# Patient Record
Sex: Female | Born: 1955
Health system: Southern US, Community
[De-identification: ages and names within clinical notes are randomized; demographics above are authoritative.]

## PROBLEM LIST (undated history)

## (undated) DIAGNOSIS — F32A Depression, unspecified: Secondary | ICD-10-CM

## (undated) DIAGNOSIS — M1990 Unspecified osteoarthritis, unspecified site: Secondary | ICD-10-CM

## (undated) DIAGNOSIS — D649 Anemia, unspecified: Secondary | ICD-10-CM

## (undated) DIAGNOSIS — Q2112 Patent foramen ovale: Secondary | ICD-10-CM

## (undated) DIAGNOSIS — M797 Fibromyalgia: Secondary | ICD-10-CM

## (undated) DIAGNOSIS — E559 Vitamin D deficiency, unspecified: Secondary | ICD-10-CM

## (undated) DIAGNOSIS — Z789 Other specified health status: Secondary | ICD-10-CM

## (undated) DIAGNOSIS — T7840XA Allergy, unspecified, initial encounter: Secondary | ICD-10-CM

## (undated) DIAGNOSIS — G43909 Migraine, unspecified, not intractable, without status migrainosus: Secondary | ICD-10-CM

## (undated) DIAGNOSIS — F329 Major depressive disorder, single episode, unspecified: Secondary | ICD-10-CM

## (undated) DIAGNOSIS — K635 Polyp of colon: Secondary | ICD-10-CM

## (undated) DIAGNOSIS — Z5189 Encounter for other specified aftercare: Secondary | ICD-10-CM

## (undated) DIAGNOSIS — G709 Myoneural disorder, unspecified: Secondary | ICD-10-CM

## (undated) DIAGNOSIS — F419 Anxiety disorder, unspecified: Secondary | ICD-10-CM

## (undated) DIAGNOSIS — Q211 Atrial septal defect: Secondary | ICD-10-CM

## (undated) DIAGNOSIS — R112 Nausea with vomiting, unspecified: Secondary | ICD-10-CM

## (undated) DIAGNOSIS — Z9889 Other specified postprocedural states: Secondary | ICD-10-CM

## (undated) DIAGNOSIS — E079 Disorder of thyroid, unspecified: Secondary | ICD-10-CM

## (undated) HISTORY — DX: Anemia, unspecified: D64.9

## (undated) HISTORY — DX: Nausea with vomiting, unspecified: Z98.890

## (undated) HISTORY — DX: Polyp of colon: K63.5

## (undated) HISTORY — DX: Allergy, unspecified, initial encounter: T78.40XA

## (undated) HISTORY — DX: Nausea with vomiting, unspecified: R11.2

## (undated) HISTORY — DX: Fibromyalgia: M79.7

## (undated) HISTORY — DX: Migraine, unspecified, not intractable, without status migrainosus: G43.909

## (undated) HISTORY — PX: ABDOMINAL HYSTERECTOMY: SHX81

## (undated) HISTORY — DX: Atrial septal defect: Q21.1

## (undated) HISTORY — PX: KNEE ARTHROSCOPY: SUR90

## (undated) HISTORY — DX: Depression, unspecified: F32.A

## (undated) HISTORY — DX: Disorder of thyroid, unspecified: E07.9

## (undated) HISTORY — DX: Other specified health status: Z78.9

## (undated) HISTORY — PX: TONSILLECTOMY: SUR1361

## (undated) HISTORY — DX: Unspecified osteoarthritis, unspecified site: M19.90

## (undated) HISTORY — DX: Anxiety disorder, unspecified: F41.9

## (undated) HISTORY — DX: Myoneural disorder, unspecified: G70.9

## (undated) HISTORY — DX: Major depressive disorder, single episode, unspecified: F32.9

## (undated) HISTORY — DX: Vitamin D deficiency, unspecified: E55.9

## (undated) HISTORY — DX: Patent foramen ovale: Q21.12

## (undated) HISTORY — DX: Encounter for other specified aftercare: Z51.89

---

## 1997-07-22 ENCOUNTER — Other Ambulatory Visit: Admission: RE | Admit: 1997-07-22 | Discharge: 1997-07-22 | Payer: Self-pay | Admitting: *Deleted

## 1997-11-24 ENCOUNTER — Emergency Department (HOSPITAL_COMMUNITY): Admission: EM | Admit: 1997-11-24 | Discharge: 1997-11-24 | Payer: Self-pay

## 1998-07-12 ENCOUNTER — Emergency Department (HOSPITAL_COMMUNITY): Admission: EM | Admit: 1998-07-12 | Discharge: 1998-07-12 | Payer: Self-pay | Admitting: Emergency Medicine

## 1999-05-12 ENCOUNTER — Ambulatory Visit (HOSPITAL_COMMUNITY): Admission: RE | Admit: 1999-05-12 | Discharge: 1999-05-12 | Payer: Self-pay | Admitting: Orthopedic Surgery

## 1999-05-12 ENCOUNTER — Encounter: Payer: Self-pay | Admitting: Orthopedic Surgery

## 1999-10-16 ENCOUNTER — Other Ambulatory Visit: Admission: RE | Admit: 1999-10-16 | Discharge: 1999-10-16 | Payer: Self-pay | Admitting: *Deleted

## 2000-10-18 ENCOUNTER — Emergency Department (HOSPITAL_COMMUNITY): Admission: EM | Admit: 2000-10-18 | Discharge: 2000-10-19 | Payer: Self-pay | Admitting: Emergency Medicine

## 2000-12-17 ENCOUNTER — Other Ambulatory Visit: Admission: RE | Admit: 2000-12-17 | Discharge: 2000-12-17 | Payer: Self-pay | Admitting: *Deleted

## 2001-04-29 ENCOUNTER — Encounter: Admission: RE | Admit: 2001-04-29 | Discharge: 2001-04-29 | Payer: Self-pay | Admitting: *Deleted

## 2003-04-15 ENCOUNTER — Emergency Department (HOSPITAL_COMMUNITY): Admission: EM | Admit: 2003-04-15 | Discharge: 2003-04-15 | Payer: Self-pay | Admitting: Emergency Medicine

## 2004-08-26 ENCOUNTER — Encounter: Admission: RE | Admit: 2004-08-26 | Discharge: 2004-08-26 | Payer: Self-pay | Admitting: Neurology

## 2005-07-04 ENCOUNTER — Other Ambulatory Visit: Admission: RE | Admit: 2005-07-04 | Discharge: 2005-07-04 | Payer: Self-pay | Admitting: *Deleted

## 2006-08-13 ENCOUNTER — Other Ambulatory Visit: Admission: RE | Admit: 2006-08-13 | Discharge: 2006-08-13 | Payer: Self-pay | Admitting: *Deleted

## 2008-02-26 HISTORY — PX: ENDOVENOUS ABLATION SAPHENOUS VEIN W/ LASER: SUR449

## 2008-07-01 ENCOUNTER — Encounter: Payer: Self-pay | Admitting: Cardiology

## 2008-07-13 ENCOUNTER — Ambulatory Visit: Payer: Self-pay

## 2008-07-13 ENCOUNTER — Ambulatory Visit: Payer: Self-pay | Admitting: Cardiology

## 2008-07-13 ENCOUNTER — Encounter: Payer: Self-pay | Admitting: Internal Medicine

## 2008-07-13 DIAGNOSIS — H53129 Transient visual loss, unspecified eye: Secondary | ICD-10-CM | POA: Insufficient documentation

## 2008-11-14 ENCOUNTER — Encounter: Payer: Self-pay | Admitting: Internal Medicine

## 2009-03-13 ENCOUNTER — Encounter (INDEPENDENT_AMBULATORY_CARE_PROVIDER_SITE_OTHER): Payer: Self-pay | Admitting: *Deleted

## 2009-03-22 ENCOUNTER — Encounter (INDEPENDENT_AMBULATORY_CARE_PROVIDER_SITE_OTHER): Payer: Self-pay | Admitting: *Deleted

## 2009-03-27 ENCOUNTER — Ambulatory Visit: Payer: Self-pay | Admitting: Internal Medicine

## 2009-04-06 ENCOUNTER — Ambulatory Visit: Payer: Self-pay | Admitting: Internal Medicine

## 2009-04-06 HISTORY — PX: COLONOSCOPY: SHX174

## 2009-04-07 ENCOUNTER — Encounter: Payer: Self-pay | Admitting: Internal Medicine

## 2009-08-09 ENCOUNTER — Encounter: Admission: RE | Admit: 2009-08-09 | Discharge: 2009-08-09 | Payer: Self-pay | Admitting: Family Medicine

## 2009-12-13 ENCOUNTER — Encounter: Payer: Self-pay | Admitting: Internal Medicine

## 2009-12-22 ENCOUNTER — Ambulatory Visit: Payer: Self-pay | Admitting: Internal Medicine

## 2009-12-22 DIAGNOSIS — R0602 Shortness of breath: Secondary | ICD-10-CM | POA: Insufficient documentation

## 2010-01-08 ENCOUNTER — Telehealth (INDEPENDENT_AMBULATORY_CARE_PROVIDER_SITE_OTHER): Payer: Self-pay | Admitting: *Deleted

## 2010-01-09 ENCOUNTER — Ambulatory Visit (HOSPITAL_COMMUNITY): Admission: RE | Admit: 2010-01-09 | Discharge: 2010-01-09 | Payer: Self-pay | Admitting: Internal Medicine

## 2010-01-09 ENCOUNTER — Ambulatory Visit: Payer: Self-pay

## 2010-01-09 ENCOUNTER — Encounter: Payer: Self-pay | Admitting: Internal Medicine

## 2010-01-09 ENCOUNTER — Ambulatory Visit: Payer: Self-pay | Admitting: Cardiovascular Disease

## 2010-01-10 ENCOUNTER — Telehealth: Payer: Self-pay | Admitting: Internal Medicine

## 2010-03-27 NOTE — Procedures (Signed)
Summary: Colonoscopy  Patient: [name] Note: All result statuses are Final unless otherwise noted.  Tests: (1) Colonoscopy (COL)   COL Colonoscopy           DONE     [facility]     [address]     [place], [place]  [address]           COLONOSCOPY PROCEDURE REPORT           PATIENT:  Kimberly Kent, Kimberly Kent  MR#:  [id number]     BIRTHDATE:  1955-04-07, 53 yrs. old  GENDER:  female           ENDOSCOPIST:  Hedwig Morton. Juanda Chance, MD     Referred by:  Robert Bellow, M.D.           PROCEDURE DATE:  04/06/2009     PROCEDURE:  Colonoscopy 19147     ASA CLASS:  Class I     INDICATIONS:  Routine Risk Screening           MEDICATIONS:   Versed 10 mg, Fentanyl 100 mcg, Benadryl 12.5 mg           DESCRIPTION OF PROCEDURE:   After the risks benefits and     alternatives of the procedure were thoroughly explained, informed     consent was obtained.  Digital rectal exam was performed and     revealed no rectal masses.   The LB CF-[id number] endoscope     was introduced through the anus and advanced to the cecum, which     was identified by both the appendix and ileocecal valve, without     limitations.  The quality of the prep was good, using MiraLax.     The instrument was then slowly withdrawn as the colon was fully     examined.     <<PROCEDUREIMAGES>>           FINDINGS:  A diminutive polyp was found in the sigmoid colon. at     20 cm 2 mm polyp The polyp was removed using cold biopsy forceps     (see image4).  This was otherwise a normal examination of the     colon (see image5, image3, image2, and image1).   Retroflexed     views in the rectum revealed no abnormalities.    The scope was     then withdrawn from the patient and the procedure completed.           COMPLICATIONS:  None           ENDOSCOPIC IMPRESSION:     1) Diminutive polyp in the sigmoid colon     2) Otherwise normal examination     RECOMMENDATIONS:     1) Await pathology results     2) high fiber diet        REPEAT EXAM:  In 10 year(s) for.           ______________________________     Hedwig Morton. Juanda Chance, MD           CC:           n.     eSIGNED:   Hedwig Morton. Brodie at 04/06/2009 10:42 AM           Iona Coach, [phone]  Note: An exclamation mark (!) indicates a result that was not dispersed into the flowsheet. Document Creation Date: 04/06/2009 10:43 AM _______________________________________________________________________  (1) Order result status: Final Collection or observation date-time: 04/06/2009  10:34 Requested date-time:  Receipt date-time:  Reported date-time:  Referring Physician:   Ordering Physician: Lina Sar [phone]) Specimen Source:  Source: [name] Order Number: [id number] Lab site:   Appended Document: Colonoscopy     Procedures Next Due Date:    Colonoscopy: 03/2019

## 2010-03-27 NOTE — Progress Notes (Signed)
Summary: echo results  Phone Note Call from Patient   Caller: Patient  [phone] Reason for Call: Talk to Nurse, Lab or Test Results Summary of Call: pt calling for echo results-pls call  Initial call taken by: Glynda Jaeger,  January 10, 2010 3:52 PM  Follow-up for Phone Call        adv pt of normal results and that Dr. Tenny Craw has not reviewed results yet. Follow-up by: Claris Gladden RN,  January 10, 2010 4:20 PM

## 2010-03-27 NOTE — Letter (Signed)
Summary: [facility] Instructions  [facility]  [address] [place], [place] [address]   Phone: [phone]  Fax: [phone]       CLARIZA SICKMAN    07-24-1955    MRN: [id number]       Procedure Day /Date:  Thursday 04/06/3009     Arrival Time:   8:30 am     Procedure Time:  9:30 am     Location of Procedure:                    _x _  [facility] (4th Floor)   PREPARATION FOR COLONOSCOPY WITH MIRALAX  Starting 5 days prior to your procedure Saturday 2/5 do not eat nuts, seeds, popcorn, corn, beans, peas,  salads, or any raw vegetables.  Do not take any fiber supplements (e.g. Metamucil, Citrucel, and Benefiber). ____________________________________________________________________________________________________   THE DAY BEFORE YOUR PROCEDURE         DATE: Wednesday 2/9  1   Drink clear liquids the entire day-NO SOLID FOOD  2   Do not drink anything colored red or purple.  Avoid juices with pulp.  No orange juice.  3   Drink at least 64 oz. (8 glasses) of fluid/clear liquids during the day to prevent dehydration and help the prep work efficiently.  CLEAR LIQUIDS INCLUDE: Water Jello Ice Popsicles Tea (sugar ok, no milk/cream) Powdered fruit flavored drinks Coffee (sugar ok, no milk/cream) Gatorade Juice: apple, white grape, white cranberry  Lemonade Clear bullion, consomm, broth Carbonated beverages (any kind) Strained chicken noodle soup Hard Candy  4   Mix the entire bottle of Miralax with 64 oz. of Gatorade/Powerade in the morning and put in the refrigerator to chill.  5   At 3:00 pm take 2 Dulcolax/Bisacodyl tablets.  6   At 4:30 pm take one Reglan/Metoclopramide tablet.  7  Starting at 5:00 pm drink one 8 oz glass of the Miralax mixture every 15-20 minutes until you have finished drinking the entire 64 oz.  You should finish drinking prep around 7:30 or 8:00 pm.  8   If you are nauseated, you may take the 2nd Reglan/Metoclopramide  tablet at 6:30 pm.        9    At 8:00 pm take 2 more DULCOLAX/Bisacodyl tablets.     THE DAY OF YOUR PROCEDURE      DATE:  Thursday 2/10  You may drink clear liquids until 7:30 am  (2 HOURS BEFORE PROCEDURE).   MEDICATION INSTRUCTIONS  Unless otherwise instructed, you should take regular prescription medications with a small sip of water as early as possible the morning of your procedure.         OTHER INSTRUCTIONS  You will need a responsible adult at least 53 years of age to accompany you and drive you home.   This person must remain in the waiting room during your procedure.  Wear loose fitting clothing that is easily removed.  Leave jewelry and other valuables at home.  However, you may wish to bring a book to read or an iPod/MP3 player to listen to music as you wait for your procedure to start.  Remove all body piercing jewelry and leave at home.  Total time from sign-in until discharge is approximately 2-3 hours.  You should go home directly after your procedure and rest.  You can resume normal activities the day after your procedure.  The day of your procedure you should not:   Drive  Make legal decisions   Operate machinery   Drink alcohol   Return to work  You will receive specific instructions about eating, activities and medications before you leave.   The above instructions have been reviewed and explained to me by   Ezra Sites RN  March 27, 2009 2:48 PM     I fully understand and can verbalize these instructions _____________________________ Date _______

## 2010-03-27 NOTE — Assessment & Plan Note (Signed)
Summary: ec6/sob   History of Present Illness: Patient is a  55 year old wh was referred for evaluation of SOB. the patient has no known CAD.  She says over the last several months she has noticed increased SOB with activity, decreased exercise tolerance.  She is active as a cyclist and has not been able to bike as long without feeling winded.  She denies chest pains.  Does note about a 30 lb. increase in wt over the past 18 months. Notes occasional selling in R shoulder and R arm.  Current Medications (verified): 1)  Prozac 10 Mg Caps (Fluoxetine Hcl) .... Daily 2)  Vitamin D 1000 Unit  Tabs (Cholecalciferol) .... Daily 3)  Multivitamins   Tabs (Multiple Vitamin) .... Daily 4)  Synthroid 75 Mcg Tabs (Levothyroxine Sodium) .... Once Daily 5)  Celebrex 200 Mg Caps (Celecoxib) .... As Needed  Allergies: 1)  ! Codeine 2)  ! Wellbutrin  Past History:  Past Surgical History: Last updated: 12/21/2009 Knee surgery Partial Hysterectomy  Family History: Last updated: 07/13/2008 negative for cardiovascular disease  Social History: Last updated: 07/13/2008 She works as a Software engineer for [employer]. She is single. She is an avid biker. She does not smoke.  Past Medical History: Hypothyroid. Depression. DJD  Review of Systems       ALl systems reviewed.  Neg to the above problem excep as noted above.  Vital Signs:  Patient profile:   55 year old female Height:      66 inches Weight:      193 pounds BMI:     31.26 Pulse rate:   64 / minute BP sitting:   106 / 70  (left arm)  Vitals Entered By: Laurance Flatten CMA (December 22, 2009 2:06 PM)  Physical Exam  Additional Exam:  patient is in NAD. HEENT:  Normocephalic, atraumatic. EOMI, PERRLA.  Neck: JVP is normal. No thyromegaly. No bruits.  Lungs: clear to auscultation. No rales no wheezes.  Heart: Regular rate and rhythm. Normal S1, S2. No S3.   No significant murmurs. PMI not displaced.  Abdomen:  Supple, nontender.  Normal bowel sounds. No masses. No hepatomegaly.  Extremities:   Good distal pulses throughout. No lower extremity edema.  Musculoskeletal :moving all extremities.  Neuro:   alert and oriented x3.    EKG  Procedure date:  12/13/2009  Findings:      NSR.  64 bpm.    Impression & Recommendations:  Problem # 1:  SHORTNESS OF BREATH (ICD-786.05) Patient with no known CAD.  Is moderately active.  Now with increased dyspnea with exertion.   I would recommend an echo and a stress echo to evaluate.  Continue activities as tolerated.  Discussed wt loss.  Other Orders: Echocardiogram (Echo) Stress Echo (Stress Echo)  Patient Instructions: 1)  Your physician has requested that you have a stress echocardiogram. For further information please visit [email or web address]  Please follow instruction sheet as given. 2)  Your physician has requested that you have an echocardiogram.  Echocardiography is a painless test that uses sound waves to create images of your heart. It provides your doctor with information about the size and shape of your heart and how well your heart's chambers and valves are working.  This procedure takes approximately one hour. There are no restrictions for this procedure.  Appended Document: ec6/sob Dyslipidemia:  :D: 126, HDL 60.  Discussed diet.  Should f/u with primary MD  Allergies:  Codeine:  sick;  Wellbutrin:  hives

## 2010-03-27 NOTE — Letter (Signed)
Summary: Previsit letter  [facility]  [address] [place], [place] [address]   Phone: [phone]  Fax: [phone]       03/13/2009 MRN: [id number]  [facility] [address] [place], [place]  [address]  Dear Ms. Hain,  Welcome to the Gastroenterology Division at [facility].    You are scheduled to see a nurse for your pre-procedure visit on 03-27-09 at 2:30p.m. on the 3rd floor at [facility], [address]  We ask that you try to arrive at our office 15 minutes prior to your appointment time to allow for check-in.  Your nurse visit will consist of discussing your medical and surgical history, your immediate family medical history, and your medications.    Please bring a complete list of all your medications or, if you prefer, bring the medication bottles and we will list them.  We will need to be aware of both prescribed and over the counter drugs.  We will need to know exact dosage information as well.  If you are on blood thinners (Coumadin, Plavix, Aggrenox, Ticlid, etc.) please call our office today/prior to your appointment, as we need to consult with your physician about holding your medication.   Please be prepared to read and sign documents such as consent forms, a financial agreement, and acknowledgement forms.  If necessary, and with your consent, a friend or relative is welcome to sit-in on the nurse visit with you.  Please bring your insurance card so that we may make a copy of it.  If your insurance requires a referral to see a specialist, please bring your referral form from your primary care physician.  No co-pay is required for this nurse visit.     If you cannot keep your appointment, please call [phone] to cancel or reschedule prior to your appointment date.  This allows [place] the opportunity to schedule an appointment for another patient in need of care.    Thank you for choosing [facility] for your medical needs.  We  appreciate the opportunity to care for you.  Please visit [place] at our website  to learn more about our practice.                     Sincerely.                                                                                                                   The Gastroenterology Division

## 2010-03-27 NOTE — Miscellaneous (Signed)
Summary: [facility]  Clinical Lists Changes  Medications: Added new medication of MIRALAX   POWD (POLYETHYLENE GLYCOL 3350) As per prep  instructions. - Signed Added new medication of REGLAN 10 MG  TABS (METOCLOPRAMIDE HCL) As per prep instructions. - Signed Added new medication of DULCOLAX 5 MG  TBEC (BISACODYL) Day before procedure take 2 at 3pm and 2 at 8pm. - Signed Rx of MIRALAX   POWD (POLYETHYLENE GLYCOL 3350) As per prep  instructions.;  #255gm x 0;  Signed;  Entered by: Ezra Sites RN;  Authorized by: Hart Carwin MD;  Method used: [facility] [phone]*, [address], [place], [place], [place]  [address], Ph: [phone], Fax: [phone] Rx of REGLAN 10 MG  TABS (METOCLOPRAMIDE HCL) As per prep instructions.;  #2 x 0;  Signed;  Entered by: Ezra Sites RN;  Authorized by: Hart Carwin MD;  Method used: Electronically to [facility] [phone]*, [address], [place], [place], [place]  [address], Ph: [phone], Fax: [phone] Rx of DULCOLAX 5 MG  TBEC (BISACODYL) Day before procedure take 2 at 3pm and 2 at 8pm.;  #4 x 0;  Signed;  Entered by: Ezra Sites RN;  Authorized by: Hart Carwin MD;  Method used: Electronically to [facility] [phone]*, [address], [place], [place], [place]  [address], Ph: [phone], Fax: [phone] Allergies: Added new allergy or adverse reaction of CODEINE Added new allergy or adverse reaction of WELLBUTRIN Observations: Added new observation of NKA: F (03/27/2009 14:16)    Prescriptions: DULCOLAX 5 MG  TBEC (BISACODYL) Day before procedure take 2 at 3pm and 2 at 8pm.  #4 x 0   Entered by:   Ezra Sites RN   Authorized by:   Hart Carwin MD   Signed by:   Ezra Sites RN on 03/27/2009   Method used:   Electronically to        [facility] [phone]* (retail)       [address]       [place], [place]  [address]       Ph: [phone]       Fax: [phone]  RxID:   [id number] REGLAN 10 MG  TABS (METOCLOPRAMIDE HCL) As per prep instructions.  #2 x 0   Entered by:   Ezra Sites RN   Authorized by:   Hart Carwin MD   Signed by:   Ezra Sites RN on 03/27/2009   Method used:   Electronically to        [facility] [phone]* (retail)       [address]       [place], [place]  [address]       Ph: [phone]       Fax: [phone]   RxID:   [id number] MIRALAX   POWD (POLYETHYLENE GLYCOL 3350) As per prep  instructions.  #255gm x 0   Entered by:   Ezra Sites RN   Authorized by:   Hart Carwin MD   Signed by:   Ezra Sites RN on 03/27/2009   Method used:   Electronically to        [facility] [phone]* (retail)       [address]       [place], [place]  [address]  Ph: [phone]       Fax: [phone]   RxID:   [id number]

## 2010-03-27 NOTE — Progress Notes (Signed)
Summary: Echo pre procedure  Phone Note Outgoing Call Call back at [facility] Phone [phone]   Call placed by: Rea College, CMA,  January 08, 2010 3:12 PM Call placed to: Patient Summary of Call: Kimberly Kent left message with instructions for stress echo on Tuesday, 01/09/10.

## 2010-03-27 NOTE — Letter (Signed)
Summary: Patient Notice- Polyp Results  [facility]  [address] [place], [place] [address]   Phone: [phone]  Fax: [phone]        April 07, 2009 MRN: [id number]    [facility] [address] [place], [place]  [address]    Dear Ms. Caplin,  I am pleased to inform you that the colon polyp(s) removed during your recent colonoscopy was (were) found to be benign (no cancer detected) upon pathologic examination.The polyp was hyperplastic ( not premalignant)  I recommend you have a repeat colonoscopy examination in _10 years to look for recurrent polyps, as having colon polyps increases your risk for having recurrent polyps or even colon cancer in the future.  Should you develop new or worsening symptoms of abdominal pain, bowel habit changes or bleeding from the rectum or bowels, please schedule an evaluation with either your primary care physician or with me.  Additional information/recommendations:  _x_ No further action with gastroenterology is needed at this time. Please      follow-up with your primary care physician for your other healthcare      needs.  __ Please call [phone] to schedule a return visit to review your      situation.  __ Please keep your follow-up visit as already scheduled.  __ Continue treatment plan as outlined the day of your exam.  Please call us if you are having persistent problems or have questions about your condition that have not been fully answered at this time.  Sincerely,  Hart [place] MD  This letter has been electronically signed by your physician.  Appended Document: Patient Notice- Polyp Results Letter mailed 2.16.11

## 2010-06-08 ENCOUNTER — Other Ambulatory Visit: Payer: Self-pay | Admitting: Physical Medicine and Rehabilitation

## 2010-06-08 DIAGNOSIS — M545 Low back pain, unspecified: Secondary | ICD-10-CM

## 2010-06-09 ENCOUNTER — Ambulatory Visit
Admission: RE | Admit: 2010-06-09 | Discharge: 2010-06-09 | Disposition: A | Discharge status: Home / Self Care | Payer: BLUE CROSS/BLUE SHIELD | Source: Ambulatory Visit | Attending: Physical Medicine and Rehabilitation | Admitting: Physical Medicine and Rehabilitation

## 2010-06-09 DIAGNOSIS — M545 Low back pain, unspecified: Secondary | ICD-10-CM

## 2010-09-28 ENCOUNTER — Other Ambulatory Visit: Payer: Self-pay | Admitting: Internal Medicine

## 2010-10-04 ENCOUNTER — Ambulatory Visit
Admission: RE | Admit: 2010-10-04 | Discharge: 2010-10-04 | Disposition: A | Discharge status: Home / Self Care | Payer: BLUE CROSS/BLUE SHIELD | Source: Ambulatory Visit | Attending: Internal Medicine | Admitting: Internal Medicine

## 2010-10-04 MED ORDER — IOHEXOL 300 MG/ML  SOLN
100.0000 mL | Freq: Once | INTRAMUSCULAR | Status: AC | PRN
  Administered 2010-10-04: 100 mL via INTRAVENOUS

## 2010-10-22 ENCOUNTER — Other Ambulatory Visit: Payer: Self-pay | Admitting: Gynecology

## 2010-12-17 ENCOUNTER — Other Ambulatory Visit: Payer: Self-pay | Admitting: Dermatology

## 2011-01-02 ENCOUNTER — Other Ambulatory Visit: Payer: Self-pay | Admitting: Gynecology

## 2011-02-21 ENCOUNTER — Ambulatory Visit: Payer: BLUE CROSS/BLUE SHIELD

## 2011-04-04 ENCOUNTER — Other Ambulatory Visit: Payer: Self-pay | Admitting: Physician Assistant

## 2011-04-04 MED ORDER — FLUOXETINE HCL 10 MG PO TABS: 10.0000 mg | ORAL_TABLET | ORAL | Status: DC

## 2011-04-22 ENCOUNTER — Other Ambulatory Visit: Payer: Self-pay | Admitting: Dermatology

## 2011-05-04 ENCOUNTER — Other Ambulatory Visit: Payer: Self-pay | Admitting: Internal Medicine

## 2011-05-05 NOTE — Telephone Encounter (Signed)
Partial fill synthroid needs OV

## 2011-05-20 ENCOUNTER — Encounter: Payer: Self-pay | Admitting: Internal Medicine

## 2011-05-20 ENCOUNTER — Ambulatory Visit (INDEPENDENT_AMBULATORY_CARE_PROVIDER_SITE_OTHER): Payer: PRIVATE HEALTH INSURANCE | Admitting: Internal Medicine

## 2011-05-20 DIAGNOSIS — M79604 Pain in right leg: Secondary | ICD-10-CM

## 2011-05-20 DIAGNOSIS — R5381 Other malaise: Secondary | ICD-10-CM

## 2011-05-20 DIAGNOSIS — R5383 Other fatigue: Secondary | ICD-10-CM

## 2011-05-20 DIAGNOSIS — E039 Hypothyroidism, unspecified: Secondary | ICD-10-CM

## 2011-05-20 DIAGNOSIS — M545 Low back pain: Secondary | ICD-10-CM

## 2011-05-20 DIAGNOSIS — G43009 Migraine without aura, not intractable, without status migrainosus: Secondary | ICD-10-CM

## 2011-05-20 DIAGNOSIS — M7989 Other specified soft tissue disorders: Secondary | ICD-10-CM

## 2011-05-20 DIAGNOSIS — M25429 Effusion, unspecified elbow: Secondary | ICD-10-CM

## 2011-05-20 DIAGNOSIS — R05 Cough: Secondary | ICD-10-CM

## 2011-05-20 DIAGNOSIS — R209 Unspecified disturbances of skin sensation: Secondary | ICD-10-CM

## 2011-05-20 DIAGNOSIS — R059 Cough, unspecified: Secondary | ICD-10-CM

## 2011-05-20 DIAGNOSIS — G43109 Migraine with aura, not intractable, without status migrainosus: Secondary | ICD-10-CM | POA: Insufficient documentation

## 2011-05-20 LAB — CBC WITH DIFFERENTIAL/PLATELET
Basophils Relative: 0 % (ref 0–1)
Eosinophils Absolute: 0.1 10*3/uL (ref 0.0–0.7)
HCT: 38.4 % (ref 36.0–46.0)
Hemoglobin: 12.5 g/dL (ref 12.0–15.0)
Lymphs Abs: 1.6 10*3/uL (ref 0.7–4.0)
MCH: 30.2 pg (ref 26.0–34.0)
MCHC: 32.6 g/dL (ref 30.0–36.0)
Monocytes Absolute: 0.3 10*3/uL (ref 0.1–1.0)
Monocytes Relative: 6 % (ref 3–12)
RBC: 4.14 MIL/uL (ref 3.87–5.11)

## 2011-05-20 LAB — COMPREHENSIVE METABOLIC PANEL
ALT: 14 U/L (ref 0–35)
CO2: 30 mEq/L (ref 19–32)
Calcium: 9.7 mg/dL (ref 8.4–10.5)
Chloride: 104 mEq/L (ref 96–112)
Creat: 0.61 mg/dL (ref 0.50–1.10)
Glucose, Bld: 85 mg/dL (ref 70–99)
Sodium: 143 mEq/L (ref 135–145)
Total Bilirubin: 0.5 mg/dL (ref 0.3–1.2)
Total Protein: 6.8 g/dL (ref 6.0–8.3)

## 2011-05-20 LAB — TSH: TSH: 0.768 u[IU]/mL (ref 0.350–4.500)

## 2011-05-20 MED ORDER — METHYLPREDNISOLONE ACETATE 80 MG/ML IJ SUSP
80.0000 mg | Freq: Once | INTRAMUSCULAR | Status: AC
  Administered 2011-05-20: 80 mg via INTRAMUSCULAR

## 2011-05-20 MED ORDER — AZITHROMYCIN 250 MG PO TABS: ORAL_TABLET | ORAL | Status: AC

## 2011-05-20 MED ORDER — SYNTHROID 75 MCG PO TABS: 75.0000 ug | ORAL_TABLET | Freq: Every day | ORAL | Status: DC

## 2011-05-20 NOTE — Progress Notes (Signed)
  Subjective:    Patient ID: Kimberly Kent, female    DOB: [birth year], 56 y.o.   MRN: [id number]  HPI LBP right rad Cough with green sputum Fatigue-- needs thyroid ck Swelling and numbness arms Migrain     Review of Systems Stable otherwise    Objective:   Physical Exam        Assessment & Plan:  See Dr. Sandria Manly about numbness and migrain

## 2011-05-23 ENCOUNTER — Encounter: Payer: Self-pay | Admitting: Radiology

## 2011-09-02 ENCOUNTER — Telehealth: Payer: Self-pay

## 2011-09-02 DIAGNOSIS — G43909 Migraine, unspecified, not intractable, without status migrainosus: Secondary | ICD-10-CM

## 2011-09-02 NOTE — Telephone Encounter (Signed)
Can we do this ?

## 2011-09-02 NOTE — Telephone Encounter (Signed)
Referral made to [facility]

## 2011-09-02 NOTE — Telephone Encounter (Signed)
Pt would like referral to [facility] has seen dr guest in the past for her headaches.

## 2011-09-03 NOTE — Telephone Encounter (Signed)
Pt notified. Jenna 

## 2011-09-15 ENCOUNTER — Other Ambulatory Visit: Payer: Self-pay | Admitting: Physician Assistant

## 2011-10-23 ENCOUNTER — Other Ambulatory Visit: Payer: Self-pay | Admitting: Gynecology

## 2011-12-05 ENCOUNTER — Other Ambulatory Visit: Payer: Self-pay | Admitting: Physician Assistant

## 2011-12-12 ENCOUNTER — Other Ambulatory Visit: Payer: Self-pay | Admitting: Physician Assistant

## 2012-01-09 ENCOUNTER — Other Ambulatory Visit: Payer: Self-pay | Admitting: Physician Assistant

## 2012-01-09 ENCOUNTER — Telehealth: Payer: Self-pay

## 2012-01-09 MED ORDER — FLUOXETINE HCL 10 MG PO CAPS: 10.0000 mg | ORAL_CAPSULE | Freq: Every day | ORAL | Status: DC

## 2012-01-09 NOTE — Telephone Encounter (Signed)
Patient advised.

## 2012-01-09 NOTE — Telephone Encounter (Signed)
Rx sent to pharmacy. Must keep appointment with Dr. Perrin Maltese.

## 2012-01-09 NOTE — Telephone Encounter (Signed)
PATIENT STATES SHE NEEDS TO GET A FEW PILLS CALLED INTO HER PHARMACY. SHE NEEDS TO HAVE HER FLUOXETINE HCL 10MG . WE COULD  NOT GET HER IN FOR HER APPOINTMENT TO SEE DR. Perrin Maltese UNTIL THE END OF THIS MONTH, BUT SHE ONLY HAS 3 PILLS LEFT. SHE JUST NEEDS ENOUGH TO MAKE IT UNTIL THEN. BEST PHONE [phone]   [facility])   MBC

## 2012-01-20 ENCOUNTER — Encounter: Payer: Self-pay | Admitting: Internal Medicine

## 2012-01-20 ENCOUNTER — Ambulatory Visit: Payer: PRIVATE HEALTH INSURANCE | Admitting: Internal Medicine

## 2012-01-20 MED ORDER — SYNTHROID 75 MCG PO TABS: 75.0000 ug | ORAL_TABLET | Freq: Every day | ORAL | Status: DC

## 2012-01-20 MED ORDER — FLUOXETINE HCL 10 MG PO CAPS: 10.0000 mg | ORAL_CAPSULE | Freq: Every day | ORAL | Status: DC

## 2012-01-20 NOTE — Progress Notes (Signed)
  Subjective:    Patient ID: Kimberly Kent, female    DOB: October 21, 1955, 56 y.o.   MRN: [id number]  HPI    Review of Systems     Objective:   Physical Exam        Assessment & Plan:  RF meds only, had to leave

## 2012-02-03 ENCOUNTER — Other Ambulatory Visit: Payer: Self-pay | Admitting: Physician Assistant

## 2012-03-05 ENCOUNTER — Ambulatory Visit (INDEPENDENT_AMBULATORY_CARE_PROVIDER_SITE_OTHER): Payer: PRIVATE HEALTH INSURANCE | Admitting: Internal Medicine

## 2012-03-05 VITALS — BP 106/71 | HR 74 | Temp 98.0°F | Resp 16 | Ht 66.0 in | Wt 197.6 lb

## 2012-03-05 DIAGNOSIS — F419 Anxiety disorder, unspecified: Secondary | ICD-10-CM

## 2012-03-05 DIAGNOSIS — F39 Unspecified mood [affective] disorder: Secondary | ICD-10-CM

## 2012-03-05 DIAGNOSIS — E039 Hypothyroidism, unspecified: Secondary | ICD-10-CM

## 2012-03-05 DIAGNOSIS — F411 Generalized anxiety disorder: Secondary | ICD-10-CM

## 2012-03-05 DIAGNOSIS — Z0000 Encounter for general adult medical examination without abnormal findings: Secondary | ICD-10-CM

## 2012-03-05 DIAGNOSIS — M2550 Pain in unspecified joint: Secondary | ICD-10-CM

## 2012-03-05 LAB — POCT UA - MICROSCOPIC ONLY
Bacteria, U Microscopic: NEGATIVE
Casts, Ur, LPF, POC: NEGATIVE
Crystals, Ur, HPF, POC: NEGATIVE
Mucus, UA: NEGATIVE
Yeast, UA: NEGATIVE

## 2012-03-05 LAB — POCT CBC
Lymph, poc: 1.7 (ref 0.6–3.4)
MCH, POC: 30.2 pg (ref 27–31.2)
MCHC: 32 g/dL (ref 31.8–35.4)
MCV: 94.5 fL (ref 80–97)
MID (cbc): 0.5 (ref 0–0.9)
MPV: 8.2 fL (ref 0–99.8)
POC LYMPH PERCENT: 32.9 %L (ref 10–50)
POC MID %: 8.5 %M (ref 0–12)
Platelet Count, POC: 314 10*3/uL (ref 142–424)
WBC: 5.3 10*3/uL (ref 4.6–10.2)

## 2012-03-05 LAB — POCT URINALYSIS DIPSTICK
Bilirubin, UA: NEGATIVE
Ketones, UA: NEGATIVE
Leukocytes, UA: NEGATIVE
Nitrite, UA: NEGATIVE
Protein, UA: NEGATIVE
pH, UA: 8

## 2012-03-05 LAB — COMPREHENSIVE METABOLIC PANEL
ALT: 15 U/L (ref 0–35)
AST: 19 U/L (ref 0–37)
Alkaline Phosphatase: 69 U/L (ref 39–117)
Creat: 0.68 mg/dL (ref 0.50–1.10)
Sodium: 138 mEq/L (ref 135–145)
Total Bilirubin: 0.7 mg/dL (ref 0.3–1.2)
Total Protein: 6.9 g/dL (ref 6.0–8.3)

## 2012-03-05 LAB — LIPID PANEL
Cholesterol: 239 mg/dL — ABNORMAL HIGH (ref 0–200)
LDL Cholesterol: 164 mg/dL — ABNORMAL HIGH (ref 0–99)
Total CHOL/HDL Ratio: 3.9 Ratio
VLDL: 14 mg/dL (ref 0–40)

## 2012-03-05 LAB — VITAMIN D 25 HYDROXY (VIT D DEFICIENCY, FRACTURES): Vit D, 25-Hydroxy: 24 ng/mL — ABNORMAL LOW (ref 30–89)

## 2012-03-05 MED ORDER — SYNTHROID 75 MCG PO TABS: 75.0000 ug | ORAL_TABLET | Freq: Every day | ORAL | Status: DC

## 2012-03-05 MED ORDER — FLUOXETINE HCL 10 MG PO CAPS: 10.0000 mg | ORAL_CAPSULE | Freq: Every day | ORAL | Status: DC

## 2012-03-05 NOTE — Progress Notes (Signed)
Subjective:    Patient ID: Kimberly Kent, female    DOB: Jul 05, 1955, 57 y.o.   MRN: [id number]  HPI CPE today Feels good , no changes  Review of Systems  Constitutional: Negative.   HENT: Negative.   Eyes: Negative.   Respiratory: Negative.   Cardiovascular: Negative.   Gastrointestinal: Negative.   Genitourinary: Negative.   Musculoskeletal: Positive for back pain and arthralgias.  Neurological: Positive for dizziness.  Hematological: Negative.   Psychiatric/Behavioral: Negative.        Objective:   Physical Exam  Vitals reviewed. Constitutional: She is oriented to person, place, and time. She appears well-developed and well-nourished.  HENT:  Head: Normocephalic.  Right Ear: External ear normal.  Left Ear: External ear normal.  Nose: Nose normal.  Mouth/Throat: Oropharynx is clear and moist.  Eyes: EOM are normal. Pupils are equal, round, and reactive to light. No scleral icterus.  Neck: Normal range of motion. Neck supple. No thyromegaly present.  Cardiovascular: Normal rate, normal heart sounds and intact distal pulses.   Pulmonary/Chest: Effort normal.  Abdominal: Soft. She exhibits no mass. There is no tenderness.  Musculoskeletal: Normal range of motion. She exhibits tenderness.  Neurological: She is alert and oriented to person, place, and time. She has normal reflexes. No cranial nerve deficit. She exhibits normal muscle tone. Coordination normal.  Skin: No rash noted.  Psychiatric: She has a normal mood and affect. Her behavior is normal. Judgment and thought content normal.     Results for orders placed in visit on 03/05/12  POCT CBC      Component Value Range   WBC 5.3  4.6 - 10.2 K/uL   Lymph, poc 1.7  0.6 - 3.4   POC LYMPH PERCENT 32.9  10 - 50 %L   MID (cbc) 0.5  0 - 0.9   POC MID % 8.5  0 - 12 %M   POC Granulocyte 3.1  2 - 6.9   Granulocyte percent 58.6  37 - 80 %G   RBC 4.37  4.04 - 5.48 M/uL   Hemoglobin 13.2  12.2 - 16.2 g/dL   HCT, POC 11.9   14.7 - 47.9 %   MCV 94.5  80 - 97 fL   MCH, POC 30.2  27 - 31.2 pg   MCHC 32.0  31.8 - 35.4 g/dL   RDW, POC 82.9     Platelet Count, POC 314  142 - 424 K/uL   MPV 8.2  0 - 99.8 fL  GLUCOSE, POCT (MANUAL RESULT ENTRY)      Component Value Range   POC Glucose 80  70 - 99 mg/dl  POCT GLYCOSYLATED HEMOGLOBIN (HGB A1C)      Component Value Range   Hemoglobin A1C 5.4    POCT URINALYSIS DIPSTICK      Component Value Range   Color, UA yellow     Clarity, UA clear     Glucose, UA neg     Bilirubin, UA neg     Ketones, UA neg     Spec Grav, UA 1.020     Blood, UA trace     pH, UA 8.0     Protein, UA neg     Urobilinogen, UA 1.0     Nitrite, UA neg     Leukocytes, UA Negative    POCT UA - MICROSCOPIC ONLY      Component Value Range   WBC, Ur, HPF, POC neg     RBC, urine, microscopic 0-2  Bacteria, U Microscopic neg     Mucus, UA neg     Epithelial cells, urine per micros 0-1     Crystals, Ur, HPF, POC neg     Casts, Ur, LPF, POC neg     Yeast, UA neg     ekg ok     Assessment & Plan:  Healthy

## 2012-03-05 NOTE — Patient Instructions (Signed)

## 2012-03-11 ENCOUNTER — Telehealth: Payer: Self-pay

## 2012-03-11 NOTE — Telephone Encounter (Signed)
Pt called wanting to know lab results from last Thursday. Pt stated this is 3rd time she has called. Best # for pt [phone]

## 2012-03-12 NOTE — Telephone Encounter (Signed)
Spoke with patient and notified her of her labs. In the result note Dr. Perrin Maltese wrote labs abnormal cholesterol too high and vit D too low. She knows what to do. When I spoke with patient she said he didn't discuss what she would need to do. Does he want her to take OTC Vit D or should it be RX? As far as cholesterol she said she knew as far as exercise and diet cut out fried fatty foods but should she be doing anything else or take anything for this. She stated it has never been high or this high before. Please advise?

## 2012-03-13 NOTE — Telephone Encounter (Signed)
Pt  # disconnected. Mailed letter.

## 2012-03-13 NOTE — Telephone Encounter (Signed)
Vit D 2000units per day Cholesterol we need to consider taking medication iff weight loss does not work

## 2012-04-13 ENCOUNTER — Encounter: Payer: PRIVATE HEALTH INSURANCE | Admitting: Internal Medicine

## 2012-06-01 ENCOUNTER — Other Ambulatory Visit: Payer: Self-pay | Admitting: Internal Medicine

## 2012-06-28 ENCOUNTER — Ambulatory Visit (INDEPENDENT_AMBULATORY_CARE_PROVIDER_SITE_OTHER): Payer: PRIVATE HEALTH INSURANCE | Admitting: Internal Medicine

## 2012-06-28 ENCOUNTER — Ambulatory Visit: Payer: PRIVATE HEALTH INSURANCE

## 2012-06-28 VITALS — BP 126/85 | HR 65 | Temp 98.0°F | Resp 16 | Ht 66.0 in | Wt 198.0 lb

## 2012-06-28 DIAGNOSIS — E785 Hyperlipidemia, unspecified: Secondary | ICD-10-CM

## 2012-06-28 DIAGNOSIS — E559 Vitamin D deficiency, unspecified: Secondary | ICD-10-CM

## 2012-06-28 DIAGNOSIS — M545 Low back pain, unspecified: Secondary | ICD-10-CM

## 2012-06-28 DIAGNOSIS — R5383 Other fatigue: Secondary | ICD-10-CM

## 2012-06-28 DIAGNOSIS — F419 Anxiety disorder, unspecified: Secondary | ICD-10-CM

## 2012-06-28 DIAGNOSIS — G4700 Insomnia, unspecified: Secondary | ICD-10-CM

## 2012-06-28 DIAGNOSIS — R300 Dysuria: Secondary | ICD-10-CM

## 2012-06-28 DIAGNOSIS — E0590 Thyrotoxicosis, unspecified without thyrotoxic crisis or storm: Secondary | ICD-10-CM

## 2012-06-28 DIAGNOSIS — R209 Unspecified disturbances of skin sensation: Secondary | ICD-10-CM

## 2012-06-28 DIAGNOSIS — R202 Paresthesia of skin: Secondary | ICD-10-CM

## 2012-06-28 LAB — POCT CBC
Granulocyte percent: 58.6 % (ref 37–80)
HCT, POC: 38.9 % (ref 37.7–47.9)
Hemoglobin: 12.2 g/dL (ref 12.2–16.2)
Lymph, poc: 1.9 (ref 0.6–3.4)
MCH, POC: 29.4 pg (ref 27–31.2)
MCHC: 31.4 g/dL — AB (ref 31.8–35.4)
MCV: 93.7 fL (ref 80–97)
MID (cbc): 0.4 (ref 0–0.9)
MPV: 7.9 fL (ref 0–99.8)
POC Granulocyte: 3.3 (ref 2–6.9)
POC LYMPH PERCENT: 33.5 % (ref 10–50)
POC MID %: 7.9 % (ref 0–12)
Platelet Count, POC: 275 K/uL (ref 142–424)
RBC: 4.15 M/uL (ref 4.04–5.48)
RDW, POC: 12.2 %
WBC: 5.6 K/uL (ref 4.6–10.2)

## 2012-06-28 LAB — POCT URINALYSIS DIPSTICK
Bilirubin, UA: NEGATIVE
Glucose, UA: NEGATIVE
Ketones, UA: NEGATIVE
Leukocytes, UA: NEGATIVE
Spec Grav, UA: <=1.005

## 2012-06-28 LAB — POCT SEDIMENTATION RATE: POCT SED RATE: 45 mm/hr — AB (ref 0–22)

## 2012-06-28 LAB — POCT UA - MICROSCOPIC ONLY
Bacteria, U Microscopic: NEGATIVE
Casts, Ur, LPF, POC: NEGATIVE
Crystals, Ur, HPF, POC: NEGATIVE
Mucus, UA: NEGATIVE
WBC, Ur, HPF, POC: NEGATIVE
Yeast, UA: NEGATIVE

## 2012-06-28 LAB — VITAMIN B12: Vitamin B-12: 448 pg/mL (ref 211–911)

## 2012-06-28 LAB — TSH: TSH: 1.033 u[IU]/mL (ref 0.350–4.500)

## 2012-06-28 LAB — GLUCOSE, POCT (MANUAL RESULT ENTRY): POC Glucose: 71 mg/dl (ref 70–99)

## 2012-06-28 MED ORDER — ALPRAZOLAM 0.25 MG PO TABS: 0.2500 mg | ORAL_TABLET | Freq: Two times a day (BID) | ORAL | Status: DC | PRN

## 2012-06-28 MED ORDER — FLUOXETINE HCL 20 MG PO TABS: 20.0000 mg | ORAL_TABLET | Freq: Every day | ORAL | Status: DC

## 2012-06-28 NOTE — Patient Instructions (Signed)
Insomnia Insomnia is frequent trouble falling and/or staying asleep. Insomnia can be a long term problem or a short term problem. Both are common. Insomnia can be a short term problem when the wakefulness is related to a certain stress or worry. Long term insomnia is often related to ongoing stress during waking hours and/or poor sleeping habits. Overtime, sleep deprivation itself can make the problem worse. Every little thing feels more severe because you are overtired and your ability to cope is decreased. CAUSES   Stress, anxiety, and depression.  Poor sleeping habits.  Distractions such as TV in the bedroom.  Naps close to bedtime.  Engaging in emotionally charged conversations before bed.  Technical reading before sleep.  Alcohol and other sedatives. They may make the problem worse. They can hurt normal sleep patterns and normal dream activity.  Stimulants such as caffeine for several hours prior to bedtime.  Pain syndromes and shortness of breath can cause insomnia.  Exercise late at night.  Changing time zones may cause sleeping problems (jet lag). It is sometimes helpful to have someone observe your sleeping patterns. They should look for periods of not breathing during the night (sleep apnea). They should also look to see how long those periods last. If you live alone or observers are uncertain, you can also be observed at a sleep clinic where your sleep patterns will be professionally monitored. Sleep apnea requires a checkup and treatment. Give your caregivers your medical history. Give your caregivers observations your family has made about your sleep.  SYMPTOMS   Not feeling rested in the morning.  Anxiety and restlessness at bedtime.  Difficulty falling and staying asleep. TREATMENT   Your caregiver may prescribe treatment for an underlying medical disorders. Your caregiver can give advice or help if you are using alcohol or other drugs for self-medication. Treatment  of underlying problems will usually eliminate insomnia problems.  Medications can be prescribed for short time use. They are generally not recommended for lengthy use.  Over-the-counter sleep medicines are not recommended for lengthy use. They can be habit forming.  You can promote easier sleeping by making lifestyle changes such as:  Using relaxation techniques that help with breathing and reduce muscle tension.  Exercising earlier in the day.  Changing your diet and the time of your last meal. No night time snacks.  Establish a regular time to go to bed.  Counseling can help with stressful problems and worry.  Soothing music and white noise may be helpful if there are background noises you cannot remove.  Stop tedious detailed work at least one hour before bedtime. HOME CARE INSTRUCTIONS   Keep a diary. Inform your caregiver about your progress. This includes any medication side effects. See your caregiver regularly. Take note of:  Times when you are asleep.  Times when you are awake during the night.  The quality of your sleep.  How you feel the next day. This information will help your caregiver care for you.  Get out of bed if you are still awake after 15 minutes. Read or do some quiet activity. Keep the lights down. Wait until you feel sleepy and go back to bed.  Keep regular sleeping and waking hours. Avoid naps.  Exercise regularly.  Avoid distractions at bedtime. Distractions include watching television or engaging in any intense or detailed activity like attempting to balance the household checkbook.  Develop a bedtime ritual. Keep a familiar routine of bathing, brushing your teeth, climbing into bed at the same   time each night, listening to soothing music. Routines increase the success of falling to sleep faster.  Use relaxation techniques. This can be using breathing and muscle tension release routines. It can also include visualizing peaceful scenes. You can  also help control troubling or intruding thoughts by keeping your mind occupied with boring or repetitive thoughts like the old concept of counting sheep. You can make it more creative like imagining planting one beautiful flower after another in your backyard garden.  During your day, work to eliminate stress. When this is not possible use some of the previous suggestions to help reduce the anxiety that accompanies stressful situations. MAKE SURE YOU:   Understand these instructions.  Will watch your condition.  Will get help right away if you are not doing well or get worse. Document Released: 02/09/2000 Document Revised: 05/06/2011 Document Reviewed: 03/11/2007 [facility] Patient Information 2013 [place], [place]. Vitamin B12 Deficiency Not having enough vitamin B12 is called a deficiency. Vitamin B12 is an important vitamin. Your body needs vitamin B12 to:   Make red blood cells.  Make DNA. This is the genetic material inside all of your cells.  Help your nerves work properly so they can carry messages from your brain to your body. CAUSES  Not eating enough foods that contain vitamin B12.  Not having enough stomach acid and digestive juices. The body needs these to absorb vitamin B12 from the food you eat.  Having certain digestive system diseases that make it hard to absorb vitamin B12. These diseases include Crohn's disease, chronic pancreatitis, and cystic fibrosis.  Having pernicious anemia, which is a condition where the body has too few red blood cells. People with this condition do not make enough of a protein called "intrinsic factor," which is needed to absorb vitamin B12.  Having a surgery in which part of the stomach or small intestine is removed.  Taking certain medicines that make it hard for the body to absorb vitamin B12. These medicines include:  Heartburn medicine (antacids and proton pump inhibitors).  A certain antibiotic medicine called neomycin, which fights  infection.  Some medicines used to treat diabetes, tuberculosis, gout, and high cholesterol. RISK FACTORS Risk factors are things that make you more likely to develop a vitamin B12 deficiency. They include:  Being older than 50.  Being a vegetarian.  Being pregnant and a vegetarian or having a poor diet.  Taking certain drugs.  Being an alcoholic. SYMPTOMS You may have a vitamin B12 deficiency with no symptoms. However, a vitamin B12 deficiency can cause health problems like anemia and nerve damage. These health problems can lead to many possible symptoms, including:  Weakness.  Fatigue.  Loss of appetite.  Weight loss.  Numbness or tingling in your hands and feet.  Redness and burning of the tongue.  Confusion or memory problems.  Depression.  Dizziness.  Sensory problems, such as loss of taste, color blindness, and ringing in the ears.  Diarrhea or constipation.  Trouble walking. DIAGNOSIS Various types of tests can be given to help find the cause of your vitamin B12 deficiency. These tests include:  A complete blood count (CBC). This test gives your caregiver an overall picture of what makes up your blood.  A blood test to measure your B12 level.  A blood test to measure intrinsic factor.  An endoscopy. This procedure uses a thin tube with a camera on the end to look into your stomach or intestines. TREATMENT Treatment for vitamin B12 deficiency depends on what  is causing it. Common options include:  Changing your eating and drinking habits, such as:  Eating more foods that contain vitamin B12.  Not drinking as much alcohol or any alcohol.  Taking vitamin B12 supplements. Your caregiver will tell you what dose is best for you.  Getting vitamin B12 injections. Some people get these a few times a week. Others get them once a month. HOME CARE INSTRUCTIONS  Take all supplements as directed by your caregiver. Follow the directions carefully.  Get any  injections your caregiver prescribes. Do not miss your appointments.  Eat lots of healthy foods that contain vitamin B12. Ask your caregiver if you should work with a nutritionist. Good things to include in your diet are:  Meat.  Poultry.  Fish.  Eggs.  Fortified cereal and dairy products. This means vitamin B12 has been added to the food. Check the label on the package to be sure.  Do not abuse alcohol.  Keep all follow-up appointments. Your caregiver will need to perform blood tests to make sure your vitamin B12 deficiency is going away. SEEK MEDICAL CARE IF:  You have any questions about your treatment.  Your symptoms come back. MAKE SURE YOU:  Understand these instructions.  Will watch your condition.  Will get help right away if you are not doing well or get worse. Document Released: 05/06/2011 Document Reviewed: 05/06/2011 [facility] Patient Information 2013 [place], [place].

## 2012-06-28 NOTE — Progress Notes (Signed)
Subjective:    Patient ID: Kimberly Kent, female    DOB: 02-20-1956, 57 y.o.   MRN: [id number]  HPI Progressive burning pain into both feet and legs espec after on her feet a lot.No weaknes but diffuse fatigue. Has stopped exercising Has LBP more right than left.No anorexia or weight loss. Does have lots of anxiety. Wishes to try cymbalta again. Insomnia and fatigue   Review of Systems D deficiency and thyroid def.    Objective:   Physical Exam  Vitals reviewed. Constitutional: She is oriented to person, place, and time. She appears well-developed and well-nourished. No distress.  HENT:  Mouth/Throat: Oropharynx is clear and moist.  Eyes: EOM are normal. No scleral icterus.  Neck: Normal range of motion. Neck supple.  Cardiovascular: Normal rate, regular rhythm and normal heart sounds.   Pulmonary/Chest: Effort normal and breath sounds normal.  Abdominal: Soft. She exhibits no mass. There is no tenderness.  Musculoskeletal: Normal range of motion. She exhibits tenderness.  Lymphadenopathy:    She has no cervical adenopathy.  Neurological: She is alert and oriented to person, place, and time. She has normal strength. A sensory deficit is present. No cranial nerve deficit. She displays a negative Romberg sign. She displays no Babinski's sign on the right side. She displays no Babinski's sign on the left side.  Reflex Scores:      Patellar reflexes are 2+ on the right side and 2+ on the left side.      Achilles reflexes are 1+ on the right side and 1+ on the left side. Skin: Skin is dry and intact.  Loss of vibratory sesation feet   Results for orders placed in visit on 06/28/12  POCT URINALYSIS DIPSTICK      Result Value Range   Color, UA yellow     Clarity, UA clear     Glucose, UA neg     Bilirubin, UA neg     Ketones, UA neg     Spec Grav, UA <=1.005     Blood, UA moderate     pH, UA 6.0     Protein, UA neg     Urobilinogen, UA 0.2     Nitrite, UA neg     Leukocytes,  UA Negative    POCT UA - MICROSCOPIC ONLY      Result Value Range   WBC, Ur, HPF, POC neg     RBC, urine, microscopic 0-2     Bacteria, U Microscopic neg     Mucus, UA neg     Epithelial cells, urine per micros 0-1     Crystals, Ur, HPF, POC neg     Casts, Ur, LPF, POC neg     Yeast, UA neg    POCT CBC      Result Value Range   WBC 5.6  4.6 - 10.2 K/uL   Lymph, poc 1.9  0.6 - 3.4   POC LYMPH PERCENT 33.5  10 - 50 %L   MID (cbc) 0.4  0 - 0.9   POC MID % 7.9  0 - 12 %M   POC Granulocyte 3.3  2 - 6.9   Granulocyte percent 58.6  37 - 80 %G   RBC 4.15  4.04 - 5.48 M/uL   Hemoglobin 12.2  12.2 - 16.2 g/dL   HCT, POC 78.2  95.6 - 47.9 %   MCV 93.7  80 - 97 fL   MCH, POC 29.4  27 - 31.2 pg   MCHC 31.4 (*)  31.8 - 35.4 g/dL   RDW, POC 16.1     Platelet Count, POC 275  142 - 424 K/uL   MPV 7.9  0 - 99.8 fL  GLUCOSE, POCT (MANUAL RESULT ENTRY)      Result Value Range   POC Glucose 71  70 - 99 mg/dl   [facility] reading (PRIMARY) by  Dr Perrin Maltese bilateral burning paresthesias both legs and feet.       Assessment & Plan:  Schedule MRI of ls spine Start B12 supplement Increase prozac 20mg  qd/Add Alprazolam .25 hs

## 2012-07-01 ENCOUNTER — Encounter: Payer: Self-pay | Admitting: *Deleted

## 2012-07-02 ENCOUNTER — Encounter: Payer: Self-pay | Admitting: Internal Medicine

## 2012-07-02 NOTE — Telephone Encounter (Signed)
Dr. Perrin Maltese, spoke with pt and she is concerned about her elevated ESR. Please advise. Thanks

## 2012-07-02 NOTE — Telephone Encounter (Signed)
Pts labs were normal.

## 2012-07-13 ENCOUNTER — Ambulatory Visit
Admission: RE | Admit: 2012-07-13 | Discharge: 2012-07-13 | Disposition: A | Discharge status: Home / Self Care | Payer: No Typology Code available for payment source | Source: Ambulatory Visit | Attending: Internal Medicine | Admitting: Internal Medicine

## 2012-07-13 DIAGNOSIS — R202 Paresthesia of skin: Secondary | ICD-10-CM

## 2012-12-06 ENCOUNTER — Other Ambulatory Visit: Payer: Self-pay | Admitting: Internal Medicine

## 2012-12-31 ENCOUNTER — Other Ambulatory Visit: Payer: Self-pay

## 2013-01-05 ENCOUNTER — Ambulatory Visit: Payer: PRIVATE HEALTH INSURANCE

## 2013-01-05 ENCOUNTER — Telehealth: Payer: Self-pay

## 2013-01-05 ENCOUNTER — Ambulatory Visit (INDEPENDENT_AMBULATORY_CARE_PROVIDER_SITE_OTHER): Payer: PRIVATE HEALTH INSURANCE | Admitting: Internal Medicine

## 2013-01-05 VITALS — BP 124/68 | HR 73 | Temp 98.3°F | Resp 16 | Ht 66.0 in | Wt 198.6 lb

## 2013-01-05 DIAGNOSIS — M1710 Unilateral primary osteoarthritis, unspecified knee: Secondary | ICD-10-CM

## 2013-01-05 DIAGNOSIS — M25569 Pain in unspecified knee: Secondary | ICD-10-CM

## 2013-01-05 DIAGNOSIS — IMO0001 Reserved for inherently not codable concepts without codable children: Secondary | ICD-10-CM

## 2013-01-05 DIAGNOSIS — E039 Hypothyroidism, unspecified: Secondary | ICD-10-CM

## 2013-01-05 DIAGNOSIS — R269 Unspecified abnormalities of gait and mobility: Secondary | ICD-10-CM

## 2013-01-05 DIAGNOSIS — G8929 Other chronic pain: Secondary | ICD-10-CM

## 2013-01-05 DIAGNOSIS — M79604 Pain in right leg: Secondary | ICD-10-CM

## 2013-01-05 DIAGNOSIS — M79609 Pain in unspecified limb: Secondary | ICD-10-CM

## 2013-01-05 DIAGNOSIS — M791 Myalgia, unspecified site: Secondary | ICD-10-CM

## 2013-01-05 DIAGNOSIS — M4802 Spinal stenosis, cervical region: Secondary | ICD-10-CM

## 2013-01-05 DIAGNOSIS — F39 Unspecified mood [affective] disorder: Secondary | ICD-10-CM

## 2013-01-05 DIAGNOSIS — M1711 Unilateral primary osteoarthritis, right knee: Secondary | ICD-10-CM

## 2013-01-05 LAB — POCT CBC
Granulocyte percent: 61.3 %G (ref 37–80)
Hemoglobin: 12.7 g/dL (ref 12.2–16.2)
MCHC: 31.6 g/dL — AB (ref 31.8–35.4)
MID (cbc): 0.3 (ref 0–0.9)
POC MID %: 6.3 %M (ref 0–12)
RBC: 4.14 M/uL (ref 4.04–5.48)
RDW, POC: 12.3 %
WBC: 4.5 10*3/uL — AB (ref 4.6–10.2)

## 2013-01-05 LAB — COMPREHENSIVE METABOLIC PANEL
BUN: 12 mg/dL (ref 6–23)
CO2: 26 mEq/L (ref 19–32)
Calcium: 9.2 mg/dL (ref 8.4–10.5)
Chloride: 105 mEq/L (ref 96–112)
Creat: 0.67 mg/dL (ref 0.50–1.10)

## 2013-01-05 LAB — CK: Total CK: 65 U/L (ref 7–177)

## 2013-01-05 MED ORDER — SYNTHROID 75 MCG PO TABS: 75.0000 ug | ORAL_TABLET | Freq: Every day | ORAL | Status: DC

## 2013-01-05 MED ORDER — FLUOXETINE HCL 10 MG PO CAPS: 10.0000 mg | ORAL_CAPSULE | Freq: Every day | ORAL | Status: DC

## 2013-01-05 MED ORDER — IBUPROFEN 600 MG PO TABS: 600.0000 mg | ORAL_TABLET | Freq: Three times a day (TID) | ORAL | Status: DC | PRN

## 2013-01-05 MED ORDER — METHYLPREDNISOLONE ACETATE 80 MG/ML IJ SUSP
120.0000 mg | Freq: Once | INTRAMUSCULAR | Status: AC
  Administered 2013-01-05: 120 mg via INTRAMUSCULAR

## 2013-01-05 NOTE — Progress Notes (Signed)
  Subjective:    Patient ID: Kimberly Kent, female    DOB: 1956/01/26, 57 y.o.   MRN: [id number]  HPI Pt complains of right leg pain from hip down to knee. Has been going on for "a while", but it has gotten worse as of late. 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 It has woken her up in the middle of the night. She also notes a cough x 2 years. Never been a smoker. Cough is usually productive. She also needs her meds refilled--Synthroid and Prozac 10.  Her sed rate was 45 last visit, rheumatology w/ups have been negative in the past. Wants to see new rheumatology again   Review of Systems thyroid    Objective:   Physical Exam  Constitutional: She is oriented to person, place, and time. She appears well-developed and well-nourished. No distress.  Eyes: EOM are normal.  Neck: Normal range of motion. Neck supple.  Pulmonary/Chest: Effort normal and breath sounds normal.  Musculoskeletal:       Right hip: She exhibits tenderness. She exhibits normal range of motion, normal strength, no swelling, no crepitus, no deformity and no laceration.       Right knee: She exhibits decreased range of motion, swelling, bony tenderness and abnormal meniscus. She exhibits no effusion, no ecchymosis, no laceration, no erythema, normal alignment and no LCL laxity. Tenderness found.  Neurological: She is alert and oriented to person, place, and time. No cranial nerve deficit. She exhibits normal muscle tone. Coordination abnormal.  Psychiatric: She has a normal mood and affect. Her behavior is normal.    [facility] reading (PRIMARY) by  Dr Perrin Maltese hip normal, knee marked DJD. Results for orders placed in visit on 01/05/13  POCT CBC      Result Value Range   WBC 4.5 (*) 4.6 - 10.2 K/uL   Lymph, poc 1.5  0.6 - 3.4   POC LYMPH PERCENT 32.4  10 - 50 %L   MID (cbc) 0.3  0 - 0.9   POC MID % 6.3  0 - 12 %M   POC Granulocyte 2.8  2 - 6.9   Granulocyte percent 61.3  37 - 80 %G   RBC 4.14  4.04 - 5.48 M/uL   Hemoglobin 12.7  12.2 -  16.2 g/dL   HCT, POC 40.9  81.1 - 47.9 %   MCV 97.0  80 - 97 fL   MCH, POC 30.7  27 - 31.2 pg   MCHC 31.6 (*) 31.8 - 35.4 g/dL   RDW, POC 91.4     Platelet Count, POC 239  142 - 424 K/uL   MPV 7.7  0 - 99.8 fL          Assessment & Plan:  Right back/hip/knee pain Myalgias waist down/possible spinal stenosis Refer to Rheumatology/Later spine doc RF meds

## 2013-01-05 NOTE — Telephone Encounter (Signed)
Patient went to [facility] - they only had the script for FLUoxetine (PROZAC) 10 MG capsule   She and Dr discussed motrin 600 mg  And the SYNTHROID 75 MCG tablet   [phone]

## 2013-01-05 NOTE — Telephone Encounter (Signed)
Called pharm who stated they did get the Rx for Synthroid but was just too soon to fill. They will call pt and let her know this. I do not see any Rx or notes concerning the Motrin 600 mg. Can we Rx this for pt? Notified pt of status of both Rxs.

## 2013-01-05 NOTE — Patient Instructions (Signed)
Spinal Stenosis  Spinal stenosis is an abnormal narrowing of the canals of your spine (vertebrae).  CAUSES   Spinal stenosis is caused by areas of bone pushing into the central canals of your vertebrae. This condition can be present at birth (congenital). It also may be caused by arthritic deterioration of your vertebrae (spinal degeneration).   SYMPTOMS   · Pain that is generally worse with activities, particularly standing and walking.  · Numbness, tingling, hot or cold sensations, weakness, or weariness in your legs.  · Frequent episodes of falling.  · A foot-slapping gait that leads to muscle weakness.  DIAGNOSIS   Spinal stenosis is diagnosed with the use of magnetic resonance imaging (MRI) or computed tomography (CT).  TREATMENT   Initial therapy for spinal stenosis focuses on the management of the pain and other symptoms associated with the condition. These therapies include:  · Practicing postural changes to lessen pressure on your nerves.  · Exercises to strengthen the core of your body.  · Loss of excess body weight.  · The use of nonsteroidal anti-inflammatory medications to reduce swelling and inflammation in your nerves.  When therapies to manage pain are not successful, surgery to treat spinal stenosis may be recommended. This surgery involves removing excess bone, which puts pressure on your nerve roots. During this surgery (laminectomy), the posterior boney arch (lamina) and excess bone around the facet joints are removed.  Document Released: 05/04/2003 Document Revised: 06/08/2012 Document Reviewed: 05/22/2012  [employer]® Patient Information ©2014 [employer].

## 2013-01-05 NOTE — Telephone Encounter (Signed)
Motrin sent

## 2013-01-11 ENCOUNTER — Telehealth: Payer: Self-pay

## 2013-01-11 NOTE — Telephone Encounter (Signed)
PT HAD LAB WORK DONE OVER A WEEK AGO AND STILL HASN'T HEARD FROM ANYONE. PLEASE CALL [phone]

## 2013-01-11 NOTE — Telephone Encounter (Signed)
Letter mailed, her sed rate abnormal. Others normal, she should let rheumatology know about the elevated sed rate. Dr Corliss Skains , she has appt but not until January she will try the motrin and wants to know what else you recommend until then, her low back is painful. Right leg/ foot numb, she is asking what you recommend in regards to this.

## 2013-02-25 HISTORY — PX: UPPER GASTROINTESTINAL ENDOSCOPY: SHX188

## 2013-03-25 ENCOUNTER — Ambulatory Visit (HOSPITAL_COMMUNITY)
Admission: RE | Admit: 2013-03-25 | Discharge: 2013-03-25 | Disposition: A | Discharge status: Home / Self Care | Payer: BC Managed Care – PPO | Source: Ambulatory Visit | Attending: Rheumatology | Admitting: Rheumatology

## 2013-03-25 ENCOUNTER — Other Ambulatory Visit (HOSPITAL_COMMUNITY): Payer: Self-pay | Admitting: Rheumatology

## 2013-03-25 DIAGNOSIS — M898X9 Other specified disorders of bone, unspecified site: Secondary | ICD-10-CM | POA: Insufficient documentation

## 2013-03-25 DIAGNOSIS — M1710 Unilateral primary osteoarthritis, unspecified knee: Secondary | ICD-10-CM | POA: Insufficient documentation

## 2013-03-25 DIAGNOSIS — M719 Bursopathy, unspecified: Secondary | ICD-10-CM | POA: Insufficient documentation

## 2013-03-25 DIAGNOSIS — M25469 Effusion, unspecified knee: Secondary | ICD-10-CM | POA: Insufficient documentation

## 2013-03-25 DIAGNOSIS — M6790 Unspecified disorder of synovium and tendon, unspecified site: Secondary | ICD-10-CM | POA: Insufficient documentation

## 2013-03-25 DIAGNOSIS — M2390 Unspecified internal derangement of unspecified knee: Secondary | ICD-10-CM

## 2013-03-25 DIAGNOSIS — M23302 Other meniscus derangements, unspecified lateral meniscus, unspecified knee: Secondary | ICD-10-CM | POA: Insufficient documentation

## 2013-06-18 ENCOUNTER — Ambulatory Visit (INDEPENDENT_AMBULATORY_CARE_PROVIDER_SITE_OTHER): Payer: BC Managed Care – PPO | Admitting: Internal Medicine

## 2013-06-18 ENCOUNTER — Ambulatory Visit: Payer: BC Managed Care – PPO

## 2013-06-18 VITALS — BP 110/70 | HR 60 | Temp 98.0°F | Resp 16 | Ht 65.5 in | Wt 198.0 lb

## 2013-06-18 DIAGNOSIS — R1013 Epigastric pain: Secondary | ICD-10-CM

## 2013-06-18 DIAGNOSIS — M2550 Pain in unspecified joint: Secondary | ICD-10-CM

## 2013-06-18 DIAGNOSIS — R05 Cough: Secondary | ICD-10-CM

## 2013-06-18 DIAGNOSIS — J9801 Acute bronchospasm: Secondary | ICD-10-CM

## 2013-06-18 DIAGNOSIS — R059 Cough, unspecified: Secondary | ICD-10-CM

## 2013-06-18 DIAGNOSIS — Z0000 Encounter for general adult medical examination without abnormal findings: Secondary | ICD-10-CM

## 2013-06-18 LAB — POCT UA - MICROSCOPIC ONLY
Bacteria, U Microscopic: NEGATIVE
CASTS, UR, LPF, POC: NEGATIVE
Crystals, Ur, HPF, POC: NEGATIVE
EPITHELIAL CELLS, URINE PER MICROSCOPY: NEGATIVE
Mucus, UA: NEGATIVE
WBC, Ur, HPF, POC: NEGATIVE
YEAST UA: NEGATIVE

## 2013-06-18 LAB — BASIC METABOLIC PANEL
BUN: 11 mg/dL (ref 6–23)
CALCIUM: 9.3 mg/dL (ref 8.4–10.5)
CHLORIDE: 104 meq/L (ref 96–112)
CO2: 25 mEq/L (ref 19–32)
CREATININE: 0.66 mg/dL (ref 0.50–1.10)
Glucose, Bld: 80 mg/dL (ref 70–99)
Potassium: 4 mEq/L (ref 3.5–5.3)
Sodium: 138 mEq/L (ref 135–145)

## 2013-06-18 LAB — LIPID PANEL
CHOLESTEROL: 216 mg/dL — AB (ref 0–200)
HDL: 59 mg/dL (ref >39)
LDL Cholesterol: 147 mg/dL — ABNORMAL HIGH (ref 0–99)
Total CHOL/HDL Ratio: 3.7 Ratio
Triglycerides: 50 mg/dL (ref <150)
VLDL: 10 mg/dL (ref 0–40)

## 2013-06-18 LAB — POCT URINALYSIS DIPSTICK
BILIRUBIN UA: NEGATIVE
Glucose, UA: NEGATIVE
Ketones, UA: NEGATIVE
Leukocytes, UA: NEGATIVE
NITRITE UA: NEGATIVE
PH UA: 7.5
PROTEIN UA: NEGATIVE
Spec Grav, UA: 1.01
UROBILINOGEN UA: 0.2

## 2013-06-18 LAB — POCT GLYCOSYLATED HEMOGLOBIN (HGB A1C): Hemoglobin A1C: 5

## 2013-06-18 LAB — TSH: TSH: 1.117 u[IU]/mL (ref 0.350–4.500)

## 2013-06-18 LAB — IRON AND TIBC
%SAT: 21 % (ref 20–55)
Iron: 76 ug/dL (ref 42–145)
TIBC: 368 ug/dL (ref 250–470)
UIBC: 292 ug/dL (ref 125–400)

## 2013-06-18 LAB — GLUCOSE, POCT (MANUAL RESULT ENTRY): POC Glucose: 78 mg/dl (ref 70–99)

## 2013-06-18 MED ORDER — FLUTICASONE PROPIONATE 50 MCG/ACT NA SUSP: 2.0000 | Freq: Every day | NASAL | Status: DC

## 2013-06-18 MED ORDER — FLUTICASONE-SALMETEROL 250-50 MCG/DOSE IN AEPB: 1.0000 | INHALATION_SPRAY | Freq: Two times a day (BID) | RESPIRATORY_TRACT | Status: DC

## 2013-06-18 NOTE — Progress Notes (Signed)
   Subjective:    Patient ID: Kimberly Kent, female    DOB: 19-May-1955, 58 y.o.   MRN: [id number]  HPI    Review of Systems     Objective:   Physical Exam        Assessment & Plan:

## 2013-06-18 NOTE — Progress Notes (Signed)
Subjective:    Patient ID: Kimberly Kent, female    DOB: 04-Feb-1956, 58 y.o.   MRN: [id number]  HPI Kimberly Kent present to the clinic today for her physical exam. She was last here for her exam January 2014.  She c/o abdominal pain and cough. Dr. Marjory Lies suggested she may have fibromyalgia. Today she is coughing, has been coughing for 3 years, getting worse now. Had elevated lipids 18 months ago and needs repeat. Had 2 steroid injections and has been exercising more.  Review of Systems  Constitutional: Positive for fatigue.  Eyes: Negative.   Respiratory: Positive for cough and shortness of breath.   Cardiovascular: Negative.   Gastrointestinal: Positive for abdominal pain.  Endocrine: Negative.   Genitourinary: Negative.   Musculoskeletal: Positive for arthralgias, back pain, joint swelling, myalgias, neck pain and neck stiffness.  Allergic/Immunologic: Negative.   Neurological: Negative.   Hematological: Negative.   Psychiatric/Behavioral: Negative.        Objective:   Physical Exam  Vitals reviewed. Constitutional: She is oriented to person, place, and time. She appears well-developed and well-nourished. No distress.  HENT:  Head: Normocephalic.  Right Ear: External ear normal.  Left Ear: External ear normal.  Mouth/Throat: Oropharynx is clear and moist.  Eyes: Conjunctivae and EOM are normal. Pupils are equal, round, and reactive to light. Right eye exhibits no discharge. No scleral icterus.  Neck: Normal range of motion. Neck supple. No thyromegaly present.  Cardiovascular: Normal rate, regular rhythm, normal heart sounds and intact distal pulses.   Pulmonary/Chest: Effort normal and breath sounds normal. No respiratory distress. She has no wheezes. She has no rales. She exhibits no tenderness.  Abdominal: Soft. Bowel sounds are normal. She exhibits no mass. There is tenderness.  Musculoskeletal: She exhibits tenderness.  Lymphadenopathy:    She has no cervical  adenopathy.  Neurological: She is alert and oriented to person, place, and time. She has normal reflexes. No cranial nerve deficit. She exhibits normal muscle tone. Coordination normal.  Skin: No rash noted.  Psychiatric: She has a normal mood and affect. Her behavior is normal. Judgment and thought content normal.   [facility] reading (PRIMARY) by  Dr.Guest normal cxr Results for orders placed in visit on 06/18/13  GLUCOSE, POCT (MANUAL RESULT ENTRY)      Result Value Ref Range   POC Glucose 78  70 - 99 mg/dl  POCT GLYCOSYLATED HEMOGLOBIN (HGB A1C)      Result Value Ref Range   Hemoglobin A1C 5.0    POCT UA - MICROSCOPIC ONLY      Result Value Ref Range   WBC, Ur, HPF, POC neg     RBC, urine, microscopic 0-1     Bacteria, U Microscopic neg     Mucus, UA neg     Epithelial cells, urine per micros neg     Crystals, Ur, HPF, POC neg     Casts, Ur, LPF, POC neg     Yeast, UA neg    POCT URINALYSIS DIPSTICK      Result Value Ref Range   Color, UA yellow     Clarity, UA clear     Glucose, UA neg     Bilirubin, UA neg     Ketones, UA neg     Spec Grav, UA 1.010     Blood, UA trace-intact     pH, UA 7.5     Protein, UA neg     Urobilinogen, UA 0.2     Nitrite, UA  neg     Leukocytes, UA Negative      PFR 450 l/min above normal with wheezes heard .         Assessment & Plan:  Bronchospastic/allergic cough Start Fluticasone nasal/Advair 250/50 disc

## 2013-06-18 NOTE — Patient Instructions (Addendum)
Cough, Adult  A cough is a reflex that helps clear your throat and airways. It can help heal the body or may be a reaction to an irritated airway. A cough may only last 2 or 3 weeks (acute) or may last more than 8 weeks (chronic).  CAUSES Acute cough:  Viral or bacterial infections. Chronic cough:  Infections.  Allergies.  Asthma.  Post-nasal drip.  Smoking.  Heartburn or acid reflux.  Some medicines.  Chronic lung problems (COPD).  Cancer. SYMPTOMS   Cough.  Fever.  Chest pain.  Increased breathing rate.  High-pitched whistling sound when breathing (wheezing).  Colored mucus that you cough up (sputum). TREATMENT   A bacterial cough may be treated with antibiotic medicine.  A viral cough must run its course and will not respond to antibiotics.  Your caregiver may recommend other treatments if you have a chronic cough. HOME CARE INSTRUCTIONS   Only take over-the-counter or prescription medicines for pain, discomfort, or fever as directed by your caregiver. Use cough suppressants only as directed by your caregiver.  Use a cold steam vaporizer or humidifier in your bedroom or home to help loosen secretions.  Sleep in a semi-upright position if your cough is worse at night.  Rest as needed.  Stop smoking if you smoke. SEEK IMMEDIATE MEDICAL CARE IF:   You have pus in your sputum.  Your cough starts to worsen.  You cannot control your cough with suppressants and are losing sleep.  You begin coughing up blood.  You have difficulty breathing.  You develop pain which is getting worse or is uncontrolled with medicine.  You have a fever. MAKE SURE YOU:   Understand these instructions.  Will watch your condition.  Will get help right away if you are not doing well or get worse. Document Released: 08/10/2010 Document Revised: 05/06/2011 Document Reviewed: 08/10/2010 [facility] Patient Information 2014 [place]. Abdominal Pain, Adult Many  things can cause abdominal pain. Usually, abdominal pain is not caused by a disease and will improve without treatment. It can often be observed and treated at home. Your health care provider will do a physical exam and possibly order blood tests and X-rays to help determine the seriousness of your pain. However, in many cases, more time must pass before a clear cause of the pain can be found. Before that point, your health care provider may not know if you need more testing or further treatment. HOME CARE INSTRUCTIONS  Monitor your abdominal pain for any changes. The following actions may help to alleviate any discomfort you are experiencing:  Only take over-the-counter or prescription medicines as directed by your health care provider.  Do not take laxatives unless directed to do so by your health care provider.  Try a clear liquid diet (broth, tea, or water) as directed by your health care provider. Slowly move to a bland diet as tolerated. SEEK MEDICAL CARE IF:  You have unexplained abdominal pain.  You have abdominal pain associated with nausea or diarrhea.  You have pain when you urinate or have a bowel movement.  You experience abdominal pain that wakes you in the night.  You have abdominal pain that is worsened or improved by eating food.  You have abdominal pain that is worsened with eating fatty foods. SEEK IMMEDIATE MEDICAL CARE IF:   Your pain does not go away within 2 hours.  You have a fever.  You keep throwing up (vomiting).  Your pain is felt only in portions of the  abdomen, such as the right side or the left lower portion of the abdomen.  You pass bloody or black tarry stools. MAKE SURE YOU:  Understand these instructions.   Will watch your condition.   Will get help right away if you are not doing well or get worse.  Document Released: 11/21/2004 Document Revised: 12/02/2012 Document Reviewed: 10/21/2012 [facility] Patient Information 2014 [place]. Bronchospasm, Adult A bronchospasm is a spasm or tightening of the airways going into the lungs. During a bronchospasm breathing becomes more difficult because the airways get smaller. When this happens there can be coughing, a whistling sound when breathing (wheezing), and difficulty breathing. Bronchospasm is often associated with asthma, but not all patients who experience a bronchospasm have asthma. CAUSES  A bronchospasm is caused by inflammation or irritation of the airways. The inflammation or irritation may be triggered by:   Allergies (such as to animals, pollen, food, or mold). Allergens that cause bronchospasm may cause wheezing immediately after exposure or many hours later.   Infection. Viral infections are believed to be the most common cause of bronchospasm.   Exercise.   Irritants (such as pollution, cigarette smoke, strong odors, aerosol sprays, and paint fumes).   Weather changes. Winds increase molds and pollens in the air. Rain refreshes the air by washing irritants out. Cold air may cause inflammation.   Stress and emotional upset.  SIGNS AND SYMPTOMS   Wheezing.   Excessive nighttime coughing.   Frequent or severe coughing with a simple cold.   Chest tightness.   Shortness of breath.  DIAGNOSIS  Bronchospasm is usually diagnosed through a history and physical exam. Tests, such as chest X-rays, are sometimes done to look for other conditions. TREATMENT   Inhaled medicines can be given to open up your airways and help you breathe. The medicines can be given using either an inhaler or a nebulizer machine.  Corticosteroid medicines may be given for severe bronchospasm, usually when it is associated with asthma. HOME CARE INSTRUCTIONS   Always have a plan prepared for seeking medical care. Know when to call your health care provider and local emergency services (911 in the [place] Know where you can access local emergency care.  Only take  medicines as directed by your health care provider.  If you were prescribed an inhaler or nebulizer machine, ask your health care provider to explain how to use it correctly. Always use a spacer with your inhaler if you were given one.  It is necessary to remain calm during an attack. Try to relax and breathe more slowly.  Control your home environment in the following ways:   Change your heating and air conditioning filter at least once a month.   Limit your use of fireplaces and wood stoves.  Do not smoke and do not allow smoking in your home.   Avoid exposure to perfumes and fragrances.   Get rid of pests (such as roaches and mice) and their droppings.   Throw away plants if you see mold on them.   Keep your house clean and dust free.   Replace carpet with wood, tile, or vinyl flooring. Carpet can trap dander and dust.   Use allergy-proof pillows, mattress covers, and box spring covers.   Wash bed sheets and blankets every week in hot water and dry them in a dryer.   Use blankets that are made of polyester or cotton.   Wash hands frequently. SEEK MEDICAL CARE IF:   You have muscle aches.  You have chest pain.   The sputum changes from clear or white to yellow, green, gray, or bloody.   The sputum you cough up gets thicker.   There are problems that may be related to the medicine you are given, such as a rash, itching, swelling, or trouble breathing.  SEEK IMMEDIATE MEDICAL CARE IF:   You have worsening wheezing and coughing even after taking your prescribed medicines.   You have increased difficulty breathing.   You develop severe chest pain. MAKE SURE YOU:   Understand these instructions.  Will watch your condition.  Will get help right away if you are not doing well or get worse. Document Released: 02/14/2003 Document Revised: 10/14/2012 Document Reviewed: 08/03/2012 [facility] Patient Information 2014 [place].

## 2013-06-24 ENCOUNTER — Encounter: Payer: Self-pay | Admitting: *Deleted

## 2013-07-22 ENCOUNTER — Telehealth: Payer: Self-pay | Admitting: Internal Medicine

## 2013-07-22 NOTE — Telephone Encounter (Signed)
Patient states she has had abdominal bloating and pain off and on for 2-3 years. Seems to be getting worse. Her PCP told her to schedule with Dr. Olevia Perches. Scheduled patient on 08/03/13 at 2:30 PM.

## 2013-07-22 NOTE — Telephone Encounter (Signed)
Left a message for patient to call back. 

## 2013-07-30 ENCOUNTER — Encounter: Payer: Self-pay | Admitting: *Deleted

## 2013-08-03 ENCOUNTER — Encounter: Payer: Self-pay | Admitting: Internal Medicine

## 2013-08-03 ENCOUNTER — Ambulatory Visit (INDEPENDENT_AMBULATORY_CARE_PROVIDER_SITE_OTHER): Payer: BC Managed Care – PPO | Admitting: Internal Medicine

## 2013-08-03 VITALS — BP 120/78 | HR 78 | Ht 65.0 in | Wt 197.0 lb

## 2013-08-03 DIAGNOSIS — R1013 Epigastric pain: Secondary | ICD-10-CM

## 2013-08-03 MED ORDER — OMEPRAZOLE 20 MG PO CPDR: 20.0000 mg | DELAYED_RELEASE_CAPSULE | Freq: Every day | ORAL | Status: DC

## 2013-08-03 NOTE — Patient Instructions (Addendum)
You have been scheduled for an abdominal ultrasound at [facility] (1st floor of hospital) on 08/09/2013  at  8:30am. Please arrive 15 minutes prior to your appointment for registration. Make certain not to have anything to eat or drink 6 hours prior to your appointment. Should you need to reschedule your appointment, please contact radiology at [phone]. This test typically takes about 30 minutes to perform.  You have been scheduled for an endoscopy with propofol. Please follow written instructions given to you at your visit today. If you use inhalers (even only as needed), please bring them with you on the day of your procedure. Your physician has requested that you go to www.startemmi.com and enter the access code given to you at your visit today. This web site gives a general overview about your procedure. However, you should still follow specific instructions given to you by our office regarding your preparation for the procedure.  You will need to discontinue Ibuprofen   Start Prilosec 20mg  once daily   CC:Dr Lou Miner

## 2013-08-03 NOTE — Progress Notes (Signed)
Kimberly Kent 02-Jun-1955 [id number]  Note: This dictation was prepared with Dragon digital system. Any transcriptional errors that result from this procedure are unintentional.   History of Present Illness:  This is a 58 year old white female who has had mid abdominal and epigastric discomfort which she describes as bloating and swelling occurring postprandially as well as waking her at night. It sometimes radiates to her back. She denies vomiting but has occasional nausea. There is no family history of gallbladder disease. She takes ibuprofen 600 mg several times a day. Her weight has increased by 20 pounds in the last 2 years. We saw her in February 2011 for a screening colonoscopy which showed a hyperplastic polyp. A CT scan of the abdomen in August 2012 was normal.    Past Medical History  Diagnosis Date  . Thyroid disease   . Arthritis   . Neuromuscular disorder   . Anemia   . Anxiety   . Blood transfusion without reported diagnosis   . Hyperplastic colon polyp   . Migraines   . Depression   . Vitamin D deficiency   . Patent foramen ovale     Past Surgical History  Procedure Laterality Date  . Abdominal hysterectomy    . Knee arthroscopy      Allergies  Allergen Reactions  . Bupropion Hcl     REACTION: hives  . Codeine     REACTION: nausea and vomiting    Family history and social history have been reviewed.  Review of Systems: Negative for dysphagia. Negative for heartburn  The remainder of the 10 point ROS is negative except as outlined in the H&P  Physical Exam: General Appearance Well developed, in no distress Eyes  Non icteric  HEENT  Non traumatic, normocephalic  Mouth No lesion, tongue papillated, no cheilosis Neck Supple without adenopathy, thyroid not enlarged, no carotid bruits, no JVD Lungs Clear to auscultation bilaterally COR Normal S1, normal S2, regular rhythm, no murmur, quiet precordium Abdomen very tender at about the umbilical is in  midline. No ventral hernia. Normoactive bowel sounds. Liver edge at costal margin. No pain in right upper quadrant area; lower abdomen unremarkable Rectal not done Extremities  No pedal edema Skin No lesions Neurological Alert and oriented x 3 Psychological Normal mood and affect  Assessment and Plan:   Problem #1 Epigastric and supraumbilical pain and discomfort as well as bloating and dyspepsia in a 58 year old white female who takes excess ibuprofen. Gastritis or an ulcer is the most likely scenario. Biliary dysfunction or functional dyspepsia would be another possibility. I have asked her to stop taking ibuprofen immediately and have prescribed her Prilosec 20 mg daily. We will schedule her for an upper endoscopy. We will also proceed with an upper abdominal ultrasound to rule out cholelithiasis.    Lafayette Dragon 08/03/2013

## 2013-08-05 ENCOUNTER — Encounter: Payer: Self-pay | Admitting: Internal Medicine

## 2013-08-06 ENCOUNTER — Ambulatory Visit (HOSPITAL_COMMUNITY): Payer: BC Managed Care – PPO

## 2013-08-09 ENCOUNTER — Ambulatory Visit (HOSPITAL_COMMUNITY)
Admission: RE | Admit: 2013-08-09 | Discharge: 2013-08-09 | Disposition: A | Discharge status: Home / Self Care | Payer: BC Managed Care – PPO | Source: Ambulatory Visit | Attending: Internal Medicine | Admitting: Internal Medicine

## 2013-08-09 DIAGNOSIS — R109 Unspecified abdominal pain: Secondary | ICD-10-CM | POA: Insufficient documentation

## 2013-08-09 DIAGNOSIS — R1013 Epigastric pain: Secondary | ICD-10-CM

## 2013-09-01 ENCOUNTER — Ambulatory Visit (AMBULATORY_SURGERY_CENTER): Payer: BC Managed Care – PPO | Admitting: Internal Medicine

## 2013-09-01 ENCOUNTER — Encounter: Payer: Self-pay | Admitting: Internal Medicine

## 2013-09-01 VITALS — BP 140/86 | HR 61 | Temp 97.0°F | Resp 15 | Ht 65.0 in | Wt 197.0 lb

## 2013-09-01 DIAGNOSIS — R1013 Epigastric pain: Secondary | ICD-10-CM

## 2013-09-01 DIAGNOSIS — K2100 Gastro-esophageal reflux disease with esophagitis, without bleeding: Secondary | ICD-10-CM

## 2013-09-01 DIAGNOSIS — K219 Gastro-esophageal reflux disease without esophagitis: Secondary | ICD-10-CM

## 2013-09-01 MED ORDER — SODIUM CHLORIDE 0.9 % IV SOLN: 500.0000 mL | INTRAVENOUS | Status: DC

## 2013-09-01 NOTE — Progress Notes (Signed)
Called to room to assist during endoscopic procedure.  Patient ID and intended procedure confirmed with present staff. Received instructions for my participation in the procedure from the performing physician.  

## 2013-09-01 NOTE — Patient Instructions (Signed)
Discharge instructions given with verbal understanding. Biopsies taken. Resume previous medications. YOU HAD AN ENDOSCOPIC PROCEDURE TODAY AT THE  ENDOSCOPY CENTER: Refer to the procedure report that was given to you for any specific questions about what was found during the examination.  If the procedure report does not answer your questions, please call your gastroenterologist to clarify.  If you requested that your care partner not be given the details of your procedure findings, then the procedure report has been included in a sealed envelope for you to review at your convenience later.  YOU SHOULD EXPECT: Some feelings of bloating in the abdomen. Passage of more gas than usual.  Walking can help get rid of the air that was put into your GI tract during the procedure and reduce the bloating. If you had a lower endoscopy (such as a colonoscopy or flexible sigmoidoscopy) you may notice spotting of blood in your stool or on the toilet paper. If you underwent a bowel prep for your procedure, then you may not have a normal bowel movement for a few days.  DIET: Your first meal following the procedure should be a light meal and then it is ok to progress to your normal diet.  A half-sandwich or bowl of soup is an example of a good first meal.  Heavy or fried foods are harder to digest and may make you feel nauseous or bloated.  Likewise meals heavy in dairy and vegetables can cause extra gas to form and this can also increase the bloating.  Drink plenty of fluids but you should avoid alcoholic beverages for 24 hours.  ACTIVITY: Your care partner should take you home directly after the procedure.  You should plan to take it easy, moving slowly for the rest of the day.  You can resume normal activity the day after the procedure however you should NOT DRIVE or use heavy machinery for 24 hours (because of the sedation medicines used during the test).    SYMPTOMS TO REPORT IMMEDIATELY: A gastroenterologist  can be reached at any hour.  During normal business hours, 8:30 AM to 5:00 PM Monday through Friday, call [phone].  After hours and on weekends, please call the GI answering service at [phone] who will take a message and have the physician on call contact you.   Following upper endoscopy (EGD)  Vomiting of blood or coffee ground material  New chest pain or pain under the shoulder blades  Painful or persistently difficult swallowing  New shortness of breath  Fever of 100F or higher  Black, tarry-looking stools  FOLLOW UP: If any biopsies were taken you will be contacted by phone or by letter within the next 1-3 weeks.  Call your gastroenterologist if you have not heard about the biopsies in 3 weeks.  Our staff will call the home number listed on your records the next business day following your procedure to check on you and address any questions or concerns that you may have at that time regarding the information given to you following your procedure. This is a courtesy call and so if there is no answer at the home number and we have not heard from you through the emergency physician on call, we will assume that you have returned to your regular daily activities without incident.  SIGNATURES/CONFIDENTIALITY: You and/or your care partner have signed paperwork which will be entered into your electronic medical record.  These signatures attest to the fact that that the information above on your After   Visit Summary has been reviewed and is understood.  Full responsibility of the confidentiality of this discharge information lies with you and/or your care-partner. 

## 2013-09-01 NOTE — Op Note (Signed)
[place]  [address] [place], [address]   ENDOSCOPY PROCEDURE REPORT  PATIENT: Kimberly Kent, Kimberly Kent.  MR#: [id number] BIRTHDATE: [birth year] , 57  yrs. old GENDER: Female ENDOSCOPIST: Lafayette Dragon, MD REFERRED BY:  Lou Miner, M.D. PROCEDURE DATE:  09/01/2013 PROCEDURE:  EGD w/ biopsy ASA CLASS:     Class I INDICATIONS:  Epigastric pain.   ppatient has been taking excess Motrin. MEDICATIONS: MAC sedation, administered by CRNA and Propofol (Diprivan) 110 mg IV TOPICAL ANESTHETIC: none  DESCRIPTION OF PROCEDURE: After the risks benefits and alternatives of the procedure were thoroughly explained, informed consent was obtained.  The LB [id number] endoscope was introduced through the mouth and advanced to the second portion of the duodenum. Without limitations.  The instrument was slowly withdrawn as the mucosa was fully examined.      Esophagus: proximal and mid-esophageal mucosa was normal in the distal esophagus there was 2 cm long linear erosion from the squamocolumnar junction proximally. Z line was otherwise normal multiple biopsies were obtained from the erosions to rule out Barrett's esophagus Stomach: the gastric folds were normal gastric antrum and pyloric outlet was normal retroflexion of the endoscope revealed normal fundus and cardia Duodenum: duodenal bulb and descending duodenum was normal[ The scope was then withdrawn from the patient and the procedure completed.  COMPLICATIONS: There were no complications. ENDOSCOPIC IMPRESSION:  Grade 2 esophagitis , likely secondary to NSAIDs or reflux, rule out Barrett's esophagus. Status post biopsies  RECOMMENDATIONS: 1.  Await biopsy results 2.  avoid Motrin for now Continue PPI  REPEAT EXAM: for EGD pending biopsy results.  eSigned:  Lafayette Dragon, MD 09/01/2013 4:51 PM   CC:  PATIENT NAME:  Kimberly Kent, Kimberly Kent. MR#: [id number]

## 2013-09-01 NOTE — Progress Notes (Signed)
Report to PACU, RN, vss, BBS= Clear.  

## 2013-09-02 ENCOUNTER — Telehealth: Payer: Self-pay | Admitting: *Deleted

## 2013-09-02 NOTE — Telephone Encounter (Signed)
  Follow up Call-  Call back number 09/01/2013  Post procedure Call Back phone  # [phone]  Permission to leave phone message Yes     Patient questions:  Do you have a fever, pain , or abdominal swelling? No. Pain Score  0 *  Have you tolerated food without any problems? Yes.    Have you been able to return to your normal activities? Yes.    Do you have any questions about your discharge instructions: Diet   No. Medications  No. Follow up visit  No.  Do you have questions or concerns about your Care? No.  Actions: * If pain score is 4 or above: No action needed, pain <4.

## 2013-09-07 ENCOUNTER — Encounter: Payer: Self-pay | Admitting: Internal Medicine

## 2013-09-09 ENCOUNTER — Encounter: Payer: Self-pay | Admitting: *Deleted

## 2013-11-30 ENCOUNTER — Encounter: Payer: Self-pay | Admitting: Internal Medicine

## 2013-12-10 ENCOUNTER — Other Ambulatory Visit: Payer: Self-pay

## 2013-12-28 ENCOUNTER — Institutional Professional Consult (permissible substitution): Payer: BC Managed Care – PPO | Admitting: Neurology

## 2013-12-29 ENCOUNTER — Ambulatory Visit (INDEPENDENT_AMBULATORY_CARE_PROVIDER_SITE_OTHER): Payer: BC Managed Care – PPO | Admitting: Neurology

## 2013-12-29 ENCOUNTER — Encounter: Payer: Self-pay | Admitting: Neurology

## 2013-12-29 VITALS — BP 132/76 | HR 64 | Ht 66.0 in | Wt 197.4 lb

## 2013-12-29 DIAGNOSIS — R413 Other amnesia: Secondary | ICD-10-CM

## 2013-12-29 DIAGNOSIS — M797 Fibromyalgia: Secondary | ICD-10-CM

## 2013-12-29 DIAGNOSIS — G43109 Migraine with aura, not intractable, without status migrainosus: Secondary | ICD-10-CM

## 2013-12-29 DIAGNOSIS — M5431 Sciatica, right side: Secondary | ICD-10-CM

## 2013-12-29 HISTORY — DX: Fibromyalgia: M79.7

## 2013-12-29 NOTE — Progress Notes (Signed)
Reason for visit: muscle cramps  Kimberly Kent is a 58 y.o. female  History of present illness:  Kimberly Kent is a 58 year old right-handed white female who was seen previously to this office in 2013 with migraine headaches. The patient indicates that her headaches are relatively infrequent, occurring once or twice a year. The patient however, has had a 2 or 3 year history of neuromuscular discomfort. The patient has chronic right upper cervical discomfort, and she has low back pain and sciatica type discomfort down the right leg on occasion. The patient may have tingling sensations down the right leg. The patient has muscle cramps and achiness of the muscles that occurs below the knees bilaterally, including the feet. The patient may have 3 or 4 days a week where she has significant cramping of the legs. The patient has a sensation of weakness in the legs, and she does have some minimal balance issues during her migraines, otherwise she does not have any significant balance problems. She denies any difficulty controlling the bowels or the bladder. She believes that there has been a change in memory over the last 3 or 4 months, and she is having increasing problems with concentration. The patient has been seen by Dr. Estanislado Pandy, and she has had extensive blood work testing. The sedimentation rates in the past have commonly been elevated. The last sedimentation rate in March 2015 was normal. The patient is sent to this office for further evaluation of the neuromuscular complaints and the memory problems.  Past Medical History  Diagnosis Date  . Thyroid disease   . Arthritis   . Neuromuscular disorder   . Anemia   . Anxiety   . Blood transfusion without reported diagnosis   . Hyperplastic colon polyp   . Migraines   . Depression   . Vitamin D deficiency   . Patent foramen ovale   . Fibromyalgia 12/29/2013    Past Surgical History  Procedure Laterality Date  . Abdominal hysterectomy    . Knee  arthroscopy      right knee/arthroscopic  . Tonsillectomy      Family History  Problem Relation Age of Onset  . Cancer Mother   . Cancer Father   . Diabetes Father   . Parkinson's disease Maternal Grandmother   . Cancer Maternal Grandfather     Social history:  reports that she has never smoked. She has never used smokeless tobacco. She reports that she does not drink alcohol or use illicit drugs.  Medications:  Current Outpatient Prescriptions on File Prior to Visit  Medication Sig Dispense Refill  . cholecalciferol (VITAMIN D) 1000 UNITS tablet Take 1,000 Units by mouth daily.    Marland Kitchen FLUoxetine (PROZAC) 10 MG capsule Take 1 capsule (10 mg total) by mouth daily. Needs office visit (final notice) 90 capsule 3  . SYNTHROID 75 MCG tablet Take 1 tablet (75 mcg total) by mouth daily. 90 tablet 3  . fluticasone (FLONASE) 50 MCG/ACT nasal spray Place 2 sprays into both nostrils daily. 16 g 6   No current facility-administered medications on file prior to visit.      Allergies  Allergen Reactions  . Bupropion Hcl     REACTION: hives  . Codeine     REACTION: nausea and vomiting    ROS:  Out of a complete 14 system review of symptoms, the patient complains only of the following symptoms, and all other reviewed systems are negative.  Fatigue Easy bruising Joint pain, joint swelling,  achy muscles Memory loss, weakness Decreased energy Sleepiness  Blood pressure 132/76, pulse 64, height 5\' 6"  (1.676 m), weight 197 lb 6.4 oz (89.54 kg).  Physical Exam  General: The patient is alert and cooperative at the time of the examination.  Eyes: Pupils are equal, round, and reactive to light. Discs are flat bilaterally.  Neck: The neck is supple, no carotid bruits are noted.  Respiratory: The respiratory examination is clear.  Cardiovascular: The cardiovascular examination reveals a regular rate and rhythm, no obvious murmurs or rubs are noted.  Neuromuscular: The patient has  excellent range movement the low back and cervical spine.  Skin: Extremities are without significant edema.  Neurologic Exam  Mental status: The patient is alert and oriented x 3 at the time of the examination. The patient has apparent normal recent and remote memory, with an apparently normal attention span and concentration ability.  Cranial nerves: Facial symmetry is present. There is good sensation of the face to pinprick and soft touch bilaterally. The strength of the facial muscles and the muscles to head turning and shoulder shrug are normal bilaterally. Speech is well enunciated, no aphasia or dysarthria is noted. Extraocular movements are full. Visual fields are full. The tongue is midline, and the patient has symmetric elevation of the soft palate. No obvious hearing deficits are noted. The patient has a mild side-to-side head tremor.  Motor: The motor testing reveals 5 over 5 strength of all 4 extremities. Good symmetric motor tone is noted throughout.  Sensory: Sensory testing is intact to pinprick, soft touch, vibration sensation, and position sense on all 4 extremities. No evidence of extinction is noted.  Coordination: Cerebellar testing reveals good finger-nose-finger and heel-to-shin bilaterally.  Gait and station: Gait is normal. Tandem gait is normal. Romberg is negative. No drift is seen.  Reflexes: Deep tendon reflexes are symmetric and normal bilaterally. Toes are downgoing bilaterally.   MRI brain 08/09/09:  IMPRESSION: 1. No evidence of acute ischemia. 2. Brain signal within normal limits. 3. Mild inflammatory mucosal thickening of the ethmoids and the maxillary sinuses.   MRI lumbar 07/13/12:  IMPRESSION: Mild lumbar degenerative changes, most prominent in the facet joints at L4-5. This causes mild spinal stenosis. No disc protrusion or neural impingement.     Assessment/Plan:  1. Reported memory disturbance  2. Possible fibromyalgia  3. Leg  cramps  4. Right sided sciatica  The patient has undergone MRI of the lumbosacral spine in the recent past, not showing any source for her sciatica discomfort. The patient may have fibromyalgia, and this may be associated with cognitive dysfunction. The patient will undergo MRI evaluation of the brain. A previous study done in 2011 for right-sided paresthesias was unremarkable. The patient will return for nerve conduction studies of both legs and one arm, EMG of the right leg. The patient may try magnesium supplementation for the leg cramps.  Jill Alexanders MD 12/29/2013 7:27 PM  [facility] [address] [place], [place] [address]  Phone [phone] Fax [phone]

## 2013-12-29 NOTE — Patient Instructions (Signed)
Fibromyalgia Fibromyalgia is a disorder that is often misunderstood. It is associated with muscular pains and tenderness that comes and goes. It is often associated with fatigue and sleep disturbances. Though it tends to be long-lasting, fibromyalgia is not life-threatening. CAUSES  The exact cause of fibromyalgia is unknown. People with certain gene types are predisposed to developing fibromyalgia and other conditions. Certain factors can play a role as triggers, such as:  Spine disorders.  Arthritis.  Severe injury (trauma) and other physical stressors.  Emotional stressors. SYMPTOMS   The main symptom is pain and stiffness in the muscles and joints, which can vary over time.  Sleep and fatigue problems. Other related symptoms may include:  Bowel and bladder problems.  Headaches.  Visual problems.  Problems with odors and noises.  Depression or mood changes.  Painful periods (dysmenorrhea).  Dryness of the skin or eyes. DIAGNOSIS  There are no specific tests for diagnosing fibromyalgia. Patients can be diagnosed accurately from the specific symptoms they have. The diagnosis is made by determining that nothing else is causing the problems. TREATMENT  There is no cure. Management includes medicines and an active, healthy lifestyle. The goal is to enhance physical fitness, decrease pain, and improve sleep. HOME CARE INSTRUCTIONS   Only take over-the-counter or prescription medicines as directed by your caregiver. Sleeping pills, tranquilizers, and pain medicines may make your problems worse.  Low-impact aerobic exercise is very important and advised for treatment. At first, it may seem to make pain worse. Gradually increasing your tolerance will overcome this feeling.  Learning relaxation techniques and how to control stress will help you. Biofeedback, visual imagery, hypnosis, muscle relaxation, yoga, and meditation are all options.  Anti-inflammatory medicines and  physical therapy may provide short-term help.  Acupuncture or massage treatments may help.  Take muscle relaxant medicines as suggested by your caregiver.  Avoid stressful situations.  Plan a healthy lifestyle. This includes your diet, sleep, rest, exercise, and friends.  Find and practice a hobby you enjoy.  Join a fibromyalgia support group for interaction, ideas, and sharing advice. This may be helpful. SEEK MEDICAL CARE IF:  You are not having good results or improvement from your treatment. FOR MORE INFORMATION  National Fibromyalgia Association: www.fmaware.org Arthritis Foundation: www.arthritis.org Document Released: 02/11/2005 Document Revised: 05/06/2011 Document Reviewed: 05/24/2009 Exit[employer] Patient Information 2015 [employer]. This information is not intended to replace advice given to you by your health care provider. Make sure you discuss any questions you have with your health care provider.  

## 2014-01-06 ENCOUNTER — Ambulatory Visit (INDEPENDENT_AMBULATORY_CARE_PROVIDER_SITE_OTHER): Payer: BC Managed Care – PPO

## 2014-01-06 ENCOUNTER — Encounter (INDEPENDENT_AMBULATORY_CARE_PROVIDER_SITE_OTHER): Payer: Self-pay

## 2014-01-06 ENCOUNTER — Ambulatory Visit (INDEPENDENT_AMBULATORY_CARE_PROVIDER_SITE_OTHER): Payer: BC Managed Care – PPO | Admitting: Neurology

## 2014-01-06 DIAGNOSIS — G43109 Migraine with aura, not intractable, without status migrainosus: Secondary | ICD-10-CM

## 2014-01-06 DIAGNOSIS — M5431 Sciatica, right side: Secondary | ICD-10-CM

## 2014-01-06 DIAGNOSIS — Z0289 Encounter for other administrative examinations: Secondary | ICD-10-CM

## 2014-01-06 DIAGNOSIS — R413 Other amnesia: Secondary | ICD-10-CM

## 2014-01-06 DIAGNOSIS — M797 Fibromyalgia: Secondary | ICD-10-CM

## 2014-01-06 NOTE — Procedures (Signed)
     HISTORY:  Kimberly Kent is a 58 year old patient with a history of back pain, and sciatica type pain down the right leg with some tingling sensations as well down the right leg. The patient has had MRI evaluation of the lumbosacral spine that does not show nerve root impingement. The patient being evaluated for this discomfort.  NERVE CONDUCTION STUDIES:  Nerve conduction studies were performed on the right upper extremity. The distal motor latencies and motor amplitudes for the median and ulnar nerves were within normal limits. The F wave latencies and nerve conduction velocities for these nerves were also normal. The sensory latencies for the median and ulnar nerves were normal.  Nerve conduction studies were performed on both lower extremities. The distal motor latencies and motor amplitudes for the peroneal and posterior tibial nerves were within normal limits. The nerve conduction velocities for these nerves were also normal. The H reflex latencies were normal. The sensory latencies for the peroneal nerves were within normal limits.   EMG STUDIES:   EMG study was performed on the right lower extremity:  The tibialis anterior muscle reveals 2 to 4K motor units with full recruitment. No fibrillations or positive waves were seen. The peroneus tertius muscle reveals 2 to 5K motor units with slightly decreased recruitment. No fibrillations or positive waves were seen. The medial gastrocnemius muscle reveals 1 to 3K motor units with full recruitment. No fibrillations or positive waves were seen. The vastus lateralis muscle reveals 2 to 5K motor units with slightly decreased recruitment. No fibrillations or positive waves were seen. The iliopsoas muscle reveals 2 to 4K motor units with full recruitment. No fibrillations or positive waves were seen. The biceps femoris muscle (long head) reveals 2 to 4K motor units with full recruitment. No fibrillations or positive waves were seen. The  lumbosacral paraspinal muscles were tested at 3 levels, and revealed no abnormalities of insertional activity at all 3 levels tested. There was good relaxation.   IMPRESSION:  Nerve conduction studies done on the right upper extremity and both lower extremities were unremarkable, without evidence of an overlying peripheral neuropathy. EMG evaluation of the right lower extremity was relatively unremarkable, without evidence of an overlying lumbosacral radiculopathy.  Jill Alexanders MD 01/06/2014 10:59 AM  [facility] [address] [place], [place] [address]  Phone [phone] Fax [phone]

## 2014-01-06 NOTE — Progress Notes (Signed)
Kimberly Kent is a 58 year old patient with a history of fibromyalgia who reports some back pain and some discomfort going down the right leg associated with a sciatica type syndrome. The patient has had a relatively unremarkable MRI the cervical spine, and she comes in for EMG and nerve conduction study evaluation.  Nerve conductions of the right arm and both legs were relatively unremarkable. EMG of the right leg was unremarkable.  Source of the pain involving the back and right leg is not clear, could be related to a S/I joint dysfunction. We may consider injection of this joint to see if the pain improves.  I have called patient with results of the test, if she is amenable to having an SI joint injection, she will call our office.

## 2014-01-17 ENCOUNTER — Telehealth: Payer: Self-pay | Admitting: Neurology

## 2014-01-17 NOTE — Telephone Encounter (Signed)
Patient calling to request MRI results, please call and advise.

## 2014-01-18 NOTE — Telephone Encounter (Signed)
Dr. Jannifer Franklin  It looks like a normal result.

## 2014-01-18 NOTE — Telephone Encounter (Signed)
I called the patient. The MRI results of the brain were released on my chart. The MRI was normal. The patient likely has a fibromyalgia syndrome.

## 2014-01-30 ENCOUNTER — Other Ambulatory Visit: Payer: Self-pay | Admitting: Internal Medicine

## 2014-02-02 ENCOUNTER — Other Ambulatory Visit: Payer: Self-pay | Admitting: Internal Medicine

## 2014-02-15 LAB — HM PAP SMEAR: HPV, high-risk: NEGATIVE

## 2014-02-21 ENCOUNTER — Other Ambulatory Visit: Payer: Self-pay | Admitting: Internal Medicine

## 2014-03-06 ENCOUNTER — Ambulatory Visit (INDEPENDENT_AMBULATORY_CARE_PROVIDER_SITE_OTHER): Payer: BLUE CROSS/BLUE SHIELD | Admitting: Internal Medicine

## 2014-03-06 VITALS — BP 108/70 | HR 75 | Temp 98.0°F | Resp 16 | Ht 66.0 in | Wt 197.2 lb

## 2014-03-06 DIAGNOSIS — R634 Abnormal weight loss: Secondary | ICD-10-CM

## 2014-03-06 DIAGNOSIS — E559 Vitamin D deficiency, unspecified: Secondary | ICD-10-CM

## 2014-03-06 DIAGNOSIS — M545 Low back pain, unspecified: Secondary | ICD-10-CM

## 2014-03-06 DIAGNOSIS — M797 Fibromyalgia: Secondary | ICD-10-CM

## 2014-03-06 LAB — COMPREHENSIVE METABOLIC PANEL
ALBUMIN: 4.2 g/dL (ref 3.5–5.2)
ALT: 14 U/L (ref 0–35)
AST: 17 U/L (ref 0–37)
Alkaline Phosphatase: 59 U/L (ref 39–117)
BUN: 15 mg/dL (ref 6–23)
CALCIUM: 9.8 mg/dL (ref 8.4–10.5)
CO2: 27 meq/L (ref 19–32)
CREATININE: 0.66 mg/dL (ref 0.50–1.10)
Chloride: 102 mEq/L (ref 96–112)
GLUCOSE: 79 mg/dL (ref 70–99)
POTASSIUM: 4.3 meq/L (ref 3.5–5.3)
Sodium: 138 mEq/L (ref 135–145)
Total Bilirubin: 0.8 mg/dL (ref 0.2–1.2)
Total Protein: 6.7 g/dL (ref 6.0–8.3)

## 2014-03-06 LAB — POCT CBC
GRANULOCYTE PERCENT: 69.1 % (ref 37–80)
HEMATOCRIT: 40.3 % (ref 37.7–47.9)
HEMOGLOBIN: 13.2 g/dL (ref 12.2–16.2)
Lymph, poc: 1.5 (ref 0.6–3.4)
MCH, POC: 30.6 pg (ref 27–31.2)
MCHC: 32.9 g/dL (ref 31.8–35.4)
MCV: 93 fL (ref 80–97)
MID (CBC): 0.2 (ref 0–0.9)
MPV: 7.1 fL (ref 0–99.8)
POC GRANULOCYTE: 3.9 (ref 2–6.9)
POC LYMPH %: 26.8 % (ref 10–50)
POC MID %: 4.1 %M (ref 0–12)
Platelet Count, POC: 304 10*3/uL (ref 142–424)
RBC: 4.33 M/uL (ref 4.04–5.48)
RDW, POC: 12.4 %
WBC: 5.7 10*3/uL (ref 4.6–10.2)

## 2014-03-06 LAB — LIPID PANEL
CHOL/HDL RATIO: 3.3 ratio
Cholesterol: 235 mg/dL — ABNORMAL HIGH (ref 0–200)
HDL: 72 mg/dL (ref >39)
LDL Cholesterol: 154 mg/dL — ABNORMAL HIGH (ref 0–99)
Triglycerides: 45 mg/dL (ref <150)
VLDL: 9 mg/dL (ref 0–40)

## 2014-03-06 LAB — TSH: TSH: 1.189 u[IU]/mL (ref 0.350–4.500)

## 2014-03-06 LAB — POCT GLYCOSYLATED HEMOGLOBIN (HGB A1C): HEMOGLOBIN A1C: 5.1

## 2014-03-06 LAB — GLUCOSE, POCT (MANUAL RESULT ENTRY): POC GLUCOSE: 86 mg/dL (ref 70–99)

## 2014-03-06 MED ORDER — CYCLOBENZAPRINE HCL 10 MG PO TABS: 10.0000 mg | ORAL_TABLET | Freq: Three times a day (TID) | ORAL | Status: DC | PRN

## 2014-03-06 NOTE — Progress Notes (Signed)
   Subjective:    Patient ID: Kimberly Kent, female    DOB: 02-Jul-1955, 59 y.o.   MRN: [id number]  HPI Followed by rheumatology for fibromyalgia, no inflammatory diseases, neuro did complete w/up and found no muscle or nerve diseases. Has lost weight , increased exercise and wants lab work done, she is vit D deficient and shiuld be better. She looks great and moves easily.  CO pain from LS area in right buttock ! Year ago sed rate 52 high  Review of Systems     Objective:   Physical Exam  Constitutional: She is oriented to person, place, and time. She appears well-developed and well-nourished. No distress.  HENT:  Head: Normocephalic.  Eyes: Conjunctivae and EOM are normal. Pupils are equal, round, and reactive to light.  Neck: Normal range of motion. Thyromegaly present.  Cardiovascular: Normal rate.   Pulmonary/Chest: Effort normal.  Musculoskeletal: Normal range of motion. She exhibits tenderness.       Lumbar back: She exhibits tenderness and spasm. She exhibits normal range of motion, no bony tenderness, no swelling, no edema, no deformity, no laceration, no pain and normal pulse.       Arms: Neurological: She is alert and oriented to person, place, and time. She has normal reflexes. No cranial nerve deficit. She exhibits normal muscle tone. Coordination normal.  Psychiatric: She has a normal mood and affect. Her behavior is normal.   Results for orders placed or performed in visit on 03/06/14  POCT CBC  Result Value Ref Range   WBC 5.7 4.6 - 10.2 K/uL   Lymph, poc 1.5 0.6 - 3.4   POC LYMPH PERCENT 26.8 10 - 50 %L   MID (cbc) 0.2 0 - 0.9   POC MID % 4.1 0 - 12 %M   POC Granulocyte 3.9 2 - 6.9   Granulocyte percent 69.1 37 - 80 %G   RBC 4.33 4.04 - 5.48 M/uL   Hemoglobin 13.2 12.2 - 16.2 g/dL   HCT, POC 40.3 37.7 - 47.9 %   MCV 93.0 80 - 97 fL   MCH, POC 30.6 27 - 31.2 pg   MCHC 32.9 31.8 - 35.4 g/dL   RDW, POC 12.4 %   Platelet Count, POC 304 142 - 424 K/uL   MPV 7.1  0 - 99.8 fL  POCT glucose (manual entry)  Result Value Ref Range   POC Glucose 86 70 - 99 mg/dl  POCT glycosylated hemoglobin (Hb A1C)  Result Value Ref Range   Hemoglobin A1C 5.1           Assessment & Plan:  Vitamin D deficiency/High sed rate Right LS pain.Flexeril 10mg  prn

## 2014-03-06 NOTE — Patient Instructions (Addendum)
Vitamin D Deficiency Vitamin D is an important vitamin that your body needs. Having too little of it in your body is called a deficiency. A very bad deficiency can make your bones soft and can cause a condition called rickets.  Vitamin D is important to your body for different reasons, such as:   It helps your body absorb 2 minerals called calcium and phosphorus.  It helps make your bones healthy.  It may prevent some diseases, such as diabetes and multiple sclerosis.  It helps your muscles and heart. You can get vitamin D in several ways. It is a natural part of some foods. The vitamin is also added to some dairy products and cereals. Some people take vitamin D supplements. Also, your body makes vitamin D when you are in the sun. It changes the sun's rays into a form of the vitamin that your body can use. CAUSES   Not eating enough foods that contain vitamin D.  Not getting enough sunlight.  Having certain digestive system diseases that make it hard to absorb vitamin D. These diseases include Crohn's disease, chronic pancreatitis, and cystic fibrosis.  Having a surgery in which part of the stomach or small intestine is removed.  Being obese. Fat cells pull vitamin D out of your blood. That means that obese people may not have enough vitamin D left in their blood and in other body tissues.  Having chronic kidney or liver disease. RISK FACTORS Risk factors are things that make you more likely to develop a vitamin D deficiency. They include:  Being older.  Not being able to get outside very much.  Living in a nursing home.  Having had broken bones.  Having weak or thin bones (osteoporosis).  Having a disease or condition that changes how your body absorbs vitamin D.  Having dark skin.  Some medicines such as seizure medicines or steroids.  Being overweight or obese. SYMPTOMS Mild cases of vitamin D deficiency may not have any symptoms. If you have a very bad case, symptoms  may include:  Bone pain.  Muscle pain.  Falling often.  Broken bones caused by a minor injury, due to osteoporosis. DIAGNOSIS A blood test is the best way to tell if you have a vitamin D deficiency. TREATMENT Vitamin D deficiency can be treated in different ways. Treatment for vitamin D deficiency depends on what is causing it. Options include:  Taking vitamin D supplements.  Taking a calcium supplement. Your caregiver will suggest what dose is best for you. HOME CARE INSTRUCTIONS  Take any supplements that your caregiver prescribes. Follow the directions carefully. Take only the suggested amount.  Have your blood tested 2 months after you start taking supplements.  Eat foods that contain vitamin D. Healthy choices include:  Fortified dairy products, cereals, or juices. Fortified means vitamin D has been added to the food. Check the label on the package to be sure.  Fatty fish like salmon or trout.  Eggs.  Oysters.  Do not use a tanning bed.  Keep your weight at a healthy level. Lose weight if you need to.  Keep all follow-up appointments. Your caregiver will need to perform blood tests to make sure your vitamin D deficiency is going away. SEEK MEDICAL CARE IF:  You have any questions about your treatment.  You continue to have symptoms of vitamin D deficiency.  You have nausea or vomiting.  You are constipated.  You feel confused.  You have severe abdominal or back pain. MAKE   SURE YOU:  Understand these instructions.  Will watch your condition.  Will get help right away if you are not doing well or get worse. Document Released: 05/06/2011 Document Revised: 06/08/2012 Document Reviewed: 05/06/2011 [facility] Patient Information 2015 [place], [place]. This information is not intended to replace advice given to you by your health care provider. Make sure you discuss any questions you have with your health care provider. Influenza Influenza ("the flu") is a  viral infection of the respiratory tract. It occurs more often in winter months because people spend more time in close contact with one another. Influenza can make you feel very sick. Influenza easily spreads from person to person (contagious). CAUSES  Influenza is caused by a virus that infects the respiratory tract. You can catch the virus by breathing in droplets from an infected person's cough or sneeze. You can also catch the virus by touching something that was recently contaminated with the virus and then touching your mouth, nose, or eyes. RISKS AND COMPLICATIONS You may be at risk for a more severe case of influenza if you smoke cigarettes, have diabetes, have chronic heart disease (such as heart failure) or lung disease (such as asthma), or if you have a weakened immune system. Elderly people and pregnant women are also at risk for more serious infections. The most common problem of influenza is a lung infection (pneumonia). Sometimes, this problem can require emergency medical care and may be life threatening. SIGNS AND SYMPTOMS  Symptoms typically last 4 to 10 days and may include:  Fever.  Chills.  Headache, body aches, and muscle aches.  Sore throat.  Chest discomfort and cough.  Poor appetite.  Weakness or feeling tired.  Dizziness.  Nausea or vomiting. DIAGNOSIS  Diagnosis of influenza is often made based on your history and a physical exam. A nose or throat swab test can be done to confirm the diagnosis. TREATMENT  In mild cases, influenza goes away on its own. Treatment is directed at relieving symptoms. For more severe cases, your health care provider may prescribe antiviral medicines to shorten the sickness. Antibiotic medicines are not effective because the infection is caused by a virus, not by bacteria. HOME CARE INSTRUCTIONS  Take medicines only as directed by your health care provider.  Use a cool mist humidifier to make breathing easier.  Get plenty of  rest until your temperature returns to normal. This usually takes 3 to 4 days.  Drink enough fluid to keep your urine clear or pale yellow.  Cover yourmouth and nosewhen coughing or sneezing,and wash your handswellto prevent thevirusfrom spreading.  Stay homefromwork orschool untilthe fever is gonefor at least 62full day. PREVENTION  An annual influenza vaccination (flu shot) is the best way to avoid getting influenza. An annual flu shot is now routinely recommended for all adults in the [place] IF:  You experiencechest pain, yourcough worsens,or you producemore mucus.  Youhave nausea,vomiting, ordiarrhea.  Your fever returns or gets worse. SEEK IMMEDIATE MEDICAL CARE IF:  You havetrouble breathing, you become short of breath,or your skin ornails becomebluish.  You have severe painor stiffnessin the neck.  You develop a sudden headache, or pain in the face or ear.  You have nausea or vomiting that you cannot control. MAKE SURE YOU:   Understand these instructions.  Will watch your condition.  Will get help right away if you are not doing well or get worse. Document Released: 02/09/2000 Document Revised: 06/28/2013 Document Reviewed: 05/13/2011 [employer] Patient Information 2015 [place]. This information is not intended to replace advice given to you by your health care provider. Make sure you discuss any questions you have with your health care provider.  

## 2014-03-07 LAB — VITAMIN D 25 HYDROXY (VIT D DEFICIENCY, FRACTURES): VIT D 25 HYDROXY: 22 ng/mL — AB (ref 30–100)

## 2014-03-08 ENCOUNTER — Encounter: Payer: Self-pay | Admitting: Family Medicine

## 2014-03-21 ENCOUNTER — Other Ambulatory Visit: Payer: Self-pay | Admitting: Internal Medicine

## 2014-03-21 ENCOUNTER — Other Ambulatory Visit: Payer: Self-pay | Admitting: Physician Assistant

## 2014-03-23 ENCOUNTER — Telehealth: Payer: Self-pay

## 2014-03-23 MED ORDER — FLUOXETINE HCL 10 MG PO CAPS: 10.0000 mg | ORAL_CAPSULE | Freq: Every day | ORAL | Status: DC

## 2014-03-23 NOTE — Telephone Encounter (Signed)
Pt called to ask why prozac was not RFd but other med was. She was just seen by Dr Elder Cyphers specifically bc pharmacy told her she needed OV for RFs of both medications. Pt reported that she did discuss this med w/Dr Elder Cyphers along w/her other med conditions and that she has been stable on it for 9 yrs. In reviewing OV notes, I did see that pt listed prozac as one of the meds she was seeing Dr Elder Cyphers to RF. I am sending in a 90 day RF of prozac for her. Pt reported that she is making an appt to est care w/another provider w/in that time for CPE and will get more RFs then.

## 2014-04-12 ENCOUNTER — Other Ambulatory Visit: Payer: Self-pay | Admitting: Dermatology

## 2014-04-13 ENCOUNTER — Encounter: Payer: Self-pay | Admitting: Internal Medicine

## 2014-04-13 ENCOUNTER — Ambulatory Visit (INDEPENDENT_AMBULATORY_CARE_PROVIDER_SITE_OTHER): Payer: BLUE CROSS/BLUE SHIELD | Admitting: Internal Medicine

## 2014-04-13 VITALS — BP 121/76 | HR 73 | Temp 97.8°F | Resp 16 | Ht 66.0 in | Wt 185.0 lb

## 2014-04-13 DIAGNOSIS — R110 Nausea: Secondary | ICD-10-CM

## 2014-04-13 DIAGNOSIS — R1031 Right lower quadrant pain: Secondary | ICD-10-CM

## 2014-04-13 DIAGNOSIS — R1011 Right upper quadrant pain: Secondary | ICD-10-CM

## 2014-04-13 LAB — COMPREHENSIVE METABOLIC PANEL
ALBUMIN: 4.4 g/dL (ref 3.5–5.2)
ALT: 14 U/L (ref 0–35)
AST: 17 U/L (ref 0–37)
Alkaline Phosphatase: 59 U/L (ref 39–117)
BUN: 12 mg/dL (ref 6–23)
CALCIUM: 9.9 mg/dL (ref 8.4–10.5)
CHLORIDE: 104 meq/L (ref 96–112)
CO2: 26 mEq/L (ref 19–32)
CREATININE: 0.61 mg/dL (ref 0.50–1.10)
Glucose, Bld: 120 mg/dL — ABNORMAL HIGH (ref 70–99)
POTASSIUM: 3.9 meq/L (ref 3.5–5.3)
Sodium: 139 mEq/L (ref 135–145)
Total Bilirubin: 0.6 mg/dL (ref 0.2–1.2)
Total Protein: 6.8 g/dL (ref 6.0–8.3)

## 2014-04-13 LAB — POCT UA - MICROSCOPIC ONLY
BACTERIA, U MICROSCOPIC: NEGATIVE
CASTS, UR, LPF, POC: NEGATIVE
Crystals, Ur, HPF, POC: NEGATIVE
Epithelial cells, urine per micros: NEGATIVE
Mucus, UA: NEGATIVE
WBC, Ur, HPF, POC: NEGATIVE
Yeast, UA: NEGATIVE

## 2014-04-13 LAB — CBC WITH DIFFERENTIAL/PLATELET
BASOS PCT: 1 % (ref 0–1)
Basophils Absolute: 0.1 10*3/uL (ref 0.0–0.1)
EOS ABS: 0.2 10*3/uL (ref 0.0–0.7)
EOS PCT: 3 % (ref 0–5)
HCT: 37.6 % (ref 36.0–46.0)
Hemoglobin: 12.6 g/dL (ref 12.0–15.0)
Lymphocytes Relative: 31 % (ref 12–46)
Lymphs Abs: 1.6 10*3/uL (ref 0.7–4.0)
MCH: 30.6 pg (ref 26.0–34.0)
MCHC: 33.5 g/dL (ref 30.0–36.0)
MCV: 91.3 fL (ref 78.0–100.0)
MPV: 9.8 fL (ref 8.6–12.4)
Monocytes Absolute: 0.4 10*3/uL (ref 0.1–1.0)
Monocytes Relative: 7 % (ref 3–12)
Neutro Abs: 2.9 10*3/uL (ref 1.7–7.7)
Neutrophils Relative %: 58 % (ref 43–77)
Platelets: 288 10*3/uL (ref 150–400)
RBC: 4.12 MIL/uL (ref 3.87–5.11)
RDW: 13 % (ref 11.5–15.5)
WBC: 5 10*3/uL (ref 4.0–10.5)

## 2014-04-13 LAB — POCT URINALYSIS DIPSTICK
BILIRUBIN UA: NEGATIVE
Glucose, UA: NEGATIVE
KETONES UA: NEGATIVE
LEUKOCYTES UA: NEGATIVE
Nitrite, UA: NEGATIVE
PH UA: 6.5
Protein, UA: NEGATIVE
Spec Grav, UA: <=1.005
Urobilinogen, UA: 0.2

## 2014-04-13 MED ORDER — ONDANSETRON HCL 4 MG PO TABS: 4.0000 mg | ORAL_TABLET | Freq: Three times a day (TID) | ORAL | Status: DC | PRN

## 2014-04-13 MED ORDER — TRAMADOL HCL 50 MG PO TABS: 50.0000 mg | ORAL_TABLET | Freq: Four times a day (QID) | ORAL | Status: DC | PRN

## 2014-04-13 NOTE — Progress Notes (Signed)
Subjective:    Patient ID: Kimberly Kent, female    DOB: 1956/02/08, 59 y.o.   MRN: [id number]  HPI complaining of the onset one and half weeks ago with right flank pain that was dull and aching and occasionally sharp. This has progressed to include more severe pain particularly increasing after eating and associated with nausea but no vomiting. No dysuria or frequency but has the feeling of incomplete void and a strange pressure at times. No past history of kidney stones. The nausea and pain after eating seems to be associated with butter or fried foods. No past history of liver or gallbladder problems.  She has seen Dr. Olevia Perches recently for upper endoscopy to rule out hiatal hernia and everything was normal Lab work 03/06/2014 per Dr. Eliseo Squires a normal cmet and mild hyperlipidemia Thyroid with stable on medication  Patient Active Problem List   Diagnosis Date Noted  . Vitamin D deficiency 03/06/2014  . Fibromyalgia 12/29/2013  . Migraine with aura 05/20/2011  . Hypothyroid 05/20/2011  . SHORTNESS OF BREATH 12/22/2009  . TRANSIENT VISUAL LOSS 07/13/2008   Prior to Admission medications   Medication Sig Start Date End Date Taking? Authorizing Provider  cholecalciferol (VITAMIN D) 1000 UNITS tablet Take 1,000 Units by mouth daily.   Yes Historical Provider, MD  FLUoxetine (PROZAC) 10 MG capsule Take 1 capsule (10 mg total) by mouth daily. 03/23/14  Yes Orma Flaming, MD  SYNTHROID 75 MCG tablet TAKE 1 TABLET (75 MCG TOTAL) BY MOUTH DAILY. 03/22/14  Yes Mancel Bale, PA-C  VOLTAREN 1 % GEL  12/16/13  Yes Historical Provider, MD    Review of Systems No fever chills or night sweats No recent weight loss No chest pain or palpitations    Objective:   Physical Exam BP 121/76 mmHg  Pulse 73  Temp(Src) 97.8 F (36.6 C)  Resp 16  Ht $R'5\' 6"'Xb$  (1.676 m)  Wt 185 lb (83.915 kg)  BMI 29.87 kg/m2  SpO2 100% HEENT clear--both TMs normal. Heart regular Positive percussion tenderness right  flank Tender deep in the right upper quadrant to palpation without organomegaly or masses Sensitive in the right lower quadrant as well without rebound or guarding Extremities without edema  Results for orders placed or performed in visit on 04/13/14  POCT urinalysis dipstick  Result Value Ref Range   Color, UA yellow    Clarity, UA clear    Glucose, UA neg    Bilirubin, UA neg    Ketones, UA neg    Spec Grav, UA <=1.005    Blood, UA trace    pH, UA 6.5    Protein, UA neg    Urobilinogen, UA 0.2    Nitrite, UA neg    Leukocytes, UA Negative   POCT UA - Microscopic Only  Result Value Ref Range   WBC, Ur, HPF, POC neg    RBC, urine, microscopic 0-2    Bacteria, U Microscopic neg    Mucus, UA neg    Epithelial cells, urine per micros neg    Crystals, Ur, HPF, POC neg    Casts, Ur, LPF, POC neg    Yeast, UA neg         Assessment & Plan:  Problem #1 recurrent problems with abdominal pain Problem #2 recent right flank right upper quadrant and right lower quadrant abdominal pain exacerbated by fatty food intake  Urinalysis is dilute but does not suggest hematuria or pyuria at this point so no explanation for her  post void pressure symptoms Metabolic profile is pending. CBC is normal. Given her normal ultrasound/endoscopy in 2015 the next step would be gallbladder function study-HIDA  Addend=labs Results for orders placed or performed in visit on 04/13/14  CBC with Differential/Platelet  Result Value Ref Range   WBC 5.0 4.0 - 10.5 K/uL   RBC 4.12 3.87 - 5.11 MIL/uL   Hemoglobin 12.6 12.0 - 15.0 g/dL   HCT 37.6 36.0 - 46.0 %   MCV 91.3 78.0 - 100.0 fL   MCH 30.6 26.0 - 34.0 pg   MCHC 33.5 30.0 - 36.0 g/dL   RDW 13.0 11.5 - 15.5 %   Platelets 288 150 - 400 K/uL   MPV 9.8 8.6 - 12.4 fL   Neutrophils Relative % 58 43 - 77 %   Neutro Abs 2.9 1.7 - 7.7 K/uL   Lymphocytes Relative 31 12 - 46 %   Lymphs Abs 1.6 0.7 - 4.0 K/uL   Monocytes Relative 7 3 - 12 %   Monocytes  Absolute 0.4 0.1 - 1.0 K/uL   Eosinophils Relative 3 0 - 5 %   Eosinophils Absolute 0.2 0.0 - 0.7 K/uL   Basophils Relative 1 0 - 1 %   Basophils Absolute 0.1 0.0 - 0.1 K/uL   Smear Review Criteria for review not met   Comprehensive metabolic panel  Result Value Ref Range   Sodium 139 135 - 145 mEq/L   Potassium 3.9 3.5 - 5.3 mEq/L   Chloride 104 96 - 112 mEq/L   CO2 26 19 - 32 mEq/L   Glucose, Bld 120 (H) 70 - 99 mg/dL   BUN 12 6 - 23 mg/dL   Creat 0.61 0.50 - 1.10 mg/dL   Total Bilirubin 0.6 0.2 - 1.2 mg/dL   Alkaline Phosphatase 59 39 - 117 U/L   AST 17 0 - 37 U/L   ALT 14 0 - 35 U/L   Total Protein 6.8 6.0 - 8.3 g/dL   Albumin 4.4 3.5 - 5.2 g/dL   Calcium 9.9 8.4 - 10.5 mg/dL

## 2014-04-14 ENCOUNTER — Telehealth: Payer: Self-pay

## 2014-04-14 ENCOUNTER — Encounter: Payer: Self-pay | Admitting: Internal Medicine

## 2014-04-14 NOTE — Telephone Encounter (Signed)
Pt calling about labs. Looks like Dr. Laney Pastor, sent pt a MyChart message but pt has not been able to check it. Relayed message to pt.

## 2014-04-18 NOTE — Addendum Note (Signed)
Addended by: Jannette Spanner on: 04/18/2014 09:39 AM   Modules accepted: Orders

## 2014-04-28 ENCOUNTER — Ambulatory Visit (HOSPITAL_COMMUNITY)
Admission: RE | Admit: 2014-04-28 | Discharge: 2014-04-28 | Disposition: A | Discharge status: Home / Self Care | Payer: BLUE CROSS/BLUE SHIELD | Source: Ambulatory Visit | Attending: Internal Medicine | Admitting: Internal Medicine

## 2014-04-28 DIAGNOSIS — R110 Nausea: Secondary | ICD-10-CM | POA: Diagnosis not present

## 2014-04-28 DIAGNOSIS — R1011 Right upper quadrant pain: Secondary | ICD-10-CM | POA: Insufficient documentation

## 2014-04-28 DIAGNOSIS — R1031 Right lower quadrant pain: Secondary | ICD-10-CM

## 2014-04-28 MED ORDER — SINCALIDE 5 MCG IJ SOLR
INTRAMUSCULAR | Status: AC
  Administered 2014-04-28: 4.14 ug via INTRAVENOUS
  Filled 2014-04-28: qty 5

## 2014-04-28 MED ORDER — TECHNETIUM TC 99M MEBROFENIN IV KIT
5.0000 | PACK | Freq: Once | INTRAVENOUS | Status: AC | PRN
  Administered 2014-04-28: 5 via INTRAVENOUS

## 2014-04-28 MED ORDER — SINCALIDE 5 MCG IJ SOLR
0.0200 ug/kg | Freq: Once | INTRAMUSCULAR | Status: AC
  Administered 2014-04-28: 4.14 ug via INTRAVENOUS

## 2014-04-30 ENCOUNTER — Encounter: Payer: Self-pay | Admitting: Internal Medicine

## 2014-06-19 ENCOUNTER — Other Ambulatory Visit: Payer: Self-pay | Admitting: Internal Medicine

## 2014-06-20 NOTE — Telephone Encounter (Signed)
Dr Elder Cyphers, you saw pt for check up in Jan, but don't see this discussed. OK to RF?

## 2014-08-04 ENCOUNTER — Encounter: Payer: Self-pay | Admitting: *Deleted

## 2014-08-12 ENCOUNTER — Other Ambulatory Visit: Payer: Self-pay | Admitting: Physician Assistant

## 2014-09-21 ENCOUNTER — Ambulatory Visit (INDEPENDENT_AMBULATORY_CARE_PROVIDER_SITE_OTHER): Payer: BLUE CROSS/BLUE SHIELD | Admitting: Family Medicine

## 2014-09-21 ENCOUNTER — Ambulatory Visit (INDEPENDENT_AMBULATORY_CARE_PROVIDER_SITE_OTHER): Payer: BLUE CROSS/BLUE SHIELD

## 2014-09-21 VITALS — BP 124/80 | HR 76 | Temp 98.4°F | Resp 16 | Ht 67.0 in | Wt 168.2 lb

## 2014-09-21 DIAGNOSIS — Z131 Encounter for screening for diabetes mellitus: Secondary | ICD-10-CM

## 2014-09-21 DIAGNOSIS — R053 Chronic cough: Secondary | ICD-10-CM

## 2014-09-21 DIAGNOSIS — Z8639 Personal history of other endocrine, nutritional and metabolic disease: Secondary | ICD-10-CM

## 2014-09-21 DIAGNOSIS — R05 Cough: Secondary | ICD-10-CM

## 2014-09-21 DIAGNOSIS — M5441 Lumbago with sciatica, right side: Secondary | ICD-10-CM | POA: Diagnosis not present

## 2014-09-21 DIAGNOSIS — F428 Other obsessive-compulsive disorder: Secondary | ICD-10-CM

## 2014-09-21 DIAGNOSIS — Z0000 Encounter for general adult medical examination without abnormal findings: Secondary | ICD-10-CM | POA: Diagnosis not present

## 2014-09-21 DIAGNOSIS — F42 Obsessive-compulsive disorder: Secondary | ICD-10-CM | POA: Diagnosis not present

## 2014-09-21 DIAGNOSIS — Z809 Family history of malignant neoplasm, unspecified: Secondary | ICD-10-CM | POA: Diagnosis not present

## 2014-09-21 DIAGNOSIS — Z130 Encounter for screening for diseases of the blood and blood-forming organs and certain disorders involving the immune mechanism: Secondary | ICD-10-CM | POA: Diagnosis not present

## 2014-09-21 DIAGNOSIS — Z13220 Encounter for screening for lipoid disorders: Secondary | ICD-10-CM

## 2014-09-21 DIAGNOSIS — E039 Hypothyroidism, unspecified: Secondary | ICD-10-CM | POA: Diagnosis not present

## 2014-09-21 LAB — CBC
HCT: 36.9 % (ref 36.0–46.0)
Hemoglobin: 12.5 g/dL (ref 12.0–15.0)
MCH: 30.6 pg (ref 26.0–34.0)
MCHC: 33.9 g/dL (ref 30.0–36.0)
MCV: 90.4 fL (ref 78.0–100.0)
MPV: 9.7 fL (ref 8.6–12.4)
PLATELETS: 250 10*3/uL (ref 150–400)
RBC: 4.08 MIL/uL (ref 3.87–5.11)
RDW: 12.6 % (ref 11.5–15.5)
WBC: 6.3 10*3/uL (ref 4.0–10.5)

## 2014-09-21 LAB — COMPREHENSIVE METABOLIC PANEL
ALT: 17 U/L (ref 6–29)
AST: 21 U/L (ref 10–35)
Albumin: 4.3 g/dL (ref 3.6–5.1)
Alkaline Phosphatase: 73 U/L (ref 33–130)
BUN: 10 mg/dL (ref 7–25)
CO2: 31 mEq/L (ref 20–31)
Calcium: 9.4 mg/dL (ref 8.6–10.4)
Chloride: 102 mEq/L (ref 98–110)
Creat: 0.64 mg/dL (ref 0.50–1.05)
GLUCOSE: 83 mg/dL (ref 65–99)
Potassium: 4.1 mEq/L (ref 3.5–5.3)
SODIUM: 141 meq/L (ref 135–146)
Total Bilirubin: 0.6 mg/dL (ref 0.2–1.2)
Total Protein: 6.6 g/dL (ref 6.1–8.1)

## 2014-09-21 LAB — LIPID PANEL
Cholesterol: 196 mg/dL (ref 125–200)
HDL: 69 mg/dL (ref >=46)
LDL CALC: 109 mg/dL (ref <130)
Total CHOL/HDL Ratio: 2.8 Ratio (ref <=5.0)
Triglycerides: 89 mg/dL (ref <150)
VLDL: 18 mg/dL (ref <30)

## 2014-09-21 LAB — TSH: TSH: 1.107 u[IU]/mL (ref 0.350–4.500)

## 2014-09-21 LAB — VITAMIN D 25 HYDROXY (VIT D DEFICIENCY, FRACTURES): Vit D, 25-Hydroxy: 32 ng/mL (ref 30–100)

## 2014-09-21 MED ORDER — BENZONATATE 100 MG PO CAPS: 100.0000 mg | ORAL_CAPSULE | Freq: Two times a day (BID) | ORAL | Status: DC | PRN

## 2014-09-21 MED ORDER — SYNTHROID 75 MCG PO TABS: 75.0000 ug | ORAL_TABLET | Freq: Every day | ORAL | Status: DC

## 2014-09-21 MED ORDER — FLUOXETINE HCL 10 MG PO CAPS: 10.0000 mg | ORAL_CAPSULE | Freq: Every day | ORAL | Status: DC

## 2014-09-21 NOTE — Progress Notes (Signed)
[facility] [address] [place] [address] [phone]  Date:  09/21/2014   Name:  Kimberly Kent   DOB:  27-Jul-1955   MRN:  [id number]  PCP:  Kennon Portela, MD    Chief Complaint: Annual Exam   History of Present Illness:  Kimberly Kent is a 59 y.o. very pleasant female patient who presents with the following:  She is here for her physical that she has to do for work.  She had been a pt of Dr. Elder Cyphers but is transfering to me as he is retiring soon. She is fasting today for labs Wt Readings from Last 3 Encounters:  09/21/14 168 lb 3.2 oz (76.295 kg)  04/13/14 185 lb (83.915 kg)  03/06/14 197 lb 3.2 oz (89.449 kg)   She has lost about 35 lbs through diet and exercise.  Her max weight was 201 by her scale. She is doing a program with a group which has really helped.  Her FBM symptoms are gone!  Dr. Estanislado Pandy has told her that she no longer carries this dx.   She has noted some URI sx for the last 3-4 days with ST, cough.  The cough can be productive at times.  No fever She also notes that she tends to have a somewhat chronic cough- she is not sure if something else could be going on here.  She is a non- smoker  She is on fluoxetine for OCD sx, vit D OTC, and synthroid Her mother died of melanoma at age of 41.  Kimberly Kent is careful to see her derm regulalry.   She has had some steroid injections in her right lower back- these have been through her rheumatologist Dr. Estanislado Pandy.  She is worried because her back continues to hurt.  Last MRI about 2 years ago  She sees OBGYN for her well woman care  She does not have migraine HA anymore either since she has changed her diet  Patient Active Problem List   Diagnosis Date Noted  . Vitamin D deficiency 03/06/2014  . Fibromyalgia 12/29/2013  . Migraine with aura 05/20/2011  . Hypothyroid 05/20/2011  . SHORTNESS OF BREATH 12/22/2009  . TRANSIENT VISUAL LOSS 07/13/2008    Past Medical History  Diagnosis Date   . Thyroid disease   . Arthritis   . Neuromuscular disorder   . Anemia   . Anxiety   . Blood transfusion without reported diagnosis   . Hyperplastic colon polyp   . Migraines   . Depression   . Vitamin D deficiency   . Patent foramen ovale   . Fibromyalgia 12/29/2013    Past Surgical History  Procedure Laterality Date  . Abdominal hysterectomy    . Knee arthroscopy      right knee/arthroscopic  . Tonsillectomy      History  Substance Use Topics  . Smoking status: Never Smoker   . Smokeless tobacco: Never Used  . Alcohol Use: No    Family History  Problem Relation Age of Onset  . Cancer Mother   . Cancer Father   . Diabetes Father   . Parkinson's disease Maternal Grandmother   . Cancer Maternal Grandfather     Allergies  Allergen Reactions  . Bupropion Hcl     REACTION: hives  . Codeine     REACTION: nausea and vomiting    Medication list has been reviewed and updated.  Current Outpatient Prescriptions on File Prior to Visit  Medication Sig  Dispense Refill  . cholecalciferol (VITAMIN D) 1000 UNITS tablet Take 1,000 Units by mouth daily.    Marland Kitchen FLUoxetine (PROZAC) 10 MG capsule TAKE ONE CAPSULE BY MOUTH EVERY DAY 90 capsule 0  . SYNTHROID 75 MCG tablet TAKE 1 TABLET BY MOUTH EVERY DAY 90 tablet 0  . ondansetron (ZOFRAN) 4 MG tablet Take 1 tablet (4 mg total) by mouth every 8 (eight) hours as needed for nausea or vomiting. (Patient not taking: Reported on 09/21/2014) 20 tablet 0  . traMADol (ULTRAM) 50 MG tablet Take 1-2 tablets (50-100 mg total) by mouth every 6 (six) hours as needed. (Patient not taking: Reported on 09/21/2014) 30 tablet 1  . VOLTAREN 1 % GEL   2   No current facility-administered medications on file prior to visit.    Review of Systems:  As per HPI- otherwise negative.   Physical Examination: Filed Vitals:   09/21/14 0822  BP: 124/80  Pulse: 76  Temp: 98.4 F (36.9 C)  Resp: 16   Filed Vitals:   09/21/14 0822  Height: 5\' 7"   (1.702 m)  Weight: 168 lb 3.2 oz (76.295 kg)   Body mass index is 26.34 kg/(m^2). Ideal Body Weight: Weight in (lb) to have BMI = 25: 159.3  GEN: WDWN, NAD, Non-toxic, A & O x 3, looks well HEENT: Atraumatic, Normocephalic. Neck supple. No masses, No LAD.  Bilateral TM wnl, oropharynx normal.  PEERL,EOMI.   Ears and Nose: No external deformity. CV: RRR, No M/G/R. No JVD. No thrill. No extra heart sounds. PULM: CTA B, no wheezes, crackles, rhonchi. No retractions. No resp. distress. No accessory muscle use. ABD: S, NT, ND, +BS. No rebound. No HSM. EXTR: No c/c/e NEURO Normal gait.  PSYCH: Normally interactive. Conversant. Not depressed or anxious appearing.  Calm demeanor.  She has mild tenderness in her right lower back and a "dimple" from her recent steroid injections  [facility] reading (PRIMARY) by  Dr. Lorelei Pont. CXR:  Negative  CHEST 2 VIEW  COMPARISON: Chest x-ray of 06/18/2013  FINDINGS: The lungs are clear and slightly hyperaerated. Mediastinal and hilar contours are unremarkable. The heart is within normal limits in size. No acute bony abnormality is seen.  IMPRESSION: No active cardiopulmonary disease.  Assessment and Plan: Hypothyroidism, unspecified hypothyroidism type - Plan: TSH, SYNTHROID 75 MCG tablet  Screening for deficiency anemia - Plan: CBC  Screening for diabetes mellitus - Plan: Comprehensive metabolic panel  Screening for hyperlipidemia - Plan: Lipid panel  History of vitamin D deficiency - Plan: Vitamin D, 25-hydroxy  Family history of cancer  Persistent cough - Plan: DG Chest 2 View, benzonatate (TESSALON) 100 MG capsule  Right-sided low back pain with right-sided sciatica - Plan: Ambulatory referral to Neurosurgery  Obsessive thinking - Plan: FLUoxetine (PROZAC) 10 MG capsule  Referral to NSG for an opinion about her back Await labs She will try tessalon for her cough, and will let me know if her sx persist   Signed Lamar Blinks,  MD

## 2014-09-21 NOTE — Patient Instructions (Addendum)
I will be in touch with your labs asap Great job with your diet and weight loss changes!  Keep up the good work.   You are due for a "tdap" tetanus shot.   We can do this for you when you are well, or this can be done at many drug stores as well I will refer you to neurosurgery about your back.  I am hopeful that they will suggest managing the pain without surgery but we will see if they have suggestions about management   Your chest x-ray looks fine. Use the tessalon as needed for cough and let me know if your symptoms do not improve soon You might experiment with taking a daily claritin or zyrtec for your more chronic cough symptoms.

## 2014-10-10 ENCOUNTER — Telehealth: Payer: Self-pay

## 2014-10-10 DIAGNOSIS — J209 Acute bronchitis, unspecified: Secondary | ICD-10-CM

## 2014-10-10 MED ORDER — AZITHROMYCIN 250 MG PO TABS: ORAL_TABLET | ORAL | Status: DC

## 2014-10-10 NOTE — Telephone Encounter (Signed)
Dr. Copland please advise 

## 2014-10-10 NOTE — Telephone Encounter (Signed)
Pt states her ear is stopped up and she still have a cough, would like to have something called in since she will be flying in 2 weeks and doesn't want to come back in Please call [phone]     [facility] [address]

## 2014-10-10 NOTE — Telephone Encounter (Signed)
Called her back- will treat with azithromycin.  She will let me know if she does not feel better

## 2014-11-10 ENCOUNTER — Other Ambulatory Visit: Payer: Self-pay | Admitting: Physician Assistant

## 2014-11-21 ENCOUNTER — Other Ambulatory Visit: Payer: Self-pay | Admitting: Physician Assistant

## 2015-08-03 ENCOUNTER — Ambulatory Visit (INDEPENDENT_AMBULATORY_CARE_PROVIDER_SITE_OTHER): Payer: BLUE CROSS/BLUE SHIELD | Admitting: Family Medicine

## 2015-08-03 ENCOUNTER — Encounter: Payer: Self-pay | Admitting: Family Medicine

## 2015-08-03 VITALS — BP 104/61 | HR 58 | Temp 97.8°F | Ht 66.5 in | Wt 179.6 lb

## 2015-08-03 DIAGNOSIS — E038 Other specified hypothyroidism: Secondary | ICD-10-CM | POA: Diagnosis not present

## 2015-08-03 DIAGNOSIS — R1011 Right upper quadrant pain: Secondary | ICD-10-CM

## 2015-08-03 DIAGNOSIS — H539 Unspecified visual disturbance: Secondary | ICD-10-CM

## 2015-08-03 DIAGNOSIS — Z131 Encounter for screening for diabetes mellitus: Secondary | ICD-10-CM | POA: Diagnosis not present

## 2015-08-03 DIAGNOSIS — Z13220 Encounter for screening for lipoid disorders: Secondary | ICD-10-CM | POA: Diagnosis not present

## 2015-08-03 LAB — COMPREHENSIVE METABOLIC PANEL
ALBUMIN: 4.2 g/dL (ref 3.5–5.2)
ALT: 18 U/L (ref 0–35)
AST: 22 U/L (ref 0–37)
Alkaline Phosphatase: 60 U/L (ref 39–117)
BUN: 19 mg/dL (ref 6–23)
CHLORIDE: 103 meq/L (ref 96–112)
CO2: 30 meq/L (ref 19–32)
CREATININE: 0.64 mg/dL (ref 0.40–1.20)
Calcium: 9.5 mg/dL (ref 8.4–10.5)
GFR: 101.48 mL/min (ref >60.00)
Glucose, Bld: 66 mg/dL — ABNORMAL LOW (ref 70–99)
Potassium: 4 mEq/L (ref 3.5–5.1)
SODIUM: 140 meq/L (ref 135–145)
Total Bilirubin: 0.6 mg/dL (ref 0.2–1.2)
Total Protein: 6.8 g/dL (ref 6.0–8.3)

## 2015-08-03 LAB — CBC WITH DIFFERENTIAL/PLATELET
BASOS PCT: 0.7 % (ref 0.0–3.0)
Basophils Absolute: 0 10*3/uL (ref 0.0–0.1)
EOS ABS: 0.1 10*3/uL (ref 0.0–0.7)
Eosinophils Relative: 2.7 % (ref 0.0–5.0)
HCT: 37.2 % (ref 36.0–46.0)
HEMOGLOBIN: 12.4 g/dL (ref 12.0–15.0)
Lymphocytes Relative: 25.9 % (ref 12.0–46.0)
Lymphs Abs: 1.3 10*3/uL (ref 0.7–4.0)
MCHC: 33.3 g/dL (ref 30.0–36.0)
MCV: 93 fl (ref 78.0–100.0)
MONO ABS: 0.4 10*3/uL (ref 0.1–1.0)
Monocytes Relative: 8.4 % (ref 3.0–12.0)
Neutro Abs: 3.1 10*3/uL (ref 1.4–7.7)
Neutrophils Relative %: 62.3 % (ref 43.0–77.0)
Platelets: 248 10*3/uL (ref 150.0–400.0)
RBC: 4 Mil/uL (ref 3.87–5.11)
RDW: 12.8 % (ref 11.5–15.5)
WBC: 5 10*3/uL (ref 4.0–10.5)

## 2015-08-03 LAB — LIPASE: Lipase: 25 U/L (ref 11.0–59.0)

## 2015-08-03 LAB — LIPID PANEL
CHOL/HDL RATIO: 3
CHOLESTEROL: 202 mg/dL — AB (ref 0–200)
HDL: 69 mg/dL (ref >39.00)
LDL CALC: 124 mg/dL — AB (ref 0–99)
NonHDL: 133.35
Triglycerides: 47 mg/dL (ref 0.0–149.0)
VLDL: 9.4 mg/dL (ref 0.0–40.0)

## 2015-08-03 LAB — HEMOGLOBIN A1C: HEMOGLOBIN A1C: 5.2 % (ref 4.6–6.5)

## 2015-08-03 LAB — TSH: TSH: 1.16 u[IU]/mL (ref 0.35–4.50)

## 2015-08-03 NOTE — Progress Notes (Signed)
[place] [facility] [address], [place], [place] [address] [phone]  Date:  08/03/2015   Name:  Kimberly Kent   DOB:  1955-09-28   MRN:  [id number]  PCP:  Lamar Blinks, MD    Chief Complaint: Abdominal Pain   History of Present Illness:  Kimberly Kent is a 60 y.o. very pleasant female patient who presents with the following:  History of hypothyroidism.   Reports that seveal years ago she had ?an echo that showed "a little small hole in my heart, no one was worried about it." I cannot see these records on Epic.   Last seen by myself about a year ago.  Over the last 3-4 weeks she had a couple of episodes where she seemed to have a visual aura - like "tunnel vision"- that she assoicates with migraines.  The aura might last 20 minutes or so She would not have any slurred speech, numbness or difficutly using her body.  Nausea but no vomiting. She did have a subsequent HA one time when she had this aura. She has had these auras with HA in the past but has not had one in several years.   She has also noted some right upper quadrant pains and tenderness for about 2 weeks,incrased  burping and gas.  The pain is generally worse after eating, not worse with any particular foods.  However if she has just a protein shake she will not have any pain.  She had a similar issue a couple of years ago- at that time her [place] was negative for gallstones    Normal BM routine No vomiting Appetite is ok, but she does not feel as hungry as usual. Admits she may avoid eating due to fears of pain. Over the last 2 days she has changed her diet which has helped her avoid pains.    She also had an MRI of her back last week and is expecting these results tomorrow per ortho  She is not aware of any family history of gallstones.   Her momther died at age 51 with melaoma, her father died of cirrhosis A close friend recently died of ovarian cancer  Lab Results   Component Value Date   TSH 1.107 09/21/2014   Pulse Readings from Last 3 Encounters:  08/03/15 58  09/21/14 76  04/13/14 73   BP Readings from Last 3 Encounters:  08/03/15 104/61  09/21/14 124/80  04/13/14 121/76     Patient Active Problem List   Diagnosis Date Noted  . Vitamin D deficiency 03/06/2014  . Migraine with aura 05/20/2011  . Hypothyroid 05/20/2011  . SHORTNESS OF BREATH 12/22/2009  . TRANSIENT VISUAL LOSS 07/13/2008    Past Medical History  Diagnosis Date  . Thyroid disease   . Arthritis   . Neuromuscular disorder   . Anemia   . Anxiety   . Blood transfusion without reported diagnosis   . Hyperplastic colon polyp   . Migraines   . Depression   . Vitamin D deficiency   . Patent foramen ovale   . Fibromyalgia 12/29/2013    Past Surgical History  Procedure Laterality Date  . Abdominal hysterectomy    . Knee arthroscopy      right knee/arthroscopic  . Tonsillectomy      Social History  Substance Use Topics  . Smoking status: Never Smoker   . Smokeless tobacco: Never Used  . Alcohol Use: No  Family History  Problem Relation Age of Onset  . Cancer Mother   . Cancer Father   . Diabetes Father   . Parkinson's disease Maternal Grandmother   . Cancer Maternal Grandfather     Allergies  Allergen Reactions  . Bupropion Hcl     REACTION: hives  . Codeine     REACTION: nausea and vomiting    Medication list has been reviewed and updated.  Current Outpatient Prescriptions on File Prior to Visit  Medication Sig Dispense Refill  . azithromycin (ZITHROMAX) 250 MG tablet Take as a zpack 6 tablet 0  . benzonatate (TESSALON) 100 MG capsule Take 1 capsule (100 mg total) by mouth 2 (two) times daily as needed for cough. 20 capsule 0  . cholecalciferol (VITAMIN D) 1000 UNITS tablet Take 1,000 Units by mouth daily.    Marland Kitchen FLUoxetine (PROZAC) 10 MG capsule Take 1 capsule (10 mg total) by mouth daily. 90 capsule 3  . SYNTHROID 75 MCG tablet Take 1  tablet (75 mcg total) by mouth daily. 90 tablet 3  . VOLTAREN 1 % GEL   2   No current facility-administered medications on file prior to visit.    Review of Systems:  As per HPI- otherwise negative.   Physical Examination: Filed Vitals:   08/03/15 0906  BP: 104/61  Pulse: 58  Temp: 97.8 F (36.6 C)    Filed Vitals:   08/03/15 0906  Height: 5' 6.5" (1.689 m)  Weight: 179 lb 9.6 oz (81.466 kg)   Ideal Body Weight:    GEN: WDWN, NAD, Non-toxic, A & O x 3, looks well HEENT: Atraumatic, Normocephalic. Neck supple. No masses, No LAD.  Bilateral TM wnl, oropharynx normal.  PEERL,EOMI.   Ears and Nose: No external deformity. CV: RRR, No M/G/R. No JVD. No thrill. No extra heart sounds. PULM: CTA B, no wheezes, crackles, rhonchi. No retractions. No resp. distress. No accessory muscle use. ABD: S, ND, +BS. No rebound. No HSM.  She notes mild epigastric and RUQ tenderness.  She also does seem to have some muscular tenderness in her right side and right lower back  EXTR: No c/c/e NEURO Normal gait.  PSYCH: Normally interactive. Conversant. Not depressed or anxious appearing.  Calm demeanor.  Complete neuro exam including strength, DTR and sensation all limbs, strengtht and sensation of face, normal romberg testing. No meningismus  Assessment and Plan: Abdominal pain, right upper quadrant - Plan: CBC with Differential/Platelet, Comprehensive metabolic panel, CA 0000000, Lipase, US Abdomen Complete  Screening for hyperlipidemia - Plan: Lipid panel  Screening for diabetes mellitus - Plan: Comprehensive metabolic panel, Hemoglobin A1c  Other specified hypothyroidism - Plan: TSH  Visual aura  Here today with a couple of concerns. She has had migraine with aura in the past and had 2 episodes of aura over the last month.  For now we will observe as this does seem like a migraine, but she will let me know if any change or worsening in her sx Screening labs as above Will eval for possible  gallstones or other cause of belly pain with [place] and labs as above.  Cautioned her to seek care if she has any severe or persistent pain that could indicate acute cholecystitis   Signed Lamar Blinks, MD

## 2015-08-03 NOTE — Patient Instructions (Signed)
It was very nice to see you today We will get your labs today and check an ultrasound for you next week to look at your gallbladder and other abd organs I think that the visual symptoms you describe do indicate an aura, but let me know if this patten changes Until we get your ultrasound eat a light, low fat diet to take it easy on your gallbladder- if you have any severe or persistent pain go to the ER

## 2015-08-03 NOTE — Progress Notes (Signed)
Pre visit review using our clinic review tool, if applicable. No additional management support is needed unless otherwise documented below in the visit note. 

## 2015-08-04 ENCOUNTER — Encounter: Payer: Self-pay | Admitting: Family Medicine

## 2015-08-04 LAB — CA 125: CA 125: 9 U/mL (ref <35)

## 2015-08-07 ENCOUNTER — Ambulatory Visit (HOSPITAL_BASED_OUTPATIENT_CLINIC_OR_DEPARTMENT_OTHER)
Admission: RE | Admit: 2015-08-07 | Discharge: 2015-08-07 | Disposition: A | Discharge status: Home / Self Care | Payer: BLUE CROSS/BLUE SHIELD | Source: Ambulatory Visit | Attending: Family Medicine | Admitting: Family Medicine

## 2015-08-07 ENCOUNTER — Encounter: Payer: Self-pay | Admitting: Family Medicine

## 2015-08-07 DIAGNOSIS — R1011 Right upper quadrant pain: Secondary | ICD-10-CM | POA: Diagnosis not present

## 2015-09-12 ENCOUNTER — Encounter: Payer: Self-pay | Admitting: Family Medicine

## 2015-09-12 DIAGNOSIS — F428 Other obsessive-compulsive disorder: Secondary | ICD-10-CM

## 2015-09-12 MED ORDER — FLUOXETINE HCL 10 MG PO CAPS: 10.0000 mg | ORAL_CAPSULE | Freq: Every day | ORAL | Status: DC

## 2015-10-13 ENCOUNTER — Telehealth: Payer: Self-pay | Admitting: *Deleted

## 2015-10-13 DIAGNOSIS — F428 Other obsessive-compulsive disorder: Secondary | ICD-10-CM

## 2015-10-13 NOTE — Telephone Encounter (Signed)
Received fax from [facility] requesting rx for fluoxetine 10mg . Last pt email 09/11/15 [facility]. Sent message to pt to verify request.

## 2015-10-17 ENCOUNTER — Other Ambulatory Visit: Payer: Self-pay | Admitting: Emergency Medicine

## 2015-10-17 NOTE — Telephone Encounter (Signed)
Tried to contact pt to see if she picked up [facility] or if she still needs the rx sent to [facility]. No answer, left message for pt to return call.

## 2015-10-17 NOTE — Telephone Encounter (Signed)
Pt called back and states that she was out of town and has not [facility] yet to [facility] yet but plans to go later today. [facility] if her insurance covers it, if not pt will contact us and we will send the refill request to [facility]. Any refills going forward will need to be sent to [facility] in order to be covered by pt's insurance.

## 2015-10-18 MED ORDER — FLUOXETINE HCL 10 MG PO CAPS: 10.0000 mg | ORAL_CAPSULE | Freq: Every day | ORAL | 1 refills | Status: DC

## 2015-10-18 NOTE — Addendum Note (Signed)
Addended by: Kelle Darting A on: 10/18/2015 08:21 AM   Modules accepted: Orders

## 2015-10-31 ENCOUNTER — Other Ambulatory Visit: Payer: Self-pay | Admitting: Family Medicine

## 2015-10-31 DIAGNOSIS — E039 Hypothyroidism, unspecified: Secondary | ICD-10-CM

## 2015-10-31 MED ORDER — SYNTHROID 75 MCG PO TABS: 75.0000 ug | ORAL_TABLET | Freq: Every day | ORAL | 1 refills | Status: DC

## 2015-10-31 NOTE — Telephone Encounter (Signed)
[facility] Notified pt and she requests that rx also be sent to [facility]. Refill sent.

## 2015-10-31 NOTE — Telephone Encounter (Signed)
Caller name: Relationship to patient: Self  Can be reached: [phone]  [id number] [place], [place] [phone] (Fax)     Reason for call: Refill on SYNTHROID 75 MCG tablet [id number] Patient is currently out and has not taken it in the past 3 days

## 2016-03-15 ENCOUNTER — Other Ambulatory Visit: Payer: Self-pay | Admitting: Family

## 2016-03-15 DIAGNOSIS — F428 Other obsessive-compulsive disorder: Secondary | ICD-10-CM

## 2016-03-15 NOTE — Telephone Encounter (Signed)
Fluoxetine sent to [facility], #90 x no refills. Pt last seen 08/02/15. Please advise when pt should follow up with you in the office?

## 2016-03-20 NOTE — Telephone Encounter (Signed)
Tiffany-- please call pt to schedule appt with Dr Lorelei Pont in the next couple of months. Thanks!   Darreld Mclean, MD  You; Emi Holes, CMA 4 days ago    Please ask her to schedule with me in the next couple of months. Thank you!

## 2016-03-20 NOTE — Telephone Encounter (Signed)
Patient scheduled follow up for 05/13/16 at 1pm

## 2016-03-28 ENCOUNTER — Other Ambulatory Visit: Payer: Self-pay | Admitting: Family Medicine

## 2016-03-28 DIAGNOSIS — E039 Hypothyroidism, unspecified: Secondary | ICD-10-CM

## 2016-05-10 ENCOUNTER — Encounter: Payer: Self-pay | Admitting: Family Medicine

## 2016-05-13 ENCOUNTER — Encounter: Payer: Self-pay | Admitting: Family Medicine

## 2016-05-13 ENCOUNTER — Ambulatory Visit (INDEPENDENT_AMBULATORY_CARE_PROVIDER_SITE_OTHER): Payer: BLUE CROSS/BLUE SHIELD | Admitting: Family Medicine

## 2016-05-13 VITALS — BP 122/80 | HR 68 | Temp 97.5°F | Ht 66.5 in | Wt 192.8 lb

## 2016-05-13 DIAGNOSIS — E038 Other specified hypothyroidism: Secondary | ICD-10-CM | POA: Diagnosis not present

## 2016-05-13 DIAGNOSIS — Z5181 Encounter for therapeutic drug level monitoring: Secondary | ICD-10-CM

## 2016-05-13 DIAGNOSIS — G8929 Other chronic pain: Secondary | ICD-10-CM

## 2016-05-13 DIAGNOSIS — Z23 Encounter for immunization: Secondary | ICD-10-CM | POA: Diagnosis not present

## 2016-05-13 DIAGNOSIS — R5382 Chronic fatigue, unspecified: Secondary | ICD-10-CM

## 2016-05-13 DIAGNOSIS — Z1159 Encounter for screening for other viral diseases: Secondary | ICD-10-CM

## 2016-05-13 DIAGNOSIS — M5441 Lumbago with sciatica, right side: Secondary | ICD-10-CM | POA: Diagnosis not present

## 2016-05-13 DIAGNOSIS — F428 Other obsessive-compulsive disorder: Secondary | ICD-10-CM

## 2016-05-13 DIAGNOSIS — E559 Vitamin D deficiency, unspecified: Secondary | ICD-10-CM

## 2016-05-13 DIAGNOSIS — Z0000 Encounter for general adult medical examination without abnormal findings: Secondary | ICD-10-CM

## 2016-05-13 LAB — COMPREHENSIVE METABOLIC PANEL
ALT: 20 U/L (ref 0–35)
AST: 23 U/L (ref 0–37)
Albumin: 4.4 g/dL (ref 3.5–5.2)
Alkaline Phosphatase: 66 U/L (ref 39–117)
BUN: 16 mg/dL (ref 6–23)
CO2: 28 mEq/L (ref 19–32)
Calcium: 10 mg/dL (ref 8.4–10.5)
Chloride: 103 mEq/L (ref 96–112)
Creatinine, Ser: 0.61 mg/dL (ref 0.40–1.20)
GFR: 106.97 mL/min (ref >60.00)
GLUCOSE: 87 mg/dL (ref 70–99)
POTASSIUM: 4.1 meq/L (ref 3.5–5.1)
Sodium: 140 mEq/L (ref 135–145)
Total Bilirubin: 0.5 mg/dL (ref 0.2–1.2)
Total Protein: 7 g/dL (ref 6.0–8.3)

## 2016-05-13 LAB — CBC
HCT: 37.8 % (ref 36.0–46.0)
Hemoglobin: 12.7 g/dL (ref 12.0–15.0)
MCHC: 33.6 g/dL (ref 30.0–36.0)
MCV: 92.2 fl (ref 78.0–100.0)
Platelets: 261 10*3/uL (ref 150.0–400.0)
RBC: 4.1 Mil/uL (ref 3.87–5.11)
RDW: 12.7 % (ref 11.5–15.5)
WBC: 5.7 10*3/uL (ref 4.0–10.5)

## 2016-05-13 LAB — HEPATITIS C ANTIBODY: HCV AB: NEGATIVE

## 2016-05-13 LAB — SEDIMENTATION RATE: Sed Rate: 9 mm/hr (ref 0–30)

## 2016-05-13 LAB — VITAMIN D 25 HYDROXY (VIT D DEFICIENCY, FRACTURES): VITD: 26.42 ng/mL — AB (ref 30.00–100.00)

## 2016-05-13 LAB — TSH: TSH: 1.24 u[IU]/mL (ref 0.35–4.50)

## 2016-05-13 MED ORDER — FLUOXETINE HCL 10 MG PO CAPS: 10.0000 mg | ORAL_CAPSULE | Freq: Every day | ORAL | 3 refills | Status: DC

## 2016-05-13 NOTE — Patient Instructions (Signed)
It was a pleasure to see you today as always.  I will be in touch with your labs asap You got your tetanus shot today- this is good for 10 years.   We will also do your one-time hepatitis C screening today

## 2016-05-13 NOTE — Progress Notes (Signed)
[facility] [address], [place], [place] [address] [phone]  Date:  05/13/2016   Name:  Kimberly Kent   DOB:  03-07-1955   MRN:  [id number]  PCP:  Lamar Blinks, MD    Chief Complaint: Follow-up   History of Present Illness:  Kimberly Kent is a 61 y.o. very pleasant female patient who presents with the following:  Here today for a follow-up visit History of hypothyroidism Last labs about 10 months ago  She does see GYN- Dr. Gita Kudo Lab Results  Component Value Date   TSH 1.16 08/03/2015   Feeling more tired and sluggish here lately (last 3 months).  No changes to hair.  No cold or heat intolerance.  Back pain will wake her up during the night.  Getting 7 hours per night.  Last eye exam 6 months ago.  Has notice vision up close in blurry again.  Having some inflammation lately (generalized) General muscle aches & stiffness, has increased in the last 4-5 months.    Has had a good appetite.  Has recently gained 20 lbs.    Had a bad fall in January in 2017.  Is having bad back pain.  Taking Advil PRN.  Is taking 1 gabapentin @ night.  Reports more sedentary lifestyle, since having back and hip pain.  Noticing increased urination (last 6-7 months).  Denies dysuria.  Last GYN ([facility]) exam 2 months ago, urine was clear.  Denies CP, changes to bowel habits.  Having some SOB when climbing 10+ stairs, walking 1.5 miles, patient attibutes this to lack of physical activity during the last year.  Has follow-up with Ortho (Dr. Noemi Chapel) in 2 weeks.  Also has appt with Neuro ([place]) regarding her back.  Requesting EKG.  Is fasting for labs today.  She is doing ok on her fluoxetine 10 mg once a day- she feels like this is working well for her in controlling her OCD tendencies.  She has thought about coming off this but has stuck with it since she tolerates it so well Lab Results  Component Value Date   HGBA1C 5.2  08/03/2015      Patient Active Problem List   Diagnosis Date Noted  . Vitamin D deficiency 03/06/2014  . Migraine with aura 05/20/2011  . Hypothyroid 05/20/2011  . SHORTNESS OF BREATH 12/22/2009  . TRANSIENT VISUAL LOSS 07/13/2008    Past Medical History:  Diagnosis Date  . Anemia   . Anxiety   . Arthritis   . Blood transfusion without reported diagnosis   . Depression   . Fibromyalgia 12/29/2013  . Hyperplastic colon polyp   . Migraines   . Neuromuscular disorder ([place])   . Patent foramen ovale   . Thyroid disease   . Vitamin D deficiency     Past Surgical History:  Procedure Laterality Date  . ABDOMINAL HYSTERECTOMY    . KNEE ARTHROSCOPY     right knee/arthroscopic  . TONSILLECTOMY      Social History  Substance Use Topics  . Smoking status: Never Smoker  . Smokeless tobacco: Never Used  . Alcohol use No    Family History  Problem Relation Age of Onset  . Cancer Mother   . Cancer Father   . Diabetes Father   . Parkinson's disease Maternal Grandmother   . Cancer Maternal Grandfather     Allergies  Allergen Reactions  . Bupropion Hcl     REACTION: hives  .  Codeine     REACTION: nausea and vomiting    Medication list has been reviewed and updated.  Current Outpatient Prescriptions on File Prior to Visit  Medication Sig Dispense Refill  . cholecalciferol (VITAMIN D) 1000 UNITS tablet Take 1,000 Units by mouth daily.    . Glucosamine-Chondroitin 1500-1200 MG/30ML LIQD Take by mouth.    . magnesium 30 MG tablet Take 30 mg by mouth 2 (two) times daily.    Marland Kitchen SYNTHROID 75 MCG tablet TAKE 1 TABLET DAILY 90 tablet 0   No current facility-administered medications on file prior to visit.     Review of Systems:  Review of Systems  Constitutional: Positive for malaise/fatigue. Negative for weight loss.       Positive for weight gain  Respiratory: Positive for shortness of breath. Negative for wheezing.   Cardiovascular: Negative for chest pain and  palpitations.  Gastrointestinal: Negative for abdominal pain, blood in stool, constipation, diarrhea, heartburn, melena, nausea and vomiting.  Genitourinary: Positive for frequency. Negative for dysuria.  Musculoskeletal: Positive for back pain, joint pain and myalgias.  Neurological: Negative for dizziness and headaches.     Physical Examination: Vitals:   05/13/16 1239  BP: 122/80  Pulse: 68  Temp: 97.5 F (36.4 C)   Vitals:   05/13/16 1239  Weight: 192 lb 12.8 oz (87.5 kg)  Height: 5' 6.5" (1.689 m)   Body mass index is 30.65 kg/m. Ideal Body Weight: Weight in (lb) to have BMI = 25: 156.9  Physical Examination: General appearance - alert, well appearing, and in no distress and oriented to person, place, and time Mental status - alert, oriented to person, place, and time, normal mood, behavior, speech, dress, motor activity, and thought processes Eyes - pupils equal and reactive, extraocular eye movements intact Ears - bilateral TM's and external ear canals normal Nose - normal and patent, no erythema, discharge or polyps Mouth - mucous membranes moist, pharynx normal without lesions Neck - supple, no significant adenopathy Lymphatics - no palpable lymphadenopathy, no hepatosplenomegaly Chest - clear to auscultation, no wheezes, rales or rhonchi, symmetric air entry Heart - normal rate, regular rhythm, normal S1, S2, no murmurs, rubs, clicks or gallops Abdomen - soft, nontender, nondistended, no masses or organomegaly Neurological - alert, oriented, normal speech, no focal findings or movement disorder noted Extremities - peripheral pulses normal, no pedal edema, no clubbing or cyanosis Skin - normal coloration and turgor, no rashes, no suspicious skin lesions noted,  EKG: essentially unchanged from comparison to last EKG in 2015, normal EKG. Assessment and Plan:  Physical exam  Other specified hypothyroidism - Plan: EKG 12-Lead, TSH, CANCELED: TSH  Chronic  right-sided low back pain with right-sided sciatica  Chronic fatigue - Plan: EKG 12-Lead, Sed Rate (ESR)  Vitamin D deficiency - Plan: Vitamin D (25 hydroxy), CANCELED: Vitamin D 1,25 dihydroxy  Encounter for hepatitis C screening test for low risk patient - Plan: Hepatitis C antibody  Immunization due - Plan: Tdap vaccine greater than or equal to 7yo IM  Medication monitoring encounter - Plan: Comprehensive metabolic panel, CBC  Obsessive thinking - Plan: FLUoxetine (PROZAC) 10 MG capsule  Here today for a follow-up visit tdap today Labs pending as above Refilled her prozac Noted oxygen sat of 68%- I feel confident that this is a typo as pt was in no distress. Will ask my CMA- suspect was actually 98% She would like to check a sed rate as she tends to have a lot of aches and pains, would  like to look for any autoimmune disorder    Results for orders placed or performed in visit on 05/13/16  Vitamin D (25 hydroxy)  Result Value Ref Range   VITD 26.42 (L) 30.00 - 100.00 ng/mL  TSH  Result Value Ref Range   TSH 1.24 0.35 - 4.50 uIU/mL  Sed Rate (ESR)  Result Value Ref Range   Sed Rate 9 0 - 30 mm/hr  Comprehensive metabolic panel  Result Value Ref Range   Sodium 140 135 - 145 mEq/L   Potassium 4.1 3.5 - 5.1 mEq/L   Chloride 103 96 - 112 mEq/L   CO2 28 19 - 32 mEq/L   Glucose, Bld 87 70 - 99 mg/dL   BUN 16 6 - 23 mg/dL   Creatinine, Ser 0.61 0.40 - 1.20 mg/dL   Total Bilirubin 0.5 0.2 - 1.2 mg/dL   Alkaline Phosphatase 66 39 - 117 U/L   AST 23 0 - 37 U/L   ALT 20 0 - 35 U/L   Total Protein 7.0 6.0 - 8.3 g/dL   Albumin 4.4 3.5 - 5.2 g/dL   Calcium 10.0 8.4 - 10.5 mg/dL   GFR 106.97 >60.00 mL/min  CBC  Result Value Ref Range   WBC 5.7 4.0 - 10.5 K/uL   RBC 4.10 3.87 - 5.11 Mil/uL   Platelets 261.0 150.0 - 400.0 K/uL   Hemoglobin 12.7 12.0 - 15.0 g/dL   HCT 37.8 36.0 - 46.0 %   MCV 92.2 78.0 - 100.0 fl   MCHC 33.6 30.0 - 36.0 g/dL   RDW 12.7 11.5 - 15.5 %     Patient Instructions:  It was a pleasure to see you today as always.  I will be in touch with your labs asap You got your tetanus shot today- this is good for 10 years.   We will also do your one-time hepatitis C screening today   Signed Lamar Blinks, MD

## 2016-05-14 ENCOUNTER — Encounter: Payer: Self-pay | Admitting: Family Medicine

## 2016-05-16 ENCOUNTER — Encounter (HOSPITAL_BASED_OUTPATIENT_CLINIC_OR_DEPARTMENT_OTHER): Payer: Self-pay | Admitting: *Deleted

## 2016-05-16 ENCOUNTER — Encounter: Payer: Self-pay | Admitting: Medical

## 2016-05-16 ENCOUNTER — Emergency Department (HOSPITAL_BASED_OUTPATIENT_CLINIC_OR_DEPARTMENT_OTHER)
Admission: EM | Admit: 2016-05-16 | Discharge: 2016-05-16 | Disposition: A | Discharge status: Home / Self Care | Payer: BLUE CROSS/BLUE SHIELD | Attending: Emergency Medicine | Admitting: Emergency Medicine

## 2016-05-16 ENCOUNTER — Ambulatory Visit (INDEPENDENT_AMBULATORY_CARE_PROVIDER_SITE_OTHER): Payer: BLUE CROSS/BLUE SHIELD | Admitting: Medical

## 2016-05-16 VITALS — BP 98/58 | HR 79 | Temp 100.5°F | Resp 16 | Ht 66.5 in | Wt 192.0 lb

## 2016-05-16 DIAGNOSIS — R509 Fever, unspecified: Secondary | ICD-10-CM | POA: Diagnosis present

## 2016-05-16 DIAGNOSIS — E860 Dehydration: Secondary | ICD-10-CM | POA: Diagnosis not present

## 2016-05-16 DIAGNOSIS — R112 Nausea with vomiting, unspecified: Secondary | ICD-10-CM | POA: Diagnosis not present

## 2016-05-16 DIAGNOSIS — K529 Noninfective gastroenteritis and colitis, unspecified: Secondary | ICD-10-CM | POA: Insufficient documentation

## 2016-05-16 DIAGNOSIS — R6889 Other general symptoms and signs: Secondary | ICD-10-CM

## 2016-05-16 DIAGNOSIS — E039 Hypothyroidism, unspecified: Secondary | ICD-10-CM | POA: Insufficient documentation

## 2016-05-16 DIAGNOSIS — R42 Dizziness and giddiness: Secondary | ICD-10-CM

## 2016-05-16 DIAGNOSIS — R300 Dysuria: Secondary | ICD-10-CM

## 2016-05-16 LAB — URINALYSIS, ROUTINE W REFLEX MICROSCOPIC
Bilirubin Urine: NEGATIVE
GLUCOSE, UA: NEGATIVE mg/dL
Hgb urine dipstick: NEGATIVE
KETONES UR: NEGATIVE mg/dL
LEUKOCYTES UA: NEGATIVE
NITRITE: NEGATIVE
Protein, ur: NEGATIVE mg/dL
SPECIFIC GRAVITY, URINE: 1.025 (ref 1.005–1.030)
pH: 8 (ref 5.0–8.0)

## 2016-05-16 LAB — POC INFLUENZA A&B (BINAX/QUICKVUE)
INFLUENZA A, POC: NEGATIVE
Influenza B, POC: NEGATIVE

## 2016-05-16 LAB — POCT RAPID STREP A (OFFICE): Rapid Strep A Screen: NEGATIVE

## 2016-05-16 MED ORDER — ONDANSETRON HCL 4 MG/2ML IJ SOLN
4.0000 mg | Freq: Once | INTRAMUSCULAR | Status: AC
  Administered 2016-05-16: 4 mg via INTRAVENOUS
  Filled 2016-05-16: qty 2

## 2016-05-16 MED ORDER — SODIUM CHLORIDE 0.9 % IV BOLUS (SEPSIS)
1000.0000 mL | Freq: Once | INTRAVENOUS | Status: AC
  Administered 2016-05-16: 1000 mL via INTRAVENOUS

## 2016-05-16 MED ORDER — ACETAMINOPHEN 325 MG PO TABS
650.0000 mg | ORAL_TABLET | Freq: Once | ORAL | Status: AC
  Administered 2016-05-16: 650 mg via ORAL
  Filled 2016-05-16: qty 2

## 2016-05-16 MED ORDER — ONDANSETRON 4 MG PO TBDP: 4.0000 mg | ORAL_TABLET | Freq: Three times a day (TID) | ORAL | 0 refills | Status: DC | PRN

## 2016-05-16 MED FILL — ONDANSETRON ODT 4 MG TABLET: 4 | 3 days supply | Qty: 9 | Fill #0

## 2016-05-16 NOTE — ED Notes (Signed)
Patient states this morning he woke up with sudden onset headache, emesis, and generalized body aches. Patient reports she has had @ 8 episodes of emesis between [phone] today. Patient states she was seen by PCP on Monday for physical and was deemed healthy. Patient states she presented to her PCP today for symptoms listed above and was tested for flu and strep, both negative. Patient sates she was sent to the ER by PCP for further eval and IV fluids. A&Ox4, NAD noted.

## 2016-05-16 NOTE — ED Provider Notes (Signed)
[facility] Provider Note   CSN: [id number] Arrival date & time: 05/16/16  1152     History   Chief Complaint Chief Complaint  Patient presents with  . Emesis    HPI Kimberly Kent is a 61 y.o. female.  Patient was seen by her PCP and sent down here for evaluation   The history is provided by the patient.  Emesis   This is a new problem. The current episode started 6 to 12 hours ago. The problem occurs 5 to 10 times per day. The problem has been gradually improving. The emesis has an appearance of stomach contents. The maximum temperature recorded prior to her arrival was 100 to 100.9 F. The fever has been present for less than 1 day. Associated symptoms include chills, diarrhea and myalgias. Pertinent negatives include no cough and no URI. Risk factors include ill contacts (No recent travel, antibiotics or bad food intake).    Past Medical History:  Diagnosis Date  . Anemia   . Anxiety   . Arthritis   . Blood transfusion without reported diagnosis   . Depression   . Fibromyalgia 12/29/2013  . Hyperplastic colon polyp   . Migraines   . Neuromuscular disorder ([place])   . Patent foramen ovale   . Thyroid disease   . Vitamin D deficiency     Patient Active Problem List   Diagnosis Date Noted  . Vitamin D deficiency 03/06/2014  . Migraine with aura 05/20/2011  . Hypothyroid 05/20/2011  . SHORTNESS OF BREATH 12/22/2009  . TRANSIENT VISUAL LOSS 07/13/2008    Past Surgical History:  Procedure Laterality Date  . ABDOMINAL HYSTERECTOMY    . KNEE ARTHROSCOPY     right knee/arthroscopic  . TONSILLECTOMY      OB History    No data available       Home Medications    Prior to Admission medications   Medication Sig Start Date End Date Taking? Authorizing Provider  cholecalciferol (VITAMIN D) 1000 UNITS tablet Take 1,000 Units by mouth daily.    Historical Provider, MD  FLUoxetine (PROZAC) 10 MG capsule Take 1 capsule (10 mg total) by mouth daily.  05/13/16   Gay Filler Copland, MD  gabapentin (NEURONTIN) 300 MG capsule Take 300 mg by mouth at bedtime.    Historical Provider, MD  Glucosamine-Chondroitin 1500-1200 MG/30ML LIQD Take by mouth.    Historical Provider, MD  magnesium 30 MG tablet Take 30 mg by mouth 2 (two) times daily.    Historical Provider, MD  Omega-3 Fatty Acids (FISH OIL PO) Take by mouth.    Historical Provider, MD  SYNTHROID 75 MCG tablet TAKE 1 TABLET DAILY 03/28/16   Darreld Mclean, MD    Family History Family History  Problem Relation Age of Onset  . Cancer Mother   . Cancer Father   . Diabetes Father   . Parkinson's disease Maternal Grandmother   . Cancer Maternal Grandfather     Social History Social History  Substance Use Topics  . Smoking status: Never Smoker  . Smokeless tobacco: Never Used  . Alcohol use No     Allergies   Bupropion hcl and Codeine   Review of Systems Review of Systems  Constitutional: Positive for chills.  Respiratory: Negative for cough.   Gastrointestinal: Positive for diarrhea and vomiting.  Musculoskeletal: Positive for myalgias.  All other systems reviewed and are negative.    Physical Exam Updated Vital Signs Ht 5\' 6"  (1.676 m)   Wt  192 lb (87.1 kg)   BMI 30.99 kg/m   Physical Exam  Constitutional: She is oriented to person, place, and time. She appears well-developed and well-nourished. No distress.  HENT:  Head: Normocephalic and atraumatic.  Mouth/Throat: Oropharynx is clear and moist.  Eyes: Conjunctivae and EOM are normal. Pupils are equal, round, and reactive to light.  Neck: Normal range of motion. Neck supple.  Cardiovascular: Normal rate, regular rhythm and intact distal pulses.   No murmur heard. Pulmonary/Chest: Effort normal and breath sounds normal. No respiratory distress. She has no wheezes. She has no rales.  Abdominal: Soft. She exhibits no distension. There is tenderness in the periumbilical area. There is no rebound and no guarding.    Mild. Umbilical tenderness but no rebound or guarding  Musculoskeletal: Normal range of motion. She exhibits no edema or tenderness.  Neurological: She is alert and oriented to person, place, and time.  Skin: Skin is warm and dry. No rash noted. No erythema.  Psychiatric: She has a normal mood and affect. Her behavior is normal.  Nursing note and vitals reviewed.    ED Treatments / Results  Labs (all labs ordered are listed, but only abnormal results are displayed) Labs Reviewed  URINALYSIS, ROUTINE W REFLEX MICROSCOPIC - Abnormal; Notable for the following:       Result Value   APPearance CLOUDY (*)    All other components within normal limits    EKG  EKG Interpretation None       Radiology No results found.  Procedures Procedures (including critical care time)  Medications Ordered in ED Medications  ondansetron (ZOFRAN) injection 4 mg (not administered)  sodium chloride 0.9 % bolus 1,000 mL (not administered)  acetaminophen (TYLENOL) tablet 650 mg (not administered)     Initial Impression / Assessment and Plan / ED Course  I have reviewed the triage vital signs and the nursing notes.  Pertinent labs & imaging results that were available during my care of the patient were reviewed by me and considered in my medical decision making (see chart for details).     Pt with symptoms most consistent with a viral process with fever/vomitting.  Denies bad food exposure and recent travel out of the country.  No recent abx.  No hx concerning for GU pathology or kidney stones.  Pt is awake and alert on exam without peritoneal signs. UA wnl. Pt given IVF and zofran/tylenol.  Will po challenge and re-eval  2:11 PM Pt feeling better and tolerating po's.   Final Clinical Impressions(s) / ED Diagnoses   Final diagnoses:  Gastroenteritis    New Prescriptions New Prescriptions   ONDANSETRON (ZOFRAN ODT) 4 MG DISINTEGRATING TABLET    Take 1 tablet (4 mg total) by mouth  every 8 (eight) hours as needed for nausea or vomiting.     Blanchie Dessert, MD 05/16/16 [phone]

## 2016-05-16 NOTE — Progress Notes (Signed)
Pre visit review using our clinic review tool, if applicable. No additional management support is needed unless otherwise documented below in the visit note/SLS  

## 2016-05-16 NOTE — Patient Instructions (Signed)
You may have viral illness(related to food you ate). With myalgias considered flu? but test was negative.  Concern for dehydration based on how you feel, severe vomiting, low bp and poor intake last 12 hours.  I called down stairs and talked with ED PA and notified of possible dehydration, viral illness and your dysuria. They may likely give iv fluids and do limited work up.  Follow up with [place] post ED evaluation as they determine.

## 2016-05-16 NOTE — Progress Notes (Signed)
Subjective:    Patient ID: Kimberly Kent, female    DOB: November 19, 1955, 61 y.o.   MRN: [id number]  HPI  Pt in with acute onset of nauseau and vomiting since this am early. Pt states vomited at least 6 times. Pt now able to keep some water down. Pt has some achiness that came on this am. No st. Pt ate steak and cheeses sandwich last night and felt queezy.   No fever, no chills or sweats.   Pt states episodes of feeling light headed and dizzy.   Today she just started with mild burning on urination.  Pt states one uti in her whole life.    Pt states very light headed and dizzy on standing and ambulation. Did not drink anything last night due to vomiting. Pt has mild ha.    Review of Systems  Constitutional: Negative for chills, fatigue and fever.  Respiratory: Negative for cough, chest tightness, shortness of breath and wheezing.   Gastrointestinal: Positive for abdominal pain, nausea and vomiting.  Genitourinary: Positive for dysuria. Negative for decreased urine volume, difficulty urinating, dyspareunia, urgency and vaginal pain.  Musculoskeletal: Positive for myalgias.  Skin: Negative for rash.  Neurological: Positive for dizziness and light-headedness. Negative for speech difficulty, weakness and headaches.  Hematological: Negative for adenopathy. Does not bruise/bleed easily.   Past Medical History:  Diagnosis Date  . Anemia   . Anxiety   . Arthritis   . Blood transfusion without reported diagnosis   . Depression   . Fibromyalgia 12/29/2013  . Hyperplastic colon polyp   . Migraines   . Neuromuscular disorder ([place])   . Patent foramen ovale   . Thyroid disease   . Vitamin D deficiency      Social History   Social History  . Marital status: Single    Spouse name: N/A  . Number of children: N/A  . Years of education: N/A   Occupational History  . mortgage industry [employer]    Social History Main Topics  . Smoking status: Never Smoker  . Smokeless tobacco:  Never Used  . Alcohol use No  . Drug use: No  . Sexual activity: Not on file   Other Topics Concern  . Not on file   Social History Narrative  . No narrative on file    Past Surgical History:  Procedure Laterality Date  . ABDOMINAL HYSTERECTOMY    . KNEE ARTHROSCOPY     right knee/arthroscopic  . TONSILLECTOMY      Family History  Problem Relation Age of Onset  . Cancer Mother   . Cancer Father   . Diabetes Father   . Parkinson's disease Maternal Grandmother   . Cancer Maternal Grandfather     Allergies  Allergen Reactions  . Bupropion Hcl     REACTION: hives  . Codeine     REACTION: nausea and vomiting    Current Outpatient Prescriptions on File Prior to Visit  Medication Sig Dispense Refill  . cholecalciferol (VITAMIN D) 1000 UNITS tablet Take 1,000 Units by mouth daily.    Marland Kitchen FLUoxetine (PROZAC) 10 MG capsule Take 1 capsule (10 mg total) by mouth daily. 90 capsule 3  . gabapentin (NEURONTIN) 300 MG capsule Take 300 mg by mouth at bedtime.    . Glucosamine-Chondroitin 1500-1200 MG/30ML LIQD Take by mouth.    . magnesium 30 MG tablet Take 30 mg by mouth 2 (two) times daily.    . Omega-3 Fatty Acids (FISH OIL PO) Take by  mouth.    . SYNTHROID 75 MCG tablet TAKE 1 TABLET DAILY 90 tablet 0   No current facility-administered medications on file prior to visit.     BP (!) 98/58 (BP Location: Right Arm, Patient Position: Lying left side, Cuff Size: Large) Comment: Lying  Pulse 79   Temp (!) 100.5 F (38.1 C) (Oral)   Resp 16   Ht 5' 6.5" (1.689 m)   Wt 192 lb (87.1 kg) Comment: reported  SpO2 96%   BMI 30.53 kg/m       Objective:   Physical Exam   General Appearance- Not in acute distress.  HEENT Eyes- Scleraeral/Conjuntiva-bilat- Not Yellow. Mouth & Throat- Normal.  Chest and Lung Exam Auscultation: Breath sounds:-Normal. Adventitious sounds:- No Adventitious sounds.  Cardiovascular Auscultation:Rythm - Regular. Heart Sounds -Normal heart  sounds.  Abdomen Inspection:-Inspection Normal.  Palpation/Perucssion: Palpation and Percussion of the abdomen reveal- Non Tender, No Rebound tenderness, No rigidity(Guarding) and No Palpable abdominal masses.  Liver:-Normal.  Spleen:- Normal.        Assessment & Plan:  You may have viral illness(related to food you ate). With myalgias considered flu? but test was negative.  Concern for dehydration based on how you feel, severe vomiting, low bp and poor intake last 12 hours.  I called down stairs and talked with ED PA and notified of possible dehydration, viral illness and your dysuria. They may likely give iv fluids and do limited work up.  Follow up with [place] post ED evaluation as they determine.  Randell Teare, Percell Miller, PA-C

## 2016-05-16 NOTE — ED Triage Notes (Signed)
Vomiting since 4:30 am. Body aches. Light headed. Headache. Dysuria. She saw her MD for a physical on Monday and everything looked good. She was seen today and had negative strep and flu test. Told to come here for further testing.

## 2016-05-31 DIAGNOSIS — Z6830 Body mass index (BMI) 30.0-30.9, adult: Secondary | ICD-10-CM | POA: Diagnosis not present

## 2016-05-31 DIAGNOSIS — M4726 Other spondylosis with radiculopathy, lumbar region: Secondary | ICD-10-CM | POA: Diagnosis not present

## 2016-05-31 DIAGNOSIS — M5416 Radiculopathy, lumbar region: Secondary | ICD-10-CM | POA: Diagnosis not present

## 2016-05-31 DIAGNOSIS — M5136 Other intervertebral disc degeneration, lumbar region: Secondary | ICD-10-CM | POA: Diagnosis not present

## 2016-07-15 DIAGNOSIS — L853 Xerosis cutis: Secondary | ICD-10-CM | POA: Diagnosis not present

## 2016-07-15 DIAGNOSIS — L821 Other seborrheic keratosis: Secondary | ICD-10-CM | POA: Diagnosis not present

## 2016-07-15 DIAGNOSIS — Z85828 Personal history of other malignant neoplasm of skin: Secondary | ICD-10-CM | POA: Diagnosis not present

## 2016-07-15 DIAGNOSIS — L910 Hypertrophic scar: Secondary | ICD-10-CM | POA: Diagnosis not present

## 2016-07-15 DIAGNOSIS — L820 Inflamed seborrheic keratosis: Secondary | ICD-10-CM | POA: Diagnosis not present

## 2016-08-12 DIAGNOSIS — H01005 Unspecified blepharitis left lower eyelid: Secondary | ICD-10-CM | POA: Diagnosis not present

## 2016-08-12 DIAGNOSIS — H01004 Unspecified blepharitis left upper eyelid: Secondary | ICD-10-CM | POA: Diagnosis not present

## 2016-08-12 DIAGNOSIS — H10412 Chronic giant papillary conjunctivitis, left eye: Secondary | ICD-10-CM | POA: Diagnosis not present

## 2016-08-21 DIAGNOSIS — H01004 Unspecified blepharitis left upper eyelid: Secondary | ICD-10-CM | POA: Diagnosis not present

## 2016-10-12 ENCOUNTER — Other Ambulatory Visit: Payer: Self-pay | Admitting: Family Medicine

## 2016-10-12 DIAGNOSIS — E039 Hypothyroidism, unspecified: Secondary | ICD-10-CM

## 2016-10-14 DIAGNOSIS — T1512XA Foreign body in conjunctival sac, left eye, initial encounter: Secondary | ICD-10-CM | POA: Diagnosis not present

## 2016-10-14 DIAGNOSIS — H10412 Chronic giant papillary conjunctivitis, left eye: Secondary | ICD-10-CM | POA: Diagnosis not present

## 2017-05-26 ENCOUNTER — Other Ambulatory Visit: Payer: Self-pay | Admitting: Family Medicine

## 2017-05-26 DIAGNOSIS — E039 Hypothyroidism, unspecified: Secondary | ICD-10-CM

## 2017-05-27 MED ORDER — SYNTHROID 75 MCG PO TABS: 75.0000 ug | ORAL_TABLET | Freq: Every day | ORAL | 0 refills | Status: DC

## 2017-06-03 NOTE — Progress Notes (Addendum)
[facility] [address], [place], [place] [address] [phone]  Date:  06/05/2017   Name:  Kimberly Kent   DOB:  1956-02-21   MRN:  [id number]  PCP:  Darreld Mclean, MD    Chief Complaint: Annual Exam (Pt here for CPE. Pt is fasting today )   History of Present Illness:  Kimberly Kent is a 62 y.o. very pleasant female patient who presents with the following:  Here today for a CPE I last saw her about a year ago History of hypothyroidism, vit D def, migraine headache Her headaches have been ok- she has stopped drinking diet coke which seems to have been a trigger for her  She has felt tired and run down for a couple of months- she is not sure why.  However we have not checked her TSH in a year so we will be sure to do this today She lost to 165 lbs 2 years ago but has gained some of this back- down a few lbs again since last year  Wt Readings from Last 3 Encounters:  06/05/17 186 lb 6.4 oz (84.6 kg)  05/16/16 192 lb (87.1 kg)  05/16/16 192 lb (87.1 kg)   BP Readings from Last 3 Encounters:  06/05/17 112/72  05/16/16 97/60  05/16/16 (!) 98/58    Lab Results  Component Value Date   TSH 1.24 05/13/2016   Pap: s/p hysterectomy- she sees Dr. Tera Helper [place] Mammo: 11/17, January of this year also per pt Colon: 2/11- she got a 10 year pass  Labs:  A year ago, she is fasting today  Shingrix: we talked about this today, but we do not have in stock. She will plan to get at some point in the future  She is not exercising that much as she is feeling so tired She does sleep but still will feel tried/ sleepy even when she wakes up and in the afternoon  She has noted a cough for a few months and wants to have a CXR- will get for her today.    No fever or chills No GI symptoms No CP or SOB  Patient Active Problem List   Diagnosis Date Noted  . Vitamin D deficiency 03/06/2014  . Migraine with aura 05/20/2011  .  Hypothyroid 05/20/2011  . SHORTNESS OF BREATH 12/22/2009  . TRANSIENT VISUAL LOSS 07/13/2008    Past Medical History:  Diagnosis Date  . Anemia   . Anxiety   . Arthritis   . Blood transfusion without reported diagnosis   . Depression   . Fibromyalgia 12/29/2013  . Hyperplastic colon polyp   . Migraines   . Neuromuscular disorder ([place])   . Patent foramen ovale   . Thyroid disease   . Vitamin D deficiency     Past Surgical History:  Procedure Laterality Date  . ABDOMINAL HYSTERECTOMY    . KNEE ARTHROSCOPY     right knee/arthroscopic  . TONSILLECTOMY      Social History   Tobacco Use  . Smoking status: Never Smoker  . Smokeless tobacco: Never Used  Substance Use Topics  . Alcohol use: No  . Drug use: No    Family History  Problem Relation Age of Onset  . Cancer Mother   . Cancer Father   . Diabetes Father   . Parkinson's disease Maternal Grandmother   . Cancer Maternal Grandfather     Allergies  Allergen Reactions  .  Bupropion Hcl     REACTION: hives  . Codeine     REACTION: nausea and vomiting    Medication list has been reviewed and updated.  Current Outpatient Medications on File Prior to Visit  Medication Sig Dispense Refill  . cholecalciferol (VITAMIN D) 1000 UNITS tablet Take 1,000 Units by mouth daily.    Marland Kitchen FLUoxetine (PROZAC) 10 MG capsule Take 1 capsule (10 mg total) by mouth daily. 90 capsule 3  . Glucosamine-Chondroitin 1500-1200 MG/30ML LIQD Take by mouth.    . magnesium 30 MG tablet Take 30 mg by mouth 2 (two) times daily.    Marland Kitchen SYNTHROID 75 MCG tablet Take 1 tablet (75 mcg total) by mouth daily before breakfast. 90 tablet 0   No current facility-administered medications on file prior to visit.     Review of Systems:  As per HPI- otherwise negative.   Physical Examination: Vitals:   06/05/17 1317  BP: 112/72  Pulse: 71  Temp: 98.3 F (36.8 C)  SpO2: 96%   Vitals:   06/05/17 1317  Weight: 186 lb 6.4 oz (84.6 kg)  Height: 5'  6.5" (1.689 m)   Body mass index is 29.64 kg/m. Ideal Body Weight: Weight in (lb) to have BMI = 25: 156.9  GEN: WDWN, NAD, Non-toxic, A & O x 3, overweight, looks well  HEENT: Atraumatic, Normocephalic. Neck supple. No masses, No LAD.  Bilateral TM wnl, oropharynx normal. PEERL,EOMI.   Ears and Nose: No external deformity. CV: RRR, No M/G/R. No JVD. No thrill. No extra heart sounds. PULM: CTA B, no wheezes, crackles, rhonchi. No retractions. No resp. distress. No accessory muscle use. ABD: S, NT, ND. No rebound. No HSM. EXTR: No c/c/e NEURO Normal gait.  PSYCH: Normally interactive. Conversant. Not depressed or anxious appearing.  Calm demeanor.    Assessment and Plan: Physical exam  Vitamin D deficiency - Plan: Vitamin D (25 hydroxy)  Screening for hyperlipidemia - Plan: Lipid panel  Screening for diabetes mellitus - Plan: Comprehensive metabolic panel, Hemoglobin A1c  Hypothyroidism, unspecified type - Plan: TSH  Screening for deficiency anemia - Plan: CBC  Persistent cough - Plan: DG Chest 2 View  Obsessive thinking - Plan: FLUoxetine (PROZAC) 10 MG capsule  CPE today Labs pending as above Refilled her prozac.  She has levothyroxine at the pharmacy but will wait to start it until her get her TSH back Will plan further follow- up pending labs and CXR If all her labs are normal will consider a sleep study for her   Signed Lamar Blinks, MD  Dg Chest 2 View  Result Date: 06/05/2017 CLINICAL DATA:  Chronic cough EXAM: CHEST - 2 VIEW COMPARISON:  September 21, 2014 FINDINGS: Lungs are clear. Heart size and pulmonary vascularity are normal. No adenopathy. No bone lesions. IMPRESSION: No edema or consolidation. Electronically Signed   By: Lowella Grip III M.D.   On: 06/05/2017 13:59   Message to pt- normal CXR   Received her labs 4/12, message to pt  Your blood count is normal Metabolic profile is normal A1c shows that your blood sugar is normal, no sign of diabetes  or pre-diabetes Cholesterol is overall good- your LDL is a bit high, but you have a very good HDL to balance this out Thyroid is normal, you can continue your current dose of thyroid medication Your vitamin D is slightly low; please increase your OTC supplement dose to 2,000 IU per day.   As your labs look good, we might consider a  sleep study to look for any cause of your fatigue.  Would you like to go ahead and do this? Results for orders placed or performed in visit on 06/05/17  CBC  Result Value Ref Range   WBC 5.8 4.0 - 10.5 K/uL   RBC 3.94 3.87 - 5.11 Mil/uL   Platelets 238.0 150.0 - 400.0 K/uL   Hemoglobin 12.6 12.0 - 15.0 g/dL   HCT 36.7 36.0 - 46.0 %   MCV 93.0 78.0 - 100.0 fl   MCHC 34.4 30.0 - 36.0 g/dL   RDW 13.4 11.5 - 15.5 %  Comprehensive metabolic panel  Result Value Ref Range   Sodium 138 135 - 145 mEq/L   Potassium 3.9 3.5 - 5.1 mEq/L   Chloride 103 96 - 112 mEq/L   CO2 29 19 - 32 mEq/L   Glucose, Bld 86 70 - 99 mg/dL   BUN 15 6 - 23 mg/dL   Creatinine, Ser 0.68 0.40 - 1.20 mg/dL   Total Bilirubin 0.7 0.2 - 1.2 mg/dL   Alkaline Phosphatase 52 39 - 117 U/L   AST 18 0 - 37 U/L   ALT 19 0 - 35 U/L   Total Protein 6.9 6.0 - 8.3 g/dL   Albumin 4.1 3.5 - 5.2 g/dL   Calcium 9.3 8.4 - 10.5 mg/dL   GFR 94.02 >60.00 mL/min  Hemoglobin A1c  Result Value Ref Range   Hgb A1c MFr Bld 5.4 4.6 - 6.5 %  Lipid panel  Result Value Ref Range   Cholesterol 208 (H) 0 - 200 mg/dL   Triglycerides 50.0 0.0 - 149.0 mg/dL   HDL 76.30 >39.00 mg/dL   VLDL 10.0 0.0 - 40.0 mg/dL   LDL Cholesterol 121 (H) 0 - 99 mg/dL   Total CHOL/HDL Ratio 3    NonHDL 131.37   TSH  Result Value Ref Range   TSH 1.66 0.35 - 4.50 uIU/mL  Vitamin D (25 hydroxy)  Result Value Ref Range   VITD 26.90 (L) 30.00 - 100.00 ng/mL

## 2017-06-05 ENCOUNTER — Encounter: Payer: Self-pay | Admitting: Family Medicine

## 2017-06-05 ENCOUNTER — Ambulatory Visit (INDEPENDENT_AMBULATORY_CARE_PROVIDER_SITE_OTHER): Payer: Managed Care, Other (non HMO) | Admitting: Family Medicine

## 2017-06-05 ENCOUNTER — Ambulatory Visit (HOSPITAL_BASED_OUTPATIENT_CLINIC_OR_DEPARTMENT_OTHER)
Admission: RE | Admit: 2017-06-05 | Discharge: 2017-06-05 | Disposition: A | Discharge status: Home / Self Care | Payer: Managed Care, Other (non HMO) | Source: Ambulatory Visit | Attending: Family Medicine | Admitting: Family Medicine

## 2017-06-05 VITALS — BP 112/72 | HR 71 | Temp 98.3°F | Ht 66.5 in | Wt 186.4 lb

## 2017-06-05 DIAGNOSIS — E039 Hypothyroidism, unspecified: Secondary | ICD-10-CM | POA: Diagnosis not present

## 2017-06-05 DIAGNOSIS — Z0000 Encounter for general adult medical examination without abnormal findings: Secondary | ICD-10-CM

## 2017-06-05 DIAGNOSIS — F428 Other obsessive-compulsive disorder: Secondary | ICD-10-CM | POA: Diagnosis not present

## 2017-06-05 DIAGNOSIS — E559 Vitamin D deficiency, unspecified: Secondary | ICD-10-CM | POA: Diagnosis not present

## 2017-06-05 DIAGNOSIS — Z130 Encounter for screening for diseases of the blood and blood-forming organs and certain disorders involving the immune mechanism: Secondary | ICD-10-CM

## 2017-06-05 DIAGNOSIS — Z131 Encounter for screening for diabetes mellitus: Secondary | ICD-10-CM | POA: Diagnosis not present

## 2017-06-05 DIAGNOSIS — Z13220 Encounter for screening for lipoid disorders: Secondary | ICD-10-CM

## 2017-06-05 DIAGNOSIS — R05 Cough: Secondary | ICD-10-CM

## 2017-06-05 DIAGNOSIS — R053 Chronic cough: Secondary | ICD-10-CM

## 2017-06-05 LAB — COMPREHENSIVE METABOLIC PANEL
ALT: 19 U/L (ref 0–35)
AST: 18 U/L (ref 0–37)
Albumin: 4.1 g/dL (ref 3.5–5.2)
Alkaline Phosphatase: 52 U/L (ref 39–117)
BILIRUBIN TOTAL: 0.7 mg/dL (ref 0.2–1.2)
BUN: 15 mg/dL (ref 6–23)
CO2: 29 meq/L (ref 19–32)
CREATININE: 0.68 mg/dL (ref 0.40–1.20)
Calcium: 9.3 mg/dL (ref 8.4–10.5)
Chloride: 103 mEq/L (ref 96–112)
GFR: 94.02 mL/min (ref >60.00)
GLUCOSE: 86 mg/dL (ref 70–99)
Potassium: 3.9 mEq/L (ref 3.5–5.1)
SODIUM: 138 meq/L (ref 135–145)
TOTAL PROTEIN: 6.9 g/dL (ref 6.0–8.3)

## 2017-06-05 LAB — LIPID PANEL
CHOL/HDL RATIO: 3
Cholesterol: 208 mg/dL — ABNORMAL HIGH (ref 0–200)
HDL: 76.3 mg/dL (ref >39.00)
LDL CALC: 121 mg/dL — AB (ref 0–99)
NONHDL: 131.37
Triglycerides: 50 mg/dL (ref 0.0–149.0)
VLDL: 10 mg/dL (ref 0.0–40.0)

## 2017-06-05 LAB — CBC
HCT: 36.7 % (ref 36.0–46.0)
HEMOGLOBIN: 12.6 g/dL (ref 12.0–15.0)
MCHC: 34.4 g/dL (ref 30.0–36.0)
MCV: 93 fl (ref 78.0–100.0)
PLATELETS: 238 10*3/uL (ref 150.0–400.0)
RBC: 3.94 Mil/uL (ref 3.87–5.11)
RDW: 13.4 % (ref 11.5–15.5)
WBC: 5.8 10*3/uL (ref 4.0–10.5)

## 2017-06-05 LAB — VITAMIN D 25 HYDROXY (VIT D DEFICIENCY, FRACTURES): VITD: 26.9 ng/mL — AB (ref 30.00–100.00)

## 2017-06-05 LAB — HEMOGLOBIN A1C: Hgb A1c MFr Bld: 5.4 % (ref 4.6–6.5)

## 2017-06-05 LAB — TSH: TSH: 1.66 u[IU]/mL (ref 0.35–4.50)

## 2017-06-05 MED ORDER — FLUOXETINE HCL 10 MG PO CAPS: 10.0000 mg | ORAL_CAPSULE | Freq: Every day | ORAL | 3 refills | Status: DC

## 2017-06-05 NOTE — Patient Instructions (Signed)
It was very nice to see you today- take care and I will be in touch with your labs and chest x-ray asap You may want to have the shingrix (shingles vaccine) at your convenience- perhaps at your physical next year we will have this back in stock!  If all your labs look good we might want to do a sleep study for you to make sure you don't have sleep apnea   Health Maintenance for Postmenopausal Women Menopause is a normal process in which your reproductive ability comes to an end. This process happens gradually over a span of months to years, usually between the ages of 87 and 31. Menopause is complete when you have missed 12 consecutive menstrual periods. It is important to talk with your health care provider about some of the most common conditions that affect postmenopausal women, such as heart disease, cancer, and bone loss (osteoporosis). Adopting a healthy lifestyle and getting preventive care can help to promote your health and wellness. Those actions can also lower your chances of developing some of these common conditions. What should I know about menopause? During menopause, you may experience a number of symptoms, such as:  Moderate-to-severe hot flashes.  Night sweats.  Decrease in sex drive.  Mood swings.  Headaches.  Tiredness.  Irritability.  Memory problems.  Insomnia.  Choosing to treat or not to treat menopausal changes is an individual decision that you make with your health care provider. What should I know about hormone replacement therapy and supplements? Hormone therapy products are effective for treating symptoms that are associated with menopause, such as hot flashes and night sweats. Hormone replacement carries certain risks, especially as you become older. If you are thinking about using estrogen or estrogen with progestin treatments, discuss the benefits and risks with your health care provider. What should I know about heart disease and stroke? Heart  disease, heart attack, and stroke become more likely as you age. This may be due, in part, to the hormonal changes that your body experiences during menopause. These can affect how your body processes dietary fats, triglycerides, and cholesterol. Heart attack and stroke are both medical emergencies. There are many things that you can do to help prevent heart disease and stroke:  Have your blood pressure checked at least every 1-2 years. High blood pressure causes heart disease and increases the risk of stroke.  If you are 60-40 years old, ask your health care provider if you should take aspirin to prevent a heart attack or a stroke.  Do not use any tobacco products, including cigarettes, chewing tobacco, or electronic cigarettes. If you need help quitting, ask your health care provider.  It is important to eat a healthy diet and maintain a healthy weight. ? Be sure to include plenty of vegetables, fruits, low-fat dairy products, and lean protein. ? Avoid eating foods that are high in solid fats, added sugars, or salt (sodium).  Get regular exercise. This is one of the most important things that you can do for your health. ? Try to exercise for at least 150 minutes each week. The type of exercise that you do should increase your heart rate and make you sweat. This is known as moderate-intensity exercise. ? Try to do strengthening exercises at least twice each week. Do these in addition to the moderate-intensity exercise.  Know your numbers.Ask your health care provider to check your cholesterol and your blood glucose. Continue to have your blood tested as directed by your health care provider.  What should I know about cancer screening? There are several types of cancer. Take the following steps to reduce your risk and to catch any cancer development as early as possible. Breast Cancer  Practice breast self-awareness. ? This means understanding how your breasts normally appear and feel. ? It  also means doing regular breast self-exams. Let your health care provider know about any changes, no matter how small.  If you are 3 or older, have a clinician do a breast exam (clinical breast exam or CBE) every year. Depending on your age, family history, and medical history, it may be recommended that you also have a yearly breast X-ray (mammogram).  If you have a family history of breast cancer, talk with your health care provider about genetic screening.  If you are at high risk for breast cancer, talk with your health care provider about having an MRI and a mammogram every year.  Breast cancer (BRCA) gene test is recommended for women who have family members with BRCA-related cancers. Results of the assessment will determine the need for genetic counseling and BRCA1 and for BRCA2 testing. BRCA-related cancers include these types: ? Breast. This occurs in males or females. ? Ovarian. ? Tubal. This may also be called fallopian tube cancer. ? Cancer of the abdominal or pelvic lining (peritoneal cancer). ? Prostate. ? Pancreatic.  Cervical, Uterine, and Ovarian Cancer Your health care provider may recommend that you be screened regularly for cancer of the pelvic organs. These include your ovaries, uterus, and vagina. This screening involves a pelvic exam, which includes checking for microscopic changes to the surface of your cervix (Pap test).  For women ages 21-65, health care providers may recommend a pelvic exam and a Pap test every three years. For women ages 68-65, they may recommend the Pap test and pelvic exam, combined with testing for human papilloma virus (HPV), every five years. Some types of HPV increase your risk of cervical cancer. Testing for HPV may also be done on women of any age who have unclear Pap test results.  Other health care providers may not recommend any screening for nonpregnant women who are considered low risk for pelvic cancer and have no symptoms. Ask your  health care provider if a screening pelvic exam is right for you.  If you have had past treatment for cervical cancer or a condition that could lead to cancer, you need Pap tests and screening for cancer for at least 20 years after your treatment. If Pap tests have been discontinued for you, your risk factors (such as having a new sexual partner) need to be reassessed to determine if you should start having screenings again. Some women have medical problems that increase the chance of getting cervical cancer. In these cases, your health care provider may recommend that you have screening and Pap tests more often.  If you have a family history of uterine cancer or ovarian cancer, talk with your health care provider about genetic screening.  If you have vaginal bleeding after reaching menopause, tell your health care provider.  There are currently no reliable tests available to screen for ovarian cancer.  Lung Cancer Lung cancer screening is recommended for adults 73-48 years old who are at high risk for lung cancer because of a history of smoking. A yearly low-dose CT scan of the lungs is recommended if you:  Currently smoke.  Have a history of at least 30 pack-years of smoking and you currently smoke or have quit within the past  15 years. A pack-year is smoking an average of one pack of cigarettes per day for one year.  Yearly screening should:  Continue until it has been 15 years since you quit.  Stop if you develop a health problem that would prevent you from having lung cancer treatment.  Colorectal Cancer  This type of cancer can be detected and can often be prevented.  Routine colorectal cancer screening usually begins at age 26 and continues through age 46.  If you have risk factors for colon cancer, your health care provider may recommend that you be screened at an earlier age.  If you have a family history of colorectal cancer, talk with your health care provider about genetic  screening.  Your health care provider may also recommend using home test kits to check for hidden blood in your stool.  A small camera at the end of a tube can be used to examine your colon directly (sigmoidoscopy or colonoscopy). This is done to check for the earliest forms of colorectal cancer.  Direct examination of the colon should be repeated every 5-10 years until age 9. However, if early forms of precancerous polyps or small growths are found or if you have a family history or genetic risk for colorectal cancer, you may need to be screened more often.  Skin Cancer  Check your skin from head to toe regularly.  Monitor any moles. Be sure to tell your health care provider: ? About any new moles or changes in moles, especially if there is a change in a mole's shape or color. ? If you have a mole that is larger than the size of a pencil eraser.  If any of your family members has a history of skin cancer, especially at a young age, talk with your health care provider about genetic screening.  Always use sunscreen. Apply sunscreen liberally and repeatedly throughout the day.  Whenever you are outside, protect yourself by wearing long sleeves, pants, a wide-brimmed hat, and sunglasses.  What should I know about osteoporosis? Osteoporosis is a condition in which bone destruction happens more quickly than new bone creation. After menopause, you may be at an increased risk for osteoporosis. To help prevent osteoporosis or the bone fractures that can happen because of osteoporosis, the following is recommended:  If you are 33-8 years old, get at least 1,000 mg of calcium and at least 600 mg of vitamin D per day.  If you are older than age 31 but younger than age 45, get at least 1,200 mg of calcium and at least 600 mg of vitamin D per day.  If you are older than age 72, get at least 1,200 mg of calcium and at least 800 mg of vitamin D per day.  Smoking and excessive alcohol intake  increase the risk of osteoporosis. Eat foods that are rich in calcium and vitamin D, and do weight-bearing exercises several times each week as directed by your health care provider. What should I know about how menopause affects my mental health? Depression may occur at any age, but it is more common as you become older. Common symptoms of depression include:  Low or sad mood.  Changes in sleep patterns.  Changes in appetite or eating patterns.  Feeling an overall lack of motivation or enjoyment of activities that you previously enjoyed.  Frequent crying spells.  Talk with your health care provider if you think that you are experiencing depression. What should I know about immunizations? It is important  that you get and maintain your immunizations. These include:  Tetanus, diphtheria, and pertussis (Tdap) booster vaccine.  Influenza every year before the flu season begins.  Pneumonia vaccine.  Shingles vaccine.  Your health care provider may also recommend other immunizations. This information is not intended to replace advice given to you by your health care provider. Make sure you discuss any questions you have with your health care provider. Document Released: 04/05/2005 Document Revised: 09/01/2015 Document Reviewed: 11/15/2014 [employer] Interactive Patient Education  2018 [employer]  

## 2017-06-06 ENCOUNTER — Encounter: Payer: Self-pay | Admitting: Family Medicine

## 2017-10-07 ENCOUNTER — Other Ambulatory Visit: Payer: Self-pay | Admitting: Family Medicine

## 2017-10-07 DIAGNOSIS — F428 Other obsessive-compulsive disorder: Secondary | ICD-10-CM

## 2017-10-21 ENCOUNTER — Other Ambulatory Visit: Payer: Self-pay | Admitting: Family Medicine

## 2017-10-21 DIAGNOSIS — E039 Hypothyroidism, unspecified: Secondary | ICD-10-CM

## 2017-10-22 ENCOUNTER — Encounter: Payer: Self-pay | Admitting: Family Medicine

## 2017-10-22 NOTE — Progress Notes (Signed)
[facility] [address], [place], [place] [address] [phone]  Date:  10/23/2017   Name:  Kimberly Kent   DOB:  08/07/1955   MRN:  [id number]  PCP:  Darreld Mclean, MD    Chief Complaint: Chest Pain (headache with aura, trouble talking, shortness of breath, chest discomfort, tingling in left arm)   History of Present Illness:  Kimberly Kent is a 62 y.o. very pleasant female patient who presents with the following:  History of migraine, hypothyroidism I had received a concerning email from pt yesterday as follows and scheduled a follow-up appt today: Hi, I had a medical situation last week and feel I need to see a Cardiologist. Some slight chest pain followed with the aura symptoms of a migraine along with problem speaking. This happened 2 days in a row. I've had some shortness of breath, very tired and feeling that I have tourniquets on my arms at times. My last visit with Dr. Olevia Perches was 3 yrs ago so they are requiring a referral. I would like to see Dr. Burt Knack or Dr. Johnsie Cancel. Or i guess 1st available. The last test Dr. Olevia Perches did showed a small hole in my heart so thinking this needs to be checked. Please advise.   Lab Results  Component Value Date   TSH 1.66 06/05/2017   Pt notes that 8 days ago she noted onset of aura like she might get with her migraine "squiggly lines in my vision and tunnel vision"   This went away eventually, she had a mild headache only and not the migraine she was expecting  The next day she went to the bank and again had the aura, she was trying to speak to a teller and felt like she was not making sense- speech not slurred but she kept mixing her words up She went to her car and made sure that her did not have any facial asymmetry or arm weakness.  Eventually the aura seemed to go away  Then over the last 5 days or so she has noted a mild chest pain/ pressure and some SOB esp with  exertion She also notes that her left arm has been tingly/ numb since last night  She has history of PFO- see echo from 2010  1. Left ventricle: The cavity size was normal. Wall thickness was  normal. Systolic function was normal. The estimated ejection  fraction was in the range of 55% to 60%. Wall motion was normal;  there were no regional wall motion abnormalities. 2. Left atrium: The atrium was mildly dilated. 3. Atrial septum: Agitated saline contrast study showed a  right-to-left atrial level shunt, in the baseline state. 4. Pulmonary arteries: Systolic pressure was mildly increased. Echocardiography. M-mode, complete 2D, spectral Doppler, and color Doppler. Height: Height: 170.2cm. Height: 67in. Weight: Weight: 78.9kg. Weight: 173.6lb. Body mass index: BMI: 27.3kg/m^2. Body surface area: BSA: 1.59m^2. Patient status: Outpatient.  She does see Dr. Jannifer Franklin for neurology care-   Patient Active Problem List   Diagnosis Date Noted  . Vitamin D deficiency 03/06/2014  . Migraine with aura 05/20/2011  . Hypothyroid 05/20/2011  . SHORTNESS OF BREATH 12/22/2009  . TRANSIENT VISUAL LOSS 07/13/2008    Past Medical History:  Diagnosis Date  . Anemia   . Anxiety   . Arthritis   . Blood transfusion without reported diagnosis   . Depression   . Fibromyalgia 12/29/2013  . Hyperplastic colon  polyp   . Migraines   . Neuromuscular disorder ([place])   . Patent foramen ovale   . Thyroid disease   . Vitamin D deficiency     Past Surgical History:  Procedure Laterality Date  . ABDOMINAL HYSTERECTOMY    . KNEE ARTHROSCOPY     right knee/arthroscopic  . TONSILLECTOMY      Social History   Tobacco Use  . Smoking status: Never Smoker  . Smokeless tobacco: Never Used  Substance Use Topics  . Alcohol use: No  . Drug use: No    Family History  Problem Relation Age of Onset  . Cancer Mother   . Cancer Father   . Diabetes Father   . Parkinson's disease  Maternal Grandmother   . Cancer Maternal Grandfather     Allergies  Allergen Reactions  . Bupropion Hcl     REACTION: hives  . Codeine     REACTION: nausea and vomiting    Medication list has been reviewed and updated.  Current Outpatient Medications on File Prior to Visit  Medication Sig Dispense Refill  . cholecalciferol (VITAMIN D) 1000 UNITS tablet Take 1,000 Units by mouth daily.    Marland Kitchen FLUoxetine (PROZAC) 10 MG capsule TAKE 1 CAPSULE BY MOUTH EVERY DAY 90 capsule 1  . Glucosamine-Chondroitin 1500-1200 MG/30ML LIQD Take by mouth.    . SYNTHROID 75 MCG tablet Take 1 tablet (75 mcg total) by mouth daily before breakfast. 90 tablet 0  . SYNTHROID 75 MCG tablet TAKE 1 TABLET (75 MCG TOTAL) BY MOUTH DAILY BEFORE BREAKFAST. 90 tablet 1  . magnesium 30 MG tablet Take 30 mg by mouth 2 (two) times daily.     No current facility-administered medications on file prior to visit.     Review of Systems:  As per HPI- otherwise negative.   Physical Examination: Vitals:   10/23/17 1056  BP: 138/82  Pulse: 65  Resp: 16  Temp: 98.4 F (36.9 C)  SpO2: 98%   Vitals:   10/23/17 1056  Weight: 188 lb (85.3 kg)  Height: 5' 6.5" (1.689 m)   Body mass index is 29.89 kg/m. Ideal Body Weight: Weight in (lb) to have BMI = 25: 156.9  GEN: WDWN, NAD, Non-toxic, A & O x 3, overweight, looks well  HEENT: Atraumatic, Normocephalic. Neck supple. No masses, No LAD. Bilateral TM wnl, oropharynx normal.  PEERL,EOMI.   Ears and Nose: No external deformity. CV: RRR, No M/G/R. No JVD. No thrill. No extra heart sounds. PULM: CTA B, no wheezes, crackles, rhonchi. No retractions. No resp. distress. No accessory muscle use. ABD: S, NT, ND EXTR: No c/c/e NEURO Normal gait.  Normal strength and sensation of all limbs, negative romberg  PSYCH: Normally interactive. Conversant. Not depressed or anxious appearing.  Calm demeanor.   EKG: normal Assessment and Plan: Chest pain, unspecified type - Plan:  EKG 12-Lead  Altered mental status, unspecified altered mental status type  PFO (patent foramen ovale)  Here today with concerning episode last week with visual aura and difficulty speaking. Now she has developed a numb feeling in her left arm over the last 24 hours and also notes chest pressure/ sob She needs further eval per ED- pt will be seen now appreciate ER care of this nice patient   Signed Lamar Blinks, MD

## 2017-10-22 NOTE — Telephone Encounter (Signed)
Called pt as I was alarmed by her sx.  She reports that she had "mild Cp" last week She also had an episode where she could not speak normally Sx are now resolved but she needs to be seen Got her on my schedule tomorrow

## 2017-10-23 ENCOUNTER — Encounter: Payer: Self-pay | Admitting: Family Medicine

## 2017-10-23 ENCOUNTER — Emergency Department (HOSPITAL_BASED_OUTPATIENT_CLINIC_OR_DEPARTMENT_OTHER)
Admission: EM | Admit: 2017-10-23 | Discharge: 2017-10-23 | Disposition: A | Discharge status: Home / Self Care | Payer: Managed Care, Other (non HMO) | Attending: Emergency Medicine | Admitting: Emergency Medicine

## 2017-10-23 ENCOUNTER — Other Ambulatory Visit: Payer: Self-pay

## 2017-10-23 ENCOUNTER — Encounter (HOSPITAL_BASED_OUTPATIENT_CLINIC_OR_DEPARTMENT_OTHER): Payer: Self-pay | Admitting: Adult Health

## 2017-10-23 ENCOUNTER — Ambulatory Visit: Payer: Managed Care, Other (non HMO) | Admitting: Family Medicine

## 2017-10-23 ENCOUNTER — Emergency Department (HOSPITAL_BASED_OUTPATIENT_CLINIC_OR_DEPARTMENT_OTHER): Payer: Managed Care, Other (non HMO)

## 2017-10-23 VITALS — BP 138/82 | HR 65 | Temp 98.4°F | Resp 16 | Ht 66.5 in | Wt 188.0 lb

## 2017-10-23 DIAGNOSIS — Q211 Atrial septal defect: Secondary | ICD-10-CM | POA: Diagnosis not present

## 2017-10-23 DIAGNOSIS — F329 Major depressive disorder, single episode, unspecified: Secondary | ICD-10-CM | POA: Diagnosis not present

## 2017-10-23 DIAGNOSIS — G43909 Migraine, unspecified, not intractable, without status migrainosus: Secondary | ICD-10-CM | POA: Diagnosis not present

## 2017-10-23 DIAGNOSIS — E039 Hypothyroidism, unspecified: Secondary | ICD-10-CM | POA: Diagnosis not present

## 2017-10-23 DIAGNOSIS — Z79899 Other long term (current) drug therapy: Secondary | ICD-10-CM | POA: Diagnosis not present

## 2017-10-23 DIAGNOSIS — G43109 Migraine with aura, not intractable, without status migrainosus: Secondary | ICD-10-CM

## 2017-10-23 DIAGNOSIS — R4182 Altered mental status, unspecified: Secondary | ICD-10-CM | POA: Diagnosis not present

## 2017-10-23 DIAGNOSIS — R079 Chest pain, unspecified: Secondary | ICD-10-CM

## 2017-10-23 DIAGNOSIS — Q2112 Patent foramen ovale: Secondary | ICD-10-CM

## 2017-10-23 DIAGNOSIS — F419 Anxiety disorder, unspecified: Secondary | ICD-10-CM | POA: Diagnosis not present

## 2017-10-23 LAB — CBC WITH DIFFERENTIAL/PLATELET
BASOS ABS: 0 10*3/uL (ref 0.0–0.1)
Basophils Relative: 1 %
EOS PCT: 2 %
Eosinophils Absolute: 0.1 10*3/uL (ref 0.0–0.7)
HEMATOCRIT: 38.6 % (ref 36.0–46.0)
Hemoglobin: 13 g/dL (ref 12.0–15.0)
LYMPHS PCT: 29 %
Lymphs Abs: 1.5 10*3/uL (ref 0.7–4.0)
MCH: 31.6 pg (ref 26.0–34.0)
MCHC: 33.7 g/dL (ref 30.0–36.0)
MCV: 93.7 fL (ref 78.0–100.0)
MONO ABS: 0.4 10*3/uL (ref 0.1–1.0)
MONOS PCT: 8 %
Neutro Abs: 3.1 10*3/uL (ref 1.7–7.7)
Neutrophils Relative %: 60 %
PLATELETS: 253 10*3/uL (ref 150–400)
RBC: 4.12 MIL/uL (ref 3.87–5.11)
RDW: 11.5 % (ref 11.5–15.5)
WBC: 5.1 10*3/uL (ref 4.0–10.5)

## 2017-10-23 LAB — BASIC METABOLIC PANEL
Anion gap: 8 (ref 5–15)
BUN: 15 mg/dL (ref 6–20)
CALCIUM: 9.2 mg/dL (ref 8.9–10.3)
CHLORIDE: 104 mmol/L (ref 98–111)
CO2: 28 mmol/L (ref 22–32)
CREATININE: 0.6 mg/dL (ref 0.44–1.00)
GFR calc Af Amer: >60 mL/min (ref >60)
GFR calc non Af Amer: >60 mL/min (ref >60)
GLUCOSE: 86 mg/dL (ref 70–99)
Potassium: 3.9 mmol/L (ref 3.5–5.1)
Sodium: 140 mmol/L (ref 135–145)

## 2017-10-23 LAB — TROPONIN I: Troponin I: <0.03 ng/mL (ref <0.03)

## 2017-10-23 NOTE — ED Provider Notes (Signed)
[facility] EMERGENCY DEPARTMENT Provider Note   CSN: [id number] Arrival date & time: 10/23/17  1133     History   Chief Complaint Chief Complaint  Patient presents with  . Chest Pain    HPI Kimberly Kent is a 62 y.o. female.  HPI Patient presents from PCPs office.  Presents with chest pain headache and had some difficulty speaking.  Around a week ago she was at the bank and ended up having some difficulty speaking.  Has a history of migraines all shift though she states normally she gets Tylenol as able to stop the headache from getting as severe as it has been in the past.  States that the day of the difficulty speaking she had had the typical's wavering in her vision that went from the side to the middle and then narrow down her vision.  States she never get a severe headache but did have difficulty speaking.  States that symptoms had resolved and not come back.  She is worried somewhat because she has had patent foramen ovale and is worried she could have had a stroke. She is also had a couple days of some chest discomfort.  It is dull in her left chest.  States she is been playing more pickleball and thinks this could have caused it.  She has had some general shortness of breath over the last couple months but not chest pain with exertion.  States she does have some tingling in her left forearm from her elbow to her wrist today.  No weakness.  Does not have the changes in her vision right now.  Sent down to the ER for further evaluation and treatment. Past Medical History:  Diagnosis Date  . Anemia   . Anxiety   . Arthritis   . Blood transfusion without reported diagnosis   . Depression   . Fibromyalgia 12/29/2013  . Hyperplastic colon polyp   . Migraines   . Neuromuscular disorder ([place])   . Patent foramen ovale   . Thyroid disease   . Vitamin D deficiency     Patient Active Problem List   Diagnosis Date Noted  . Vitamin D deficiency 03/06/2014  . Migraine with  aura 05/20/2011  . Hypothyroid 05/20/2011  . SHORTNESS OF BREATH 12/22/2009  . TRANSIENT VISUAL LOSS 07/13/2008    Past Surgical History:  Procedure Laterality Date  . ABDOMINAL HYSTERECTOMY    . KNEE ARTHROSCOPY     right knee/arthroscopic  . TONSILLECTOMY       OB History   None      Home Medications    Prior to Admission medications   Medication Sig Start Date End Date Taking? Authorizing Provider  cholecalciferol (VITAMIN D) 1000 UNITS tablet Take 1,000 Units by mouth daily.    [provider]  FLUoxetine (PROZAC) 10 MG capsule TAKE 1 CAPSULE BY MOUTH EVERY DAY 10/07/17   Copland, Gay Filler, MD  Glucosamine-Chondroitin 1500-1200 MG/30ML LIQD Take by mouth.    [provider]  magnesium 30 MG tablet Take 30 mg by mouth 2 (two) times daily.    [provider]  SYNTHROID 75 MCG tablet Take 1 tablet (75 mcg total) by mouth daily before breakfast. 05/27/17   Copland, Gay Filler, MD  SYNTHROID 75 MCG tablet TAKE 1 TABLET (75 MCG TOTAL) BY MOUTH DAILY BEFORE BREAKFAST. 10/22/17   Copland, Gay Filler, MD    Family History Family History  Problem Relation Age of Onset  . Cancer Mother   .  Cancer Father   . Diabetes Father   . Parkinson's disease Maternal Grandmother   . Cancer Maternal Grandfather     Social History Social History   Tobacco Use  . Smoking status: Never Smoker  . Smokeless tobacco: Never Used  Substance Use Topics  . Alcohol use: No  . Drug use: No     Allergies   Bupropion hcl and Codeine   Review of Systems Review of Systems  Constitutional: Negative for appetite change.  HENT: Negative for congestion.   Eyes: Positive for visual disturbance.  Respiratory: Positive for shortness of breath.   Cardiovascular: Positive for chest pain.  Gastrointestinal: Negative for abdominal pain.  Genitourinary: Negative for flank pain.  Neurological: Positive for speech difficulty. Negative for numbness.  Psychiatric/Behavioral:  Negative for confusion.     Physical Exam Updated Vital Signs BP (!) 154/79 (BP Location: Right Arm)   Pulse 62   Temp 98.1 F (36.7 C) (Oral)   Resp 18   Ht 5\' 6"  (1.676 m)   Wt 84.4 kg   SpO2 100%   BMI 30.02 kg/m   Physical Exam  Constitutional: She is oriented to person, place, and time. She appears well-developed.  HENT:  Head: Atraumatic.  Eyes: EOM are normal.  Neck: Neck supple.  Cardiovascular: Normal rate.  Abdominal: Soft.  Musculoskeletal:       Right lower leg: She exhibits no edema.       Left lower leg: She exhibits no edema.  Neurological: She is alert and oriented to person, place, and time. No cranial nerve deficit.  Clear speech.  Normal extraocular movements.  Normal ambulation.  Good grip strength bilaterally.  Sensation intact over bilateral upper extremities.  Normal neurologic examination per  Skin: Skin is warm. Capillary refill takes less than 2 seconds.     ED Treatments / Results  Labs (all labs ordered are listed, but only abnormal results are displayed) Labs Reviewed  CBC WITH DIFFERENTIAL/PLATELET  TROPONIN I  BASIC METABOLIC PANEL    EKG EKG Interpretation  Date/Time:  Thursday October 23 2017 11:58:42 EDT Ventricular Rate:  61 PR Interval:    QRS Duration: 99 QT Interval:  438 QTC Calculation: 442 R Axis:   -4 Text Interpretation:  Sinus rhythm RSR' in V1 or V2, right VCD or RVH Confirmed by Davonna Belling [phone]) on 10/23/2017 1:18:30 PM   Radiology Dg Chest 2 View  Result Date: 10/23/2017 CLINICAL DATA:  Chest pain, shortness of breath. EXAM: CHEST - 2 VIEW COMPARISON:  Radiographs of June 05, 2017. FINDINGS: The heart size and mediastinal contours are within normal limits. Both lungs are clear. No pneumothorax or pleural effusion is noted. The visualized skeletal structures are unremarkable. IMPRESSION: No active cardiopulmonary disease. Electronically Signed   By: Marijo Conception, M.D.   On: 10/23/2017 13:32   Ct Head  Wo Contrast  Result Date: 10/23/2017 CLINICAL DATA:  Episode of headache and difficulty speaking a week ago. EXAM: CT HEAD WITHOUT CONTRAST TECHNIQUE: Contiguous axial images were obtained from the base of the skull through the vertex without intravenous contrast. COMPARISON:  MRI brain dated August 09, 2009. FINDINGS: Brain: No evidence of acute infarction, hemorrhage, hydrocephalus, extra-axial collection or mass lesion/mass effect. Vascular: No hyperdense vessel or unexpected calcification. Skull: Normal. Negative for fracture or focal lesion. Sinuses/Orbits: No acute finding. Other: None. IMPRESSION: 1. Normal noncontrast head CT. Electronically Signed   By: Titus Dubin M.D.   On: 10/23/2017 13:19    Procedures Procedures (  including critical care time)  Medications Ordered in ED Medications - No data to display   Initial Impression / Assessment and Plan / ED Course  I have reviewed the triage vital signs and the nursing notes.  Pertinent labs & imaging results that were available during my care of the patient were reviewed by me and considered in my medical decision making (see chart for details).     Patient with headache and difficulty speaking last week.  May be complicated migraine.  History of migraines but have not been complicated in the past.  Paresthesias in the left arm think are likely not from a central cause.  Chest x-ray reassuring with negative EKG.  Head CT done and reassuring for the speech and vision changes.  Discussed with patient and Dr. Edilia Bo, who is her primary care doctor.  Will work-up further as an outpatient with neurology and MRI.  Discharge home.  Final Clinical Impressions(s) / ED Diagnoses   Final diagnoses:  Nonspecific chest pain  Complicated migraine    ED Discharge Orders    None       Davonna Belling, MD 10/23/17 1555

## 2017-10-23 NOTE — ED Triage Notes (Signed)
Presents from Dr. Floydene Flock office, She had an episode last week where she was unable to get her words out and then developed a headache. SHe denies headache or any similar symptoms now. She went to see Dr. Lear Ng today due to some chest discomfort and left arm tingling. History of "hole in my heart" Dr. Edilia Bo concerned pt is have TIAs or CVA due to heart issues and requesting MRI.

## 2017-10-29 ENCOUNTER — Encounter: Payer: Self-pay | Admitting: Family Medicine

## 2017-10-29 DIAGNOSIS — Q211 Atrial septal defect: Secondary | ICD-10-CM

## 2017-10-29 DIAGNOSIS — R479 Unspecified speech disturbances: Secondary | ICD-10-CM

## 2017-10-29 DIAGNOSIS — Q2112 Patent foramen ovale: Secondary | ICD-10-CM

## 2017-11-06 NOTE — Addendum Note (Signed)
Addended by: Lamar Blinks C on: 11/06/2017 12:55 PM   Modules accepted: Orders

## 2017-11-17 ENCOUNTER — Ambulatory Visit
Admission: RE | Admit: 2017-11-17 | Discharge: 2017-11-17 | Disposition: A | Discharge status: Home / Self Care | Payer: Managed Care, Other (non HMO) | Source: Ambulatory Visit | Attending: Family Medicine | Admitting: Family Medicine

## 2017-11-17 DIAGNOSIS — R479 Unspecified speech disturbances: Secondary | ICD-10-CM

## 2017-11-18 ENCOUNTER — Encounter: Payer: Self-pay | Admitting: Family Medicine

## 2018-01-07 ENCOUNTER — Encounter: Payer: Self-pay | Admitting: Family Medicine

## 2018-01-13 ENCOUNTER — Ambulatory Visit: Payer: Managed Care, Other (non HMO) | Admitting: Cardiology

## 2018-01-13 ENCOUNTER — Encounter: Payer: Self-pay | Admitting: Cardiology

## 2018-01-13 VITALS — BP 130/66 | HR 67 | Ht 66.0 in | Wt 186.2 lb

## 2018-01-13 DIAGNOSIS — Z01812 Encounter for preprocedural laboratory examination: Secondary | ICD-10-CM | POA: Diagnosis not present

## 2018-01-13 DIAGNOSIS — Q211 Atrial septal defect: Secondary | ICD-10-CM

## 2018-01-13 DIAGNOSIS — R479 Unspecified speech disturbances: Secondary | ICD-10-CM

## 2018-01-13 DIAGNOSIS — R079 Chest pain, unspecified: Secondary | ICD-10-CM

## 2018-01-13 DIAGNOSIS — Z7189 Other specified counseling: Secondary | ICD-10-CM

## 2018-01-13 DIAGNOSIS — Q2112 Patent foramen ovale: Secondary | ICD-10-CM

## 2018-01-13 NOTE — Patient Instructions (Addendum)
Medication Instructions:  Your Physician recommend you continue on your current medication as directed.    If you need a refill on your cardiac medications before your next appointment, please call your pharmacy.   Lab work: Your physician recommends that you return for lab work 1 week prior to test  If you have labs (blood work) drawn today and your tests are completely normal, you will receive your results only by: Marland Kitchen MyChart Message (if you have MyChart) OR . A paper copy in the mail If you have any lab test that is abnormal or we need to change your treatment, we will call you to review the results.  Testing/Procedures: Your physician has requested that you have cardiac CT. Cardiac computed tomography (CT) is a painless test that uses an x-ray machine to take clear, detailed pictures of your heart. For further information please visit HugeFiesta.tn. Please follow instruction sheet as given. Kimberly Kent    Follow-Up: At [facility], you and your health needs are our priority.  As part of our continuing mission to provide you with exceptional heart care, we have created designated Provider Care Teams.  These Care Teams include your primary Cardiologist (physician) and Advanced Practice Providers (APPs -  Physician Assistants and Nurse Practitioners) who all work together to provide you with the care you need, when you need it. You will need a follow up appointment in 1 years.  Please call our office 2 months in advance to schedule this appointment.  You may see Dr. Harrell Gave or one of the following Advanced Practice Providers on your designated Care Team:   Rosaria Ferries, PA-C . Jory Sims, DNP, ANP  Any Other Special Instructions Will Be Listed Below (If Applicable).  Please arrive at the [facility] main entrance of [facility] at xx:xx AM (30-45 minutes prior to test start time)  [facility] [place], [place] [address] [phone]  Proceed to the [facility] Radiology Department (First Floor).  Please follow these instructions carefully (unless otherwise directed):  Hold all erectile dysfunction medications at least 48 hours prior to test.  On the Night Before the Test: . Be sure to Drink plenty of water. . Do not consume any caffeinated/decaffeinated beverages or chocolate 12 hours prior to your test. . Do not take any antihistamines 12 hours prior to your test.   On the Day of the Test: . Drink plenty of water. Do not drink any water within one hour of the test. . Do not eat any food 4 hours prior to the test. . You may take your regular medications prior to the test.        After the Test: . Drink plenty of water. . After receiving IV contrast, you may experience a mild flushed feeling. This is normal. . On occasion, you may experience a mild rash up to 24 hours after the test. This is not dangerous. If this occurs, you can take Benadryl 25 mg and increase your fluid intake. . If you experience trouble breathing, this can be serious. If it is severe call 911 IMMEDIATELY. If it is mild, please call our office.   Mediterranean Diet A Mediterranean diet refers to food and lifestyle choices that are based on the traditions of countries located on the [employer]. This way of eating has been shown to help prevent certain conditions and improve outcomes for people who have chronic diseases, like kidney disease and heart disease. What are tips for following  this plan? Lifestyle  Cook and eat meals together with your family, when possible.  Drink enough fluid to keep your urine clear or pale yellow.  Be physically active every day. This includes: ? Aerobic exercise like running or swimming. ? Leisure activities like gardening, walking, or housework.  Get 7-8 hours of sleep each night.  If recommended by your health care provider, drink red wine in moderation. This means 1 glass a day for  nonpregnant women and 2 glasses a day for men. A glass of wine equals 5 oz (150 mL). Reading food labels  Check the serving size of packaged foods. For foods such as rice and pasta, the serving size refers to the amount of cooked product, not dry.  Check the total fat in packaged foods. Avoid foods that have saturated fat or trans fats.  Check the ingredients list for added sugars, such as corn syrup. Shopping  At the grocery store, buy most of your food from the areas near the walls of the store. This includes: ? Fresh fruits and vegetables (produce). ? Grains, beans, nuts, and seeds. Some of these may be available in unpackaged forms or large amounts (in bulk). ? Fresh seafood. ? Poultry and eggs. ? Low-fat dairy products.  Buy whole ingredients instead of prepackaged foods.  Buy fresh fruits and vegetables in-season from local farmers markets.  Buy frozen fruits and vegetables in resealable bags.  If you do not have access to quality fresh seafood, buy precooked frozen shrimp or canned fish, such as tuna, salmon, or sardines.  Buy small amounts of raw or cooked vegetables, salads, or olives from the deli or salad bar at your store.  Stock your pantry so you always have certain foods on hand, such as olive oil, canned tuna, canned tomatoes, rice, pasta, and beans. Cooking  Cook foods with extra-virgin olive oil instead of using butter or other vegetable oils.  Have meat as a side dish, and have vegetables or grains as your main dish. This means having meat in small portions or adding small amounts of meat to foods like pasta or stew.  Use beans or vegetables instead of meat in common dishes like chili or lasagna.  Experiment with different cooking methods. Try roasting or broiling vegetables instead of steaming or sauteing them.  Add frozen vegetables to soups, stews, pasta, or rice.  Add nuts or seeds for added healthy fat at each meal. You can add these to yogurt, salads,  or vegetable dishes.  Marinate fish or vegetables using olive oil, lemon juice, garlic, and fresh herbs. Meal planning  Plan to eat 1 vegetarian meal one day each week. Try to work up to 2 vegetarian meals, if possible.  Eat seafood 2 or more times a week.  Have healthy snacks readily available, such as: ? Vegetable sticks with hummus. ? [place] yogurt. ? Fruit and nut trail mix.  Eat balanced meals throughout the week. This includes: ? Fruit: 2-3 servings a day ? Vegetables: 4-5 servings a day ? Low-fat dairy: 2 servings a day ? Fish, poultry, or lean meat: 1 serving a day ? Beans and legumes: 2 or more servings a week ? Nuts and seeds: 1-2 servings a day ? Whole grains: 6-8 servings a day ? Extra-virgin olive oil: 3-4 servings a day  Limit red meat and sweets to only a few servings a month What are my food choices?  Mediterranean diet ? Recommended ? Grains: Whole-grain pasta. Brown rice. Bulgar wheat. Polenta. Couscous. Whole-wheat  bread. [place]. ? Vegetables: Artichokes. Beets. Broccoli. Cabbage. Carrots. Eggplant. Green beans. Chard. Kale. Spinach. Onions. Leeks. Peas. Squash. Tomatoes. Peppers. Radishes. ? Fruits: Apples. Apricots. Avocado. Berries. Bananas. Cherries. Dates. Figs. Grapes. Lemons. Melon. Oranges. Peaches. Plums. Pomegranate. ? Meats and other protein foods: Beans. Almonds. Sunflower seeds. Pine nuts. Peanuts. [place]. Salmon. Scallops. Shrimp. [place]. Tilapia. Clams. Oysters. Eggs. ? Dairy: Low-fat milk. Cheese. Greek yogurt. ? Beverages: Water. Red wine. Herbal tea. ? Fats and oils: Extra virgin olive oil. Avocado oil. Grape seed oil. ? Sweets and desserts: [place] yogurt with honey. Baked apples. Poached pears. Trail mix. ? Seasoning and other foods: Basil. Cilantro. Coriander. Cumin. Mint. Parsley. Sage. Rosemary. Tarragon. Garlic. Oregano. Thyme. Pepper. Balsalmic vinegar. Tahini. Hummus. Tomato sauce. Olives. Mushrooms. ? Limit these ? Grains:  Prepackaged pasta or rice dishes. Prepackaged cereal with added sugar. ? Vegetables: Deep fried potatoes (french fries). ? Fruits: Fruit canned in syrup. ? Meats and other protein foods: Beef. Pork. Lamb. Poultry with skin. Hot dogs. Berniece Salines. ? Dairy: Ice cream. Sour cream. Whole milk. ? Beverages: Juice. Sugar-sweetened soft drinks. Beer. Liquor and spirits. ? Fats and oils: Butter. Canola oil. Vegetable oil. Beef fat (tallow). Lard. ? Sweets and desserts: Cookies. Cakes. Pies. Candy. ? Seasoning and other foods: Mayonnaise. Premade sauces and marinades. ? The items listed may not be a complete list. Talk with your dietitian about what dietary choices are right for you. Summary  The Mediterranean diet includes both food and lifestyle choices.  Eat a variety of fresh fruits and vegetables, beans, nuts, seeds, and whole grains.  Limit the amount of red meat and sweets that you eat.  Talk with your health care provider about whether it is safe for you to drink red wine in moderation. This means 1 glass a day for nonpregnant women and 2 glasses a day for men. A glass of wine equals 5 oz (150 mL). This information is not intended to replace advice given to you by your health care provider. Make sure you discuss any questions you have with your health care provider. Document Released: 10/05/2015 Document Revised: 11/07/2015 Document Reviewed: 10/05/2015 Elsevier Interactive Patient Education  [employer].

## 2018-01-13 NOTE — Progress Notes (Signed)
Cardiology Office Note:    Date:  01/13/2018   ID:  Kimberly Kent, DOB May 28, 1955, MRN [id number]  PCP:  Darreld Mclean, MD  Cardiologist:  Buford Dresser, MD PhD (previously seen by Dr. Ross/Dr. Olevia Perches in 2010-11)  Referring MD: Darreld Mclean, MD   CC: speech difficulty, chest discomfort  History of Present Illness:    Kimberly Kent is a 62 y.o. female with a hx of migraine with aura, hypothryoidism who is seen as a new consult at the request of Copland, Gay Filler, MD for the evaluation and management of PFO and chest pain. She was recently seen in the ER 10/23/17 for chest pain, headache, and difficulty with speech. MRI (done later) and CT head unremarkable.   Patient concerns today: had an episode when she was having word finding difficulty; was having an aura similar to her migraines. She drove herself home, but when she followed up with Dr. Lorelei Pont she was recommended to present to ER for TIA evaluation. We discussed at length today the indications for PFO closure, reviewed in the plan. She does not meet criteria for this. Reviewed cause and anatomy of PFO.   In the last 4-5 mos, has also felt more aware of her breathing; also noted more dscomfort of her chest and occasional tingling down the back of her arm. Occur when she is sitting; no discomfort in chest with being active, but does feel short of breath with exertion. Discomfort is dull, doesn't radiate, not associated with nausea/vomiting/diaphoresis. Feels like a little electrical shock.  No syncope, PND, orthopnea. Has LE edema after she has been on her feet all day, better with compression stockings and elevation. Working on weight loss; had lost 35 lbs and then gained some back.  Past Medical History:  Diagnosis Date  . Anemia   . Anxiety   . Arthritis   . Blood transfusion without reported diagnosis   . Depression   . Fibromyalgia 12/29/2013  . Hyperplastic colon polyp   . Migraines   . Neuromuscular  disorder ([place])   . Patent foramen ovale   . Thyroid disease   . Vitamin D deficiency     Past Surgical History:  Procedure Laterality Date  . ABDOMINAL HYSTERECTOMY    . KNEE ARTHROSCOPY     right knee/arthroscopic  . TONSILLECTOMY      Current Medications: Current Outpatient Medications on File Prior to Visit  Medication Sig  . cholecalciferol (VITAMIN D) 1000 UNITS tablet Take 1,000 Units by mouth daily.  Marland Kitchen FLUoxetine (PROZAC) 10 MG capsule TAKE 1 CAPSULE BY MOUTH EVERY DAY  . Glucosamine-Chondroitin 1500-1200 MG/30ML LIQD Take by mouth.  . magnesium 30 MG tablet Take 30 mg by mouth 2 (two) times daily.  Marland Kitchen SYNTHROID 75 MCG tablet TAKE 1 TABLET (75 MCG TOTAL) BY MOUTH DAILY BEFORE BREAKFAST.   No current facility-administered medications on file prior to visit.      Allergies:   Bupropion hcl and Codeine   Social History   Socioeconomic History  . Marital status: Single    Spouse name: Not on file  . Number of children: Not on file  . Years of education: Not on file  . Highest education level: Not on file  Occupational History  . Occupation: mortgage industry [employer]  Social Needs  . Financial resource strain: Not on file  . Food insecurity:    Worry: Not on file    Inability: Not on file  . Transportation needs:  Medical: Not on file    Non-medical: Not on file  Tobacco Use  . Smoking status: Never Smoker  . Smokeless tobacco: Never Used  Substance and Sexual Activity  . Alcohol use: No  . Drug use: No  . Sexual activity: Not on file  Lifestyle  . Physical activity:    Days per week: Not on file    Minutes per session: Not on file  . Stress: Not on file  Relationships  . Social connections:    Talks on phone: Not on file    Gets together: Not on file    Attends religious service: Not on file    Active member of club or organization: Not on file    Attends meetings of clubs or organizations: Not on file    Relationship status: Not on file    Other Topics Concern  . Not on file  Social History Narrative  . Not on file     Family History: The patient's family history includes Cancer in her father, maternal grandfather, and mother; Diabetes in her father; Parkinson's disease in her maternal grandmother.  ROS:   Please see the history of present illness.  Additional pertinent ROS:  Constitutional: Negative for chills, fever, night sweats, unintentional weight loss  HENT: Negative for ear pain and hearing loss.   Eyes: Negative for loss of vision and eye pain.  Respiratory: Positive for dyspnea on exertion. Negative for cough, sputum, wheezing.   Cardiovascular: Positive for chest discomfort, LE edema at the end of the day. Negative for palpitations, PND, orthopnea, and claudication.  Gastrointestinal: Negative for abdominal pain, melena, and hematochezia.  Genitourinary: Negative for dysuria and hematuria.  Musculoskeletal: Negative for falls and myalgias.  Skin: Negative for itching and rash.  Neurological: Negative for focal weakness, focal sensory changes and loss of consciousness.  Endo/Heme/Allergies: Does not bruise/bleed easily.    EKGs/Labs/Other Studies Reviewed:    The following studies were reviewed today: Echo 01/09/10 Study Conclusions Left ventricle: The cavity size was normal. Systolic function was normal. The estimated ejection fraction was 55%. Wall motion was normal; there were no regional wall motion abnormalities.  Stress echo 01/09/10 Study Conclusions - Stress ECG conclusions: There were no stress arrhythmias or conduction abnormalities. The stress ECG was negative for ischemia. - Staged echo: There was no echocardiographic evidence for stress-induced ischemia.  Stress results: Maximal heart rate during stress was 153bpm (90% of maximal predicted heart rate). The maximal predicted heart rate was 153bpm. The target heart rate was achieved. The heart rate response to stress was normal. There  was a normal resting blood pressure with an appropriate response to stress. The rate-pressure product for the peak heart rate and blood pressure was 2708mm Hg/min. The patient experienced no chest pain during stress.  Echo 07/13/08 Study Conclusions 1. Left ventricle: The cavity size was normal. Wall thickness was normal. Systolic function was normal. The estimated ejection  fraction was in the range of 55% to 60%. Wall motion was normal; there were no regional wall motion abnormalities. 2. Left atrium: The atrium was mildly dilated. 3. Atrial septum: Agitated saline contrast study showed a right-to-left atrial level shunt, in the baseline state.  EKG:  EKG is personally reviewed.  The ekg ordered today demonstrates normal sinus rhythm  Recent Labs: 06/05/2017: ALT 19; TSH 1.66 10/23/2017: BUN 15; Creatinine, Ser 0.60; Hemoglobin 13.0; Platelets 253; Potassium 3.9; Sodium 140  Recent Lipid Panel    Component Value Date/Time   CHOL 208 (H) 06/05/2017  1342   TRIG 50.0 06/05/2017 1342   HDL 76.30 06/05/2017 1342   CHOLHDL 3 06/05/2017 1342   VLDL 10.0 06/05/2017 1342   LDLCALC 121 (H) 06/05/2017 1342    Physical Exam:    VS:  BP 130/66 (BP Location: Right Arm, Patient Position: Sitting, Cuff Size: Normal)   Pulse 67   Ht 5\' 6"  (1.676 m)   Wt 186 lb 3.2 oz (84.5 kg)   BMI 30.05 kg/m     Wt Readings from Last 3 Encounters:  01/13/18 186 lb 3.2 oz (84.5 kg)  10/23/17 186 lb (84.4 kg)  10/23/17 188 lb (85.3 kg)     GEN: Well nourished, well developed in no acute distress HEENT: Normal NECK: No JVD; No carotid bruits LYMPHATICS: No lymphadenopathy CARDIAC: regular rhythm, normal S1 and S2, no murmurs, rubs, gallops. Radial and DP pulses 2+ bilaterally. RESPIRATORY:  Clear to auscultation without rales, wheezing or rhonchi  ABDOMEN: Soft, non-tender, non-distended MUSCULOSKELETAL:  No edema, compression stockings in place; No deformity  SKIN: Warm and dry NEUROLOGIC:   Alert and oriented x 3 PSYCHIATRIC:  Normal affect   ASSESSMENT:    1. Chest pain, unspecified type   2. Speech disturbance, unspecified type   3. Pre-procedure lab exam   4. PFO (patent foramen ovale)   5. Counseling on health promotion and disease prevention    PLAN:    1. Chest discomfort: atypical in nature, but very bothersome to patient. We discussed her ASCVD risk (below) as well as options for evaluation. She had a normal stress echo in the past. We will pursue coronary CTA to get an anatomic evaluation, and this will also assist with risk assessment for future cardiovascular disease. -coronary CTA with pre-test BMET  2. Speech disturbance: unclear etiology to me. CT head and MRI unremarkable. Recommend referral to neurology for further evaluation. Does have a history of complex migraines.  3. History of PFO/right to left shunt on bubble study in the past: we discussed the anatomy of PFO, how they form, and what they are associated with. We also reviewed that there are only rare circumstances in which we discuss closure. There is no solid evidence for PFO closure in complex migraine. Even for documented embolic stroke, many of the studies for closure are equivocal. No indication for closure at this time.  4. Prevention and health counseling -recommend heart healthy/Mediterranean diet, with whole grains, fruits, vegetable, fish, lean meats, nuts, and olive oil. Limit salt. -recommend moderate walking, 3-5 times/week for 30-50 minutes each session. Aim for at least 150 minutes.week. Goal should be pace of 3 miles/hours, or walking 1.5 miles in 30 minutes -recommend avoidance of tobacco products. Avoid excess alcohol. -Additional risk factor control:  -Diabetes: A1c is 5.4  -Lipids: HDL 76, LDL 121, Tchol 208, TG 50. Based on ASCVD risk, no indication for medication. Will adjust based on results of CT  -Blood pressure control: within goal range   -Weight: BMI 30. Working on weight  loss -ASCVD risk score: The 10-year ASCVD risk score Mikey Bussing DC Brooke Bonito., et al., 2013) is: 2.5%   Values used to calculate the score:     Age: 61 years     Sex: Female     Is Non-Hispanic African American: No     Diabetic: No     Tobacco smoker: No     Systolic Blood Pressure: 962 mmHg     Is BP treated: No     HDL Cholesterol: 76.3 mg/dL  Total Cholesterol: 208 mg/dL   Plan for follow up: 1 year or sooner based on results of testing  Medication Adjustments/Labs and Tests Ordered: Current medicines are reviewed at length with the patient today.  Concerns regarding medicines are outlined above.  Orders Placed This Encounter  Procedures  . CT CORONARY MORPH W/CTA COR W/SCORE W/CA W/CM &/OR WO/CM  . CT CORONARY FRACTIONAL FLOW RESERVE DATA PREP  . CT CORONARY FRACTIONAL FLOW RESERVE FLUID ANALYSIS  . Basic metabolic panel  . EKG 12-Lead   No orders of the defined types were placed in this encounter.   Patient Instructions  Medication Instructions:  Your Physician recommend you continue on your current medication as directed.    If you need a refill on your cardiac medications before your next appointment, please call your pharmacy.   Lab work: Your physician recommends that you return for lab work 1 week prior to test  If you have labs (blood work) drawn today and your tests are completely normal, you will receive your results only by: Marland Kitchen MyChart Message (if you have MyChart) OR . A paper copy in the mail If you have any lab test that is abnormal or we need to change your treatment, we will call you to review the results.  Testing/Procedures: Your physician has requested that you have cardiac CT. Cardiac computed tomography (CT) is a painless test that uses an x-ray machine to take clear, detailed pictures of your heart. For further information please visit HugeFiesta.tn. Please follow instruction sheet as given. [place]    Follow-Up: At [facility], you and  your health needs are our priority.  As part of our continuing mission to provide you with exceptional heart care, we have created designated Provider Care Teams.  These Care Teams include your primary Cardiologist (physician) and Advanced Practice Providers (APPs -  Physician Assistants and Nurse Practitioners) who all work together to provide you with the care you need, when you need it. You will need a follow up appointment in 1 years.  Please call our office 2 months in advance to schedule this appointment.  You may see Dr. Harrell Gave or one of the following Advanced Practice Providers on your designated Care Team:   Rosaria Ferries, PA-C . Jory Sims, DNP, ANP  Any Other Special Instructions Will Be Listed Below (If Applicable).  Please arrive at the [facility] main entrance of [facility] at xx:xx AM (30-45 minutes prior to test start time)  [facility] [place], [place] [address] [phone]  Proceed to the [facility] Radiology Department (First Floor).  Please follow these instructions carefully (unless otherwise directed):  Hold all erectile dysfunction medications at least 48 hours prior to test.  On the Night Before the Test: . Be sure to Drink plenty of water. . Do not consume any caffeinated/decaffeinated beverages or chocolate 12 hours prior to your test. . Do not take any antihistamines 12 hours prior to your test.   On the Day of the Test: . Drink plenty of water. Do not drink any water within one hour of the test. . Do not eat any food 4 hours prior to the test. . You may take your regular medications prior to the test.        After the Test: . Drink plenty of water. . After receiving IV contrast, you may experience a mild flushed feeling. This is normal. . On occasion, you may experience a mild rash up to  24 hours after the test. This is not dangerous. If this occurs, you can take Benadryl 25 mg and increase your fluid  intake. . If you experience trouble breathing, this can be serious. If it is severe call 911 IMMEDIATELY. If it is mild, please call our office.   Mediterranean Diet A Mediterranean diet refers to food and lifestyle choices that are based on the traditions of countries located on the [employer]. This way of eating has been shown to help prevent certain conditions and improve outcomes for people who have chronic diseases, like kidney disease and heart disease. What are tips for following this plan? Lifestyle  Cook and eat meals together with your family, when possible.  Drink enough fluid to keep your urine clear or pale yellow.  Be physically active every day. This includes: ? Aerobic exercise like running or swimming. ? Leisure activities like gardening, walking, or housework.  Get 7-8 hours of sleep each night.  If recommended by your health care provider, drink red wine in moderation. This means 1 glass a day for nonpregnant women and 2 glasses a day for men. A glass of wine equals 5 oz (150 mL). Reading food labels  Check the serving size of packaged foods. For foods such as rice and pasta, the serving size refers to the amount of cooked product, not dry.  Check the total fat in packaged foods. Avoid foods that have saturated fat or trans fats.  Check the ingredients list for added sugars, such as corn syrup. Shopping  At the grocery store, buy most of your food from the areas near the walls of the store. This includes: ? Fresh fruits and vegetables (produce). ? Grains, beans, nuts, and seeds. Some of these may be available in unpackaged forms or large amounts (in bulk). ? Fresh seafood. ? Poultry and eggs. ? Low-fat dairy products.  Buy whole ingredients instead of prepackaged foods.  Buy fresh fruits and vegetables in-season from local farmers markets.  Buy frozen fruits and vegetables in resealable bags.  If you do not have access to quality fresh seafood, buy  precooked frozen shrimp or canned fish, such as tuna, salmon, or sardines.  Buy small amounts of raw or cooked vegetables, salads, or olives from the deli or salad bar at your store.  Stock your pantry so you always have certain foods on hand, such as olive oil, canned tuna, canned tomatoes, rice, pasta, and beans. Cooking  Cook foods with extra-virgin olive oil instead of using butter or other vegetable oils.  Have meat as a side dish, and have vegetables or grains as your main dish. This means having meat in small portions or adding small amounts of meat to foods like pasta or stew.  Use beans or vegetables instead of meat in common dishes like chili or lasagna.  Experiment with different cooking methods. Try roasting or broiling vegetables instead of steaming or sauteing them.  Add frozen vegetables to soups, stews, pasta, or rice.  Add nuts or seeds for added healthy fat at each meal. You can add these to yogurt, salads, or vegetable dishes.  Marinate fish or vegetables using olive oil, lemon juice, garlic, and fresh herbs. Meal planning  Plan to eat 1 vegetarian meal one day each week. Try to work up to 2 vegetarian meals, if possible.  Eat seafood 2 or more times a week.  Have healthy snacks readily available, such as: ? Vegetable sticks with hummus. ? [place] yogurt. ? Fruit and nut trail  mix.  Eat balanced meals throughout the week. This includes: ? Fruit: 2-3 servings a day ? Vegetables: 4-5 servings a day ? Low-fat dairy: 2 servings a day ? Fish, poultry, or lean meat: 1 serving a day ? Beans and legumes: 2 or more servings a week ? Nuts and seeds: 1-2 servings a day ? Whole grains: 6-8 servings a day ? Extra-virgin olive oil: 3-4 servings a day  Limit red meat and sweets to only a few servings a month What are my food choices?  Mediterranean diet ? Recommended ? Grains: Whole-grain pasta. Brown rice. Bulgar wheat. Polenta. Couscous. Whole-wheat bread. [place]. ? Vegetables: Artichokes. Beets. Broccoli. Cabbage. Carrots. Eggplant. Green beans. Chard. Kale. Spinach. Onions. Leeks. Peas. Squash. Tomatoes. Peppers. Radishes. ? Fruits: Apples. Apricots. Avocado. Berries. Bananas. Cherries. Dates. Figs. Grapes. Lemons. Melon. Oranges. Peaches. Plums. Pomegranate. ? Meats and other protein foods: Beans. Almonds. Sunflower seeds. Pine nuts. Peanuts. [place]. Salmon. Scallops. Shrimp. [place]. Tilapia. Clams. Oysters. Eggs. ? Dairy: Low-fat milk. Cheese. Greek yogurt. ? Beverages: Water. Red wine. Herbal tea. ? Fats and oils: Extra virgin olive oil. Avocado oil. Grape seed oil. ? Sweets and desserts: [place] yogurt with honey. Baked apples. Poached pears. Trail mix. ? Seasoning and other foods: Basil. Cilantro. Coriander. Cumin. Mint. Parsley. Sage. Rosemary. Tarragon. Garlic. Oregano. Thyme. Pepper. Balsalmic vinegar. Tahini. Hummus. Tomato sauce. Olives. Mushrooms. ? Limit these ? Grains: Prepackaged pasta or rice dishes. Prepackaged cereal with added sugar. ? Vegetables: Deep fried potatoes (french fries). ? Fruits: Fruit canned in syrup. ? Meats and other protein foods: Beef. Pork. Lamb. Poultry with skin. Hot dogs. [place] Salines. ? Dairy: Ice cream. Sour cream. Whole milk. ? Beverages: Juice. Sugar-sweetened soft drinks. Beer. Liquor and spirits. ? Fats and oils: Butter. [place] oil. Vegetable oil. Beef fat (tallow). Lard. ? Sweets and desserts: Cookies. Cakes. Pies. Candy. ? Seasoning and other foods: Mayonnaise. Premade sauces and marinades. ? The items listed may not be a complete list. Talk with your dietitian about what dietary choices are right for you. Summary  The Mediterranean diet includes both food and lifestyle choices.  Eat a variety of fresh fruits and vegetables, beans, nuts, seeds, and whole grains.  Limit the amount of red meat and sweets that you eat.  Talk with your health care provider about whether it is safe for you to drink red wine in  moderation. This means 1 glass a day for nonpregnant women and 2 glasses a day for men. A glass of wine equals 5 oz (150 mL). This information is not intended to replace advice given to you by your health care provider. Make sure you discuss any questions you have with your health care provider. Document Released: 10/05/2015 Document Revised: 11/07/2015 Document Reviewed: 10/05/2015 Elsevier Interactive Patient Education  2018 [employer].     Signed, Buford Dresser, MD PhD 01/13/2018 9:22 AM    [place]

## 2018-03-06 DIAGNOSIS — L853 Xerosis cutis: Secondary | ICD-10-CM | POA: Diagnosis not present

## 2018-03-06 DIAGNOSIS — Z85828 Personal history of other malignant neoplasm of skin: Secondary | ICD-10-CM | POA: Diagnosis not present

## 2018-03-06 DIAGNOSIS — L820 Inflamed seborrheic keratosis: Secondary | ICD-10-CM | POA: Diagnosis not present

## 2018-03-06 DIAGNOSIS — L821 Other seborrheic keratosis: Secondary | ICD-10-CM | POA: Diagnosis not present

## 2018-03-06 DIAGNOSIS — H0265 Xanthelasma of left lower eyelid: Secondary | ICD-10-CM | POA: Diagnosis not present

## 2018-03-11 ENCOUNTER — Encounter: Payer: Self-pay | Admitting: Cardiology

## 2018-03-14 ENCOUNTER — Other Ambulatory Visit: Payer: Self-pay | Admitting: Family Medicine

## 2018-03-14 DIAGNOSIS — F428 Other obsessive-compulsive disorder: Secondary | ICD-10-CM

## 2018-03-16 DIAGNOSIS — H10412 Chronic giant papillary conjunctivitis, left eye: Secondary | ICD-10-CM | POA: Diagnosis not present

## 2018-03-16 DIAGNOSIS — H5213 Myopia, bilateral: Secondary | ICD-10-CM | POA: Diagnosis not present

## 2018-03-16 DIAGNOSIS — H2513 Age-related nuclear cataract, bilateral: Secondary | ICD-10-CM | POA: Diagnosis not present

## 2018-04-02 ENCOUNTER — Ambulatory Visit (INDEPENDENT_AMBULATORY_CARE_PROVIDER_SITE_OTHER)
Admission: RE | Admit: 2018-04-02 | Discharge: 2018-04-02 | Disposition: A | Discharge status: Home / Self Care | Payer: BLUE CROSS/BLUE SHIELD | Source: Ambulatory Visit | Attending: Family Medicine | Admitting: Family Medicine

## 2018-04-02 ENCOUNTER — Ambulatory Visit: Payer: BLUE CROSS/BLUE SHIELD | Admitting: Family Medicine

## 2018-04-02 ENCOUNTER — Other Ambulatory Visit (INDEPENDENT_AMBULATORY_CARE_PROVIDER_SITE_OTHER): Payer: BLUE CROSS/BLUE SHIELD

## 2018-04-02 VITALS — BP 146/80 | HR 74 | Ht 66.0 in | Wt 186.0 lb

## 2018-04-02 DIAGNOSIS — M545 Low back pain, unspecified: Secondary | ICD-10-CM

## 2018-04-02 DIAGNOSIS — M1711 Unilateral primary osteoarthritis, right knee: Secondary | ICD-10-CM

## 2018-04-02 DIAGNOSIS — G8929 Other chronic pain: Secondary | ICD-10-CM

## 2018-04-02 DIAGNOSIS — M48062 Spinal stenosis, lumbar region with neurogenic claudication: Secondary | ICD-10-CM | POA: Diagnosis not present

## 2018-04-02 DIAGNOSIS — E559 Vitamin D deficiency, unspecified: Secondary | ICD-10-CM

## 2018-04-02 DIAGNOSIS — M2550 Pain in unspecified joint: Secondary | ICD-10-CM | POA: Diagnosis not present

## 2018-04-02 LAB — CBC WITH DIFFERENTIAL/PLATELET
BASOS PCT: 1.1 % (ref 0.0–3.0)
Basophils Absolute: 0.1 10*3/uL (ref 0.0–0.1)
EOS PCT: 3.2 % (ref 0.0–5.0)
Eosinophils Absolute: 0.2 10*3/uL (ref 0.0–0.7)
HCT: 35.6 % — ABNORMAL LOW (ref 36.0–46.0)
Hemoglobin: 12.2 g/dL (ref 12.0–15.0)
LYMPHS ABS: 1.6 10*3/uL (ref 0.7–4.0)
Lymphocytes Relative: 27.1 % (ref 12.0–46.0)
MCHC: 34.4 g/dL (ref 30.0–36.0)
MCV: 92.1 fl (ref 78.0–100.0)
MONO ABS: 0.4 10*3/uL (ref 0.1–1.0)
Monocytes Relative: 6.1 % (ref 3.0–12.0)
NEUTROS ABS: 3.6 10*3/uL (ref 1.4–7.7)
NEUTROS PCT: 62.5 % (ref 43.0–77.0)
Platelets: 268 10*3/uL (ref 150.0–400.0)
RBC: 3.86 Mil/uL — ABNORMAL LOW (ref 3.87–5.11)
RDW: 12.4 % (ref 11.5–15.5)
WBC: 5.8 10*3/uL (ref 4.0–10.5)

## 2018-04-02 LAB — COMPREHENSIVE METABOLIC PANEL
ALT: 17 U/L (ref 0–35)
AST: 21 U/L (ref 0–37)
Albumin: 4.3 g/dL (ref 3.5–5.2)
Alkaline Phosphatase: 61 U/L (ref 39–117)
BUN: 17 mg/dL (ref 6–23)
CHLORIDE: 103 meq/L (ref 96–112)
CO2: 30 mEq/L (ref 19–32)
Calcium: 9.4 mg/dL (ref 8.4–10.5)
Creatinine, Ser: 0.67 mg/dL (ref 0.40–1.20)
GFR: 89.73 mL/min (ref >60.00)
GLUCOSE: 103 mg/dL — AB (ref 70–99)
POTASSIUM: 3.5 meq/L (ref 3.5–5.1)
SODIUM: 140 meq/L (ref 135–145)
Total Bilirubin: 0.6 mg/dL (ref 0.2–1.2)
Total Protein: 6.9 g/dL (ref 6.0–8.3)

## 2018-04-02 LAB — SEDIMENTATION RATE: SED RATE: 24 mm/h (ref 0–30)

## 2018-04-02 LAB — IBC PANEL
Iron: 80 ug/dL (ref 42–145)
SATURATION RATIOS: 20.8 % (ref 20.0–50.0)
Transferrin: 275 mg/dL (ref 212.0–360.0)

## 2018-04-02 LAB — C-REACTIVE PROTEIN: CRP: <1 mg/dL (ref 0.5–20.0)

## 2018-04-02 LAB — TSH: TSH: 1.76 u[IU]/mL (ref 0.35–4.50)

## 2018-04-02 LAB — URIC ACID: URIC ACID, SERUM: 4.7 mg/dL (ref 2.4–7.0)

## 2018-04-02 MED ORDER — VITAMIN D (ERGOCALCIFEROL) 1.25 MG (50000 UNIT) PO CAPS: 50000.0000 [IU] | ORAL_CAPSULE | ORAL | 0 refills | Status: DC

## 2018-04-02 MED ORDER — GABAPENTIN 100 MG PO CAPS: 200.0000 mg | ORAL_CAPSULE | Freq: Every day | ORAL | 3 refills | Status: DC

## 2018-04-02 NOTE — Patient Instructions (Signed)
Good to see you  Back xrays and labs downstairs They will call you on a brace Once weekly vitamin D for 12 weeks Gabapentin 200mg  at night Over the counter get  Turmeric 500mg  daily  Tart cherry extract any dose at night pennsaid pinkie amount topically 2 times daily as needed.  We will get you approved for another shot for the knee if needed See me again in 4 weeks

## 2018-04-02 NOTE — Progress Notes (Signed)
[name] [place], [place] [address] Phone: [phone] Subjective:    I [name] am serving as a Education administrator for Dr. Hulan Saas.   I'm seeing this patient by the request  of:  Copland, Gay Filler, MD   CC: Multiple complaints  [id number]  Kimberly Kent is a 63 y.o. female coming in with complaint of right sided lower back. Pain radiates to the knee. Saw Dr. Percell Miller and was told she had a labrum tear. Received an injection in the hip and lower back. Fell on ice when she fell on her hip. Pain just gets worse and she states she favors that side. History of right knee surgery. MRI states that she has spinal stenosis. Works at [employer] where she stands all day. Right leg can't extend fully. Bursitis and received injection. The patient is very active. Wears a knee brace. Numbness and tingling only 1 time. Back pain is the worse. Knee is swollen. Posterior knee pain. Pain wakes her up at night.  Onset- 3 years ago Location- bottom of the ribs, lower back Duration-  Character- Sharp "knife sticking into her lower back"  Aggravating factors- Walking  Reliving factors-  Therapies tried- [employer] out of 10.  Reviewing patient's chart back in 2015 patient did have an MRI of the right knee.  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  Degenerative ACL tear also noted.  MRI of the back from 2014 showed mild to moderate spinal stenosis at L4     Past Medical History:  Diagnosis Date  . Anemia   . Anxiety   . Arthritis   . Blood transfusion without reported diagnosis   . Depression   . Fibromyalgia 12/29/2013  . Hyperplastic colon polyp   . Migraines   . Neuromuscular disorder ([place])   . Patent foramen ovale   . Thyroid disease   . Vitamin D deficiency    Past Surgical History:  Procedure Laterality Date  . ABDOMINAL HYSTERECTOMY    . KNEE  ARTHROSCOPY     right knee/arthroscopic  . TONSILLECTOMY     Social History   Socioeconomic History  . Marital status: Single    Spouse name: Not on file  . Number of children: Not on file  . Years of education: Not on file  . Highest education level: Not on file  Occupational History  . Occupation: mortgage industry [employer]  Social Needs  . Financial resource strain: Not on file  . Food insecurity:    Worry: Not on file    Inability: Not on file  . Transportation needs:    Medical: Not on file    Non-medical: Not on file  Tobacco Use  . Smoking status: Never Smoker  . Smokeless tobacco: Never Used  Substance and Sexual Activity  . Alcohol use: No  . Drug use: No  . Sexual activity: Not on file  Lifestyle  . Physical activity:    Days per week: Not on file    Minutes per session: Not on file  . Stress: Not on file  Relationships  . Social connections:    Talks on phone: Not on file    Gets together: Not on file    Attends religious service: Not on file    Active member of club or organization: Not on file    Attends meetings of clubs or organizations: Not on file  Relationship status: Not on file  Other Topics Concern  . Not on file  Social History Narrative  . Not on file   Allergies  Allergen Reactions  . Bupropion Hcl     REACTION: hives  . Codeine     REACTION: nausea and vomiting   Family History  Problem Relation Age of Onset  . Cancer Mother   . Cancer Father   . Diabetes Father   . Parkinson's disease Maternal Grandmother   . Cancer Maternal Grandfather     Current Outpatient Medications (Endocrine & Metabolic):  .  SYNTHROID 75 MCG tablet, TAKE 1 TABLET (75 MCG TOTAL) BY MOUTH DAILY BEFORE BREAKFAST.      Current Outpatient Medications (Other):  .  cholecalciferol (VITAMIN D) 1000 UNITS tablet, Take 1,000 Units by mouth daily. Marland Kitchen  FLUoxetine (PROZAC) 10 MG capsule, TAKE 1 CAPSULE BY MOUTH EVERY DAY .  Glucosamine-Chondroitin  1500-1200 MG/30ML LIQD, Take by mouth. .  magnesium 30 MG tablet, Take 30 mg by mouth 2 (two) times daily. Marland Kitchen  gabapentin (NEURONTIN) 100 MG capsule, Take 2 capsules (200 mg total) by mouth at bedtime. .  Vitamin D, Ergocalciferol, (DRISDOL) 1.25 MG (50000 UT) CAPS capsule, Take 1 capsule (50,000 Units total) by mouth every 7 (seven) days.    Past medical history, social, surgical and family history all reviewed in electronic medical record.  No pertanent information unless stated regarding to the chief complaint.   Review of Systems:  No headache, visual changes, nausea, vomiting, diarrhea, constipation, dizziness, abdominal pain, skin rash, fevers, chills, night sweats, weight loss, swollen lymph nodes, body aches, joint swelling, muscle aches, chest pain, shortness of breath, mood changes.   Objective  Blood pressure (!) 146/80, pulse 74, height 5\' 6"  (1.676 m), weight 186 lb (84.4 kg), SpO2 98 %.     General: No apparent distress alert and oriented x3 mood and affect normal, dressed appropriately.  HEENT: Pupils equal, extraocular movements intact  Respiratory: Patient's speak in full sentences and does not appear short of breath  Cardiovascular: Trace lower extremity edema, non tender, no erythema  Skin: Warm dry intact with no signs of infection or rash on extremities or on axial skeleton.  Abdomen: Soft nontender  Neuro: Cranial nerves II through XII are intact, neurovascularly intact in all extremities with 2+ DTRs and 2+ pulses.  Lymph: No lymphadenopathy of posterior or anterior cervical chain or axillae bilaterally.  Gait antalgic MSK: Mild to moderate tender with mild limited range of motion stabilityshoulders, elbows, wrist, hip, and ankles bilaterally.  Patient is more tender to palpation than what would be expected Knee: Right valgus deformity noted. Large thigh to calf ratio.  Tender to palpation over medial and PF joint line.  Limited range of motion lacking last 7  degrees of extension in the last 15 degrees of flexion. instability with valgus force.  painful patellar compression. Patellar glide with moderate crepitus. Patellar and quadriceps tendons unremarkable. Hamstring and quadriceps strength is normal. Contralateral knee shows mild varus deformity noted but no significant instability  Back Exam:  Inspection: Loss of lordosis Motion: Flexion 35 deg, Extension 20 deg, Side Bending to 35 deg bilaterally, Rotation to 35 deg bilaterally  SLR laying: Negative severe tightness of the hamstrings bilaterally. XSLR laying: Negative  Palpable tenderness: Tender to palpation in paraspinal musculature lumbar spine right greater than left.  Positive Corky Sox. FABER: Unable to do secondary to tightness of the right side. Sensory change: Gross sensation intact to all lumbar and sacral  dermatomes.  Reflexes: 2+ at both patellar tendons, 2+ at achilles tendons, Babinski's downgoing.  Strength at foot  Plantar-flexion: 5/5 Dorsi-flexion: 5/5 Eversion: 5/5 Inversion: 5/5  Leg strength  Quad: 5/5 Hamstring: 5/5 Hip flexor: 5/5 Hip abductors: 5/5    [id number]; 15 additional minutes spent for Therapeutic exercises as stated in above notes.  This included exercises focusing on stretching, strengthening, with significant focus on eccentric aspects.   Long term goals include an improvement in range of motion, strength, endurance as well as avoiding reinjury. Patient's frequency would include in 1-2 times a day, 3-5 times a week for a duration of 6-12 weeks.  Low back exercises that included:  Pelvic tilt/bracing instruction to focus on control of the pelvic girdle and lower abdominal muscles  Glute strengthening exercises, focusing on proper firing of the glutes without engaging the low back muscles Proper stretching techniques for maximum relief for the hamstrings, hip flexors, low back and some rotation where tolerated  Proper technique shown and discussed handout in great  detail with ATC.  All questions were discussed and answered.    After informed written and verbal consent, patient was seated on exam table. Right knee was prepped with alcohol swab and utilizing anterolateral approach, patient's right knee space was injected with 4:1  marcaine 0.5%: Kenalog 40mg /dL. Patient tolerated the procedure well without immediate complications. Impression and Recommendations:     This case required medical decision making of moderate complexity. The above documentation has been reviewed and is accurate and complete Lyndal Pulley, DO       Note: This dictation was prepared with Dragon dictation along with smaller phrase technology. Any transcriptional errors that result from this process are unintentional.

## 2018-04-03 ENCOUNTER — Encounter: Payer: Self-pay | Admitting: Family Medicine

## 2018-04-03 DIAGNOSIS — M48061 Spinal stenosis, lumbar region without neurogenic claudication: Secondary | ICD-10-CM | POA: Insufficient documentation

## 2018-04-03 DIAGNOSIS — M2550 Pain in unspecified joint: Secondary | ICD-10-CM | POA: Insufficient documentation

## 2018-04-03 DIAGNOSIS — M1711 Unilateral primary osteoarthritis, right knee: Secondary | ICD-10-CM | POA: Insufficient documentation

## 2018-04-03 NOTE — Assessment & Plan Note (Signed)
Polyarthralgia.  Patient's arthritic changes of multiple joints seems to be a little advanced for patient's age.  Laboratory work-up ordered today.  Depending on findings we will adjust medical management.

## 2018-04-03 NOTE — Assessment & Plan Note (Signed)
Patient had severe arthritis of the right knee previously 5 years MRI.  It has progressed since then.  Increasing instability.  Due to patient is thigh to calf ratio and instability of the knee I do believe that custom bracing would be beneficial.  Patient wants to avoid any surgical intervention.  Discussed the possibility of corticosteroid injection, formal physical therapy, Visco supplementation and experimental injections as well.  Patient would like to start with the home exercises and see how patient does as well as with the custom bracing.  Patient will follow-up again in 4 weeks  Addendum: Patient did have an injection of the knee today.  Changed her mind on the way out.  Responded well.  We will see if patient getting approval for Visco supplementation

## 2018-04-03 NOTE — Assessment & Plan Note (Signed)
Start once weekly supplementation.  May help with muscle strength and endurance

## 2018-04-03 NOTE — Assessment & Plan Note (Signed)
Per patient's subjective symptoms patient is having signs that is consistent with patient's previous MRI.  Patient has seen neurosurgery previously and has had injections in the past.  Starting with home exercises,_core strengthening.  Gabapentin given for nighttime relief right as well as the neurologic or neurogenic portion.  Follow-up with me again in 4 weeks.

## 2018-04-07 LAB — SPECIMEN STATUS REPORT

## 2018-04-07 LAB — CYCLIC CITRUL PEPTIDE ANTIBODY, IGG

## 2018-04-07 LAB — PTH, INTACT AND CALCIUM
CALCIUM: 9.5 mg/dL (ref 8.6–10.4)
PTH: 42 pg/mL (ref 14–64)

## 2018-04-07 LAB — ANGIOTENSIN CONVERTING ENZYME: ANGIOTENSIN-CONVERTING ENZYME: 28 U/L (ref 9–67)

## 2018-04-07 LAB — VITAMIN D 1,25 DIHYDROXY
VITAMIN D3 1, 25 (OH): 53 pg/mL
Vitamin D 1, 25 (OH)2 Total: 53 pg/mL (ref 18–72)

## 2018-04-07 LAB — ANA: ANA: NEGATIVE

## 2018-04-07 LAB — CALCIUM, IONIZED: Calcium, Ion: 5.21 mg/dL (ref 4.8–5.6)

## 2018-04-07 LAB — RHEUMATOID FACTOR

## 2018-04-24 ENCOUNTER — Other Ambulatory Visit: Payer: Self-pay | Admitting: Family Medicine

## 2018-04-27 DIAGNOSIS — M1711 Unilateral primary osteoarthritis, right knee: Secondary | ICD-10-CM | POA: Diagnosis not present

## 2018-04-30 ENCOUNTER — Ambulatory Visit: Payer: Self-pay

## 2018-04-30 ENCOUNTER — Ambulatory Visit: Payer: BLUE CROSS/BLUE SHIELD | Admitting: Family Medicine

## 2018-04-30 ENCOUNTER — Encounter: Payer: Self-pay | Admitting: Family Medicine

## 2018-04-30 VITALS — BP 118/68 | HR 76 | Ht 66.0 in | Wt 184.0 lb

## 2018-04-30 DIAGNOSIS — M1711 Unilateral primary osteoarthritis, right knee: Secondary | ICD-10-CM | POA: Diagnosis not present

## 2018-04-30 DIAGNOSIS — M7061 Trochanteric bursitis, right hip: Secondary | ICD-10-CM | POA: Diagnosis not present

## 2018-04-30 DIAGNOSIS — M25551 Pain in right hip: Secondary | ICD-10-CM | POA: Diagnosis not present

## 2018-04-30 DIAGNOSIS — M48062 Spinal stenosis, lumbar region with neurogenic claudication: Secondary | ICD-10-CM | POA: Diagnosis not present

## 2018-04-30 MED ORDER — MELOXICAM 15 MG PO TABS: 15.0000 mg | ORAL_TABLET | Freq: Every day | ORAL | 0 refills | Status: DC

## 2018-04-30 NOTE — Progress Notes (Signed)
[facility] [place], [place] [address] Phone: [phone] Subjective:    Kimberly Kent, am serving as a scribe for Dr. Hulan Saas.  CC: Back pain and knee pain follow-up  [id number]   04/02/2018: Per patient's subjective symptoms patient is having signs that is consistent with patient's previous MRI.  Patient has seen neurosurgery previously and has had injections in the past.  Starting with home exercises,_core strengthening.  Gabapentin given     Update 04/30/2018: Kimberly Kent is a 63 y.o. female coming in with complaint of knee and back pain. Patient feels like her knee pain is secondary to her hip pain. Prolonged standing increases her pain. Is using gabapentin at night for pain while sleeping.  Patient states that the knee is improved a little bit from the injection.  Patient states that the hip on the lateral aspect seems to be the worst at this moment.    Patient was seen previously.  Back x-ray showed mild degenerative changes that were independently visualized by me of the lumbar spine on April 03, 2018.  Patient had laboratory work-up to that was unremarkable showing all normal vitamins, electrolytes, calcium levels, negative for autoimmune disease  Past Medical History:  Diagnosis Date  . Anemia   . Anxiety   . Arthritis   . Blood transfusion without reported diagnosis   . Depression   . Fibromyalgia 12/29/2013  . Hyperplastic colon polyp   . Migraines   . Neuromuscular disorder ([place])   . Patent foramen ovale   . Thyroid disease   . Vitamin D deficiency    Past Surgical History:  Procedure Laterality Date  . ABDOMINAL HYSTERECTOMY    . KNEE ARTHROSCOPY     right knee/arthroscopic  . TONSILLECTOMY     Social History   Socioeconomic History  . Marital status: Single    Spouse name: Not on file  . Number of children: Not on file  . Years of education: Not on file  . Highest education level: Not on file    Occupational History  . Occupation: mortgage industry [employer]  Social Needs  . Financial resource strain: Not on file  . Food insecurity:    Worry: Not on file    Inability: Not on file  . Transportation needs:    Medical: Not on file    Non-medical: Not on file  Tobacco Use  . Smoking status: Never Smoker  . Smokeless tobacco: Never Used  Substance and Sexual Activity  . Alcohol use: Kent  . Drug use: Kent  . Sexual activity: Not on file  Lifestyle  . Physical activity:    Days per week: Not on file    Minutes per session: Not on file  . Stress: Not on file  Relationships  . Social connections:    Talks on phone: Not on file    Gets together: Not on file    Attends religious service: Not on file    Active member of club or organization: Not on file    Attends meetings of clubs or organizations: Not on file    Relationship status: Not on file  Other Topics Concern  . Not on file  Social History Narrative  . Not on file   Allergies  Allergen Reactions  . Bupropion Hcl     REACTION: hives  . Codeine     REACTION: nausea and vomiting   Family History  Problem Relation Age of Onset  .  Cancer Mother   . Cancer Father   . Diabetes Father   . Parkinson's disease Maternal Grandmother   . Cancer Maternal Grandfather     Current Outpatient Medications (Endocrine & Metabolic):  .  SYNTHROID 75 MCG tablet, TAKE 1 TABLET (75 MCG TOTAL) BY MOUTH DAILY BEFORE BREAKFAST.      Current Outpatient Medications (Other):  .  cholecalciferol (VITAMIN D) 1000 UNITS tablet, Take 1,000 Units by mouth daily. Marland Kitchen  FLUoxetine (PROZAC) 10 MG capsule, TAKE 1 CAPSULE BY MOUTH EVERY DAY .  gabapentin (NEURONTIN) 100 MG capsule, TAKE 2 CAPSULES (200 MG TOTAL) BY MOUTH AT BEDTIME. Marland Kitchen  Glucosamine-Chondroitin 1500-1200 MG/30ML LIQD, Take by mouth. .  magnesium 30 MG tablet, Take 30 mg by mouth 2 (two) times daily. .  Vitamin D, Ergocalciferol, (DRISDOL) 1.25 MG (50000 UT) CAPS capsule,  Take 1 capsule (50,000 Units total) by mouth every 7 (seven) days.    Past medical history, social, surgical and family history all reviewed in electronic medical record.  Kent pertanent information unless stated regarding to the chief complaint.   Review of Systems:  Kent headache, visual changes, nausea, vomiting, diarrhea, constipation, dizziness, abdominal pain, skin rash, fevers, chills, night sweats, weight loss, swollen lymph nodes, body aches, joint swelling, muscle aches, chest pain, shortness of breath, mood changes.   Objective  There were Kent vitals taken for this visit. Systems examined below as of    General: Kent apparent distress alert and oriented x3 mood and affect normal, dressed appropriately.  HEENT: Pupils equal, extraocular movements intact  Respiratory: Patient's speak in full sentences and does not appear short of breath  Cardiovascular: Kent lower extremity edema, non tender, Kent erythema  Skin: Warm dry intact with Kent signs of infection or rash on extremities or on axial skeleton.  Abdomen: Soft nontender  Neuro: Cranial nerves II through XII are intact, neurovascularly intact in all extremities with 2+ DTRs and 2+ pulses.  Lymph: Kent lymphadenopathy of posterior or anterior cervical chain or axillae bilaterally.  MSK:  Non tender with full range of motion and good stability and symmetric strength and tone of shoulders, elbows, wrist,and ankles bilaterally.  Back Exam:  Inspection: Mild loss of lordosis Motion: Flexion 45 deg, Extension 25 deg, Side Bending to 45 deg bilaterally,  Rotation to 45 deg bilaterally  SLR laying: Negative  XSLR laying: Negative  Palpable tenderness: Tender to palpation the paraspinal musculature lumbar spine. FABER: Positive Faber.  Severe tenderness over the right hip trochanteric area Sensory change: Gross sensation intact to all lumbar and sacral dermatomes.  Reflexes: 2+ at both patellar tendons, 2+ at achilles tendons, Babinski's  downgoing.  Strength at foot  Plantar-flexion: 5/5 Dorsi-flexion: 5/5 Eversion: 5/5 Inversion: 5/5  Leg strength  Quad: 5/5 Hamstring: 5/5 Hip flexor: 5/5 Hip abductors: 5/5  Mild antalgic gait still with patient favoring the right knee but does have osteoarthritic changes and instability   Procedure: Real-time Ultrasound Guided Injection of right greater trochanteric bursitis secondary to patient's body habitus Device: GE Logiq Q7 Ultrasound guided injection is preferred based studies that show increased duration, increased effect, greater accuracy, decreased procedural pain, increased response rate, and decreased cost with ultrasound guided versus blind injection.  Verbal informed consent obtained.  Time-out conducted.  Noted Kent overlying erythema, induration, or other signs of local infection.  Skin prepped in a sterile fashion.  Local anesthesia: Topical Ethyl chloride.  With sterile technique and under real time ultrasound guidance:  Greater trochanteric area was visualized and  patient's bursa was noted. A 22-gauge 3 inch needle was inserted and 4 cc of 0.5% Marcaine and 1 cc of Kenalog 40 mg/dL was injected. Pictures taken Completed without difficulty  Pain immediately resolved suggesting accurate placement of the medication.  Advised to call if fevers/chills, erythema, induration, drainage, or persistent bleeding.  Images permanently stored and available for review in the ultrasound unit.  Impression: Technically successful ultrasound guided injection.     Impression and Recommendations:     This case required medical decision making of moderate complexity. The above documentation has been reviewed and is accurate and complete Lyndal Pulley, DO       Note: This dictation was prepared with Dragon dictation along with smaller phrase technology. Any transcriptional errors that result from this process are unintentional.

## 2018-04-30 NOTE — Assessment & Plan Note (Signed)
Mild improvement.  Will consider possible viscosupplementation.  Patient is to wear the brace but is not at the moment.  Hopefully this will be beneficial for her.  Continue the topical anti-inflammatories and icing regimen.  Patient will follow-up again in 4 weeks

## 2018-04-30 NOTE — Patient Instructions (Signed)
Good to see you  Ice is your friend We will check on the gel injections for the knee Injected  The GT bursitis today and hope it helps Exercises for the piriformis See me again in 5-6 weeks.

## 2018-04-30 NOTE — Assessment & Plan Note (Signed)
Injection given today.  Differential is still more in the lumbar radiculopathy and spinal stenosis.  Patient encouraged to try the gabapentin on a more regular basis.  She has not only been using it intermittently.  We discussed icing regimen and home exercises, topical anti-inflammatories.  Discussed core strengthening hip abductor strengthening.  Follow-up again in 4 to 6 weeks

## 2018-06-07 NOTE — Progress Notes (Deleted)
[name] [place], [place] [address] Phone: [phone] Subjective:    I'm seeing this patient by the request  of:    CC: Hip pain, knee pain and back pain follow-up  [id number]  Kimberly Kent is a 63 y.o. female coming in with complaint of ***  Onset-  Location Duration-  Character- Aggravating factors- Reliving factors-  Therapies tried-  Severity-     Past Medical History:  Diagnosis Date  . Anemia   . Anxiety   . Arthritis   . Blood transfusion without reported diagnosis   . Depression   . Fibromyalgia 12/29/2013  . Hyperplastic colon polyp   . Migraines   . Neuromuscular disorder ([place])   . Patent foramen ovale   . Thyroid disease   . Vitamin D deficiency    Past Surgical History:  Procedure Laterality Date  . ABDOMINAL HYSTERECTOMY    . KNEE ARTHROSCOPY     right knee/arthroscopic  . TONSILLECTOMY     Social History   Socioeconomic History  . Marital status: Single    Spouse name: Not on file  . Number of children: Not on file  . Years of education: Not on file  . Highest education level: Not on file  Occupational History  . Occupation: mortgage industry [employer]  Social Needs  . Financial resource strain: Not on file  . Food insecurity:    Worry: Not on file    Inability: Not on file  . Transportation needs:    Medical: Not on file    Non-medical: Not on file  Tobacco Use  . Smoking status: Never Smoker  . Smokeless tobacco: Never Used  Substance and Sexual Activity  . Alcohol use: No  . Drug use: No  . Sexual activity: Not on file  Lifestyle  . Physical activity:    Days per week: Not on file    Minutes per session: Not on file  . Stress: Not on file  Relationships  . Social connections:    Talks on phone: Not on file    Gets together: Not on file    Attends religious service: Not on file    Active member of club or organization: Not on file    Attends meetings of clubs or  organizations: Not on file    Relationship status: Not on file  Other Topics Concern  . Not on file  Social History Narrative  . Not on file   Allergies  Allergen Reactions  . Bupropion Hcl     REACTION: hives  . Codeine     REACTION: nausea and vomiting   Family History  Problem Relation Age of Onset  . Cancer Mother   . Cancer Father   . Diabetes Father   . Parkinson's disease Maternal Grandmother   . Cancer Maternal Grandfather     Current Outpatient Medications (Endocrine & Metabolic):  .  SYNTHROID 75 MCG tablet, TAKE 1 TABLET (75 MCG TOTAL) BY MOUTH DAILY BEFORE BREAKFAST.    Current Outpatient Medications (Analgesics):  .  meloxicam (MOBIC) 15 MG tablet, Take 1 tablet (15 mg total) by mouth daily.   Current Outpatient Medications (Other):  .  cholecalciferol (VITAMIN D) 1000 UNITS tablet, Take 1,000 Units by mouth daily. Marland Kitchen  FLUoxetine (PROZAC) 10 MG capsule, TAKE 1 CAPSULE BY MOUTH EVERY DAY .  gabapentin (NEURONTIN) 100 MG capsule, TAKE 2 CAPSULES (200 MG TOTAL) BY MOUTH AT BEDTIME. Marland Kitchen  Glucosamine-Chondroitin 1500-1200 MG/30ML  LIQD, Take by mouth. .  magnesium 30 MG tablet, Take 30 mg by mouth 2 (two) times daily. .  Vitamin D, Ergocalciferol, (DRISDOL) 1.25 MG (50000 UT) CAPS capsule, Take 1 capsule (50,000 Units total) by mouth every 7 (seven) days.    Past medical history, social, surgical and family history all reviewed in electronic medical record.  No pertanent information unless stated regarding to the chief complaint.   Review of Systems:  No headache, visual changes, nausea, vomiting, diarrhea, constipation, dizziness, abdominal pain, skin rash, fevers, chills, night sweats, weight loss, swollen lymph nodes, body aches, joint swelling, muscle aches, chest pain, shortness of breath, mood changes.   Objective  There were no vitals taken for this visit. Systems examined below as of    General: No apparent distress alert and oriented x3 mood and affect  normal, dressed appropriately.  HEENT: Pupils equal, extraocular movements intact  Respiratory: Patient's speak in full sentences and does not appear short of breath  Cardiovascular: No lower extremity edema, non tender, no erythema  Skin: Warm dry intact with no signs of infection or rash on extremities or on axial skeleton.  Abdomen: Soft nontender  Neuro: Cranial nerves II through XII are intact, neurovascularly intact in all extremities with 2+ DTRs and 2+ pulses.  Lymph: No lymphadenopathy of posterior or anterior cervical chain or axillae bilaterally.  Gait normal with good balance and coordination.  MSK:  Non tender with full range of motion and good stability and symmetric strength and tone of shoulders, elbows, wrist, hip, knee and ankles bilaterally.     Impression and Recommendations:     This case required medical decision making of moderate complexity. The above documentation has been reviewed and is accurate and complete Lyndal Pulley, DO       Note: This dictation was prepared with Dragon dictation along with smaller phrase technology. Any transcriptional errors that result from this process are unintentional.

## 2018-06-08 ENCOUNTER — Ambulatory Visit: Payer: BLUE CROSS/BLUE SHIELD | Admitting: Family Medicine

## 2018-06-08 ENCOUNTER — Telehealth: Payer: Self-pay

## 2018-06-08 DIAGNOSIS — H61001 Unspecified perichondritis of right external ear: Secondary | ICD-10-CM | POA: Diagnosis not present

## 2018-06-08 DIAGNOSIS — Z85828 Personal history of other malignant neoplasm of skin: Secondary | ICD-10-CM | POA: Diagnosis not present

## 2018-06-08 DIAGNOSIS — L570 Actinic keratosis: Secondary | ICD-10-CM | POA: Diagnosis not present

## 2018-06-08 DIAGNOSIS — L821 Other seborrheic keratosis: Secondary | ICD-10-CM | POA: Diagnosis not present

## 2018-06-08 DIAGNOSIS — D692 Other nonthrombocytopenic purpura: Secondary | ICD-10-CM | POA: Diagnosis not present

## 2018-06-08 NOTE — Telephone Encounter (Signed)
Called patient to inform her about Dr. Tamala Julian being out of the office today. Decided to cancel appointment due to being out of town.

## 2018-06-18 ENCOUNTER — Other Ambulatory Visit: Payer: Self-pay | Admitting: Family Medicine

## 2018-06-25 ENCOUNTER — Other Ambulatory Visit: Payer: Self-pay

## 2018-06-25 ENCOUNTER — Ambulatory Visit: Payer: BLUE CROSS/BLUE SHIELD | Admitting: Family Medicine

## 2018-06-25 ENCOUNTER — Encounter: Payer: Self-pay | Admitting: Family Medicine

## 2018-06-25 VITALS — BP 120/72 | HR 86 | Ht 66.0 in | Wt 179.0 lb

## 2018-06-25 DIAGNOSIS — M5416 Radiculopathy, lumbar region: Secondary | ICD-10-CM | POA: Diagnosis not present

## 2018-06-25 DIAGNOSIS — M48062 Spinal stenosis, lumbar region with neurogenic claudication: Secondary | ICD-10-CM

## 2018-06-25 MED ORDER — KETOROLAC TROMETHAMINE 60 MG/2ML IM SOLN
60.0000 mg | Freq: Once | INTRAMUSCULAR | Status: AC
  Administered 2018-06-25: 60 mg via INTRAMUSCULAR

## 2018-06-25 MED ORDER — METHYLPREDNISOLONE ACETATE 80 MG/ML IJ SUSP
80.0000 mg | Freq: Once | INTRAMUSCULAR | Status: AC
  Administered 2018-06-25: 80 mg via INTRAMUSCULAR

## 2018-06-25 MED ORDER — TIZANIDINE HCL 4 MG PO TABS: 4.0000 mg | ORAL_TABLET | Freq: Every evening | ORAL | 2 refills | Status: DC

## 2018-06-25 NOTE — Progress Notes (Signed)
[name] [address] [place], [place] [address] Phone: [phone] Subjective:   I Kimberly Kent am serving as a Education administrator for Dr. Hulan Saas.    CC: Low back pain  CXK:[id number]   04/30/2018 Injection given today.  Differential is still more in the lumbar radiculopathy and spinal stenosis.  Patient encouraged to try the gabapentin on a more regular basis.  She has not only been using it intermittently.  We discussed icing regimen and home exercises, topical anti-inflammatories.  Discussed core strengthening hip abductor strengthening.  Follow-up again in 4 to 6 weeks  Mild improvement.  Will consider possible viscosupplementation.  Patient is to wear the brace but is not at the moment.  Hopefully this will be beneficial for her.  Continue the topical anti-inflammatories and icing regimen.  Patient will follow-up again in 4 weeks  06/25/2018 Kimberly Kent is a 63 y.o. female coming in with complaint of right hip pain. Having right rib pain. Feels like a catch. Doing about 5 miles a day at work. Using heat. Feels sharp pains. Feels like a muscle spasm. Has been exercising. Patient asked about a back brace after realizing she has more comfort and support using an elastic back brace.  Denies any true chest pain.  Denies any pain which increasing breaths.  Has not been having any illness recently  Onset- Acute (Monday) Location - Right rib  Duration-  Character-more of an ache that is worse with movement Aggravating factors- sore  Reliving factors-seems to stop moving seems to be better. Therapies tried-  Severity-8 out of 10.    Patient has had x-rays done before.  Patient did have lumbar spine x-rays taken on April 03, 2018.  These were independently visualized by me.  Showed mild degenerative changes of the lumbar spine without any specific.  Moderate narrowing may be at L4-L5  Past Medical History:  Diagnosis Date  . Anemia   . Anxiety   . Arthritis    . Blood transfusion without reported diagnosis   . Depression   . Fibromyalgia 12/29/2013  . Hyperplastic colon polyp   . Migraines   . Neuromuscular disorder ([place])   . Patent foramen ovale   . Thyroid disease   . Vitamin D deficiency    Past Surgical History:  Procedure Laterality Date  . ABDOMINAL HYSTERECTOMY    . KNEE ARTHROSCOPY     right knee/arthroscopic  . TONSILLECTOMY     Social History   Socioeconomic History  . Marital status: Single    Spouse name: Not on file  . Number of children: Not on file  . Years of education: Not on file  . Highest education level: Not on file  Occupational History  . Occupation: mortgage industry [employer]  Social Needs  . Financial resource strain: Not on file  . Food insecurity:    Worry: Not on file    Inability: Not on file  . Transportation needs:    Medical: Not on file    Non-medical: Not on file  Tobacco Use  . Smoking status: Never Smoker  . Smokeless tobacco: Never Used  Substance and Sexual Activity  . Alcohol use: No  . Drug use: No  . Sexual activity: Not on file  Lifestyle  . Physical activity:    Days per week: Not on file    Minutes per session: Not on file  . Stress: Not on file  Relationships  . Social connections:  Talks on phone: Not on file    Gets together: Not on file    Attends religious service: Not on file    Active member of club or organization: Not on file    Attends meetings of clubs or organizations: Not on file    Relationship status: Not on file  Other Topics Concern  . Not on file  Social History Narrative  . Not on file   Allergies  Allergen Reactions  . Bupropion Hcl     REACTION: hives  . Codeine     REACTION: nausea and vomiting   Family History  Problem Relation Age of Onset  . Cancer Mother   . Cancer Father   . Diabetes Father   . Parkinson's disease Maternal Grandmother   . Cancer Maternal Grandfather     Current Outpatient Medications (Endocrine &  Metabolic):  .  SYNTHROID 75 MCG tablet, TAKE 1 TABLET (75 MCG TOTAL) BY MOUTH DAILY BEFORE BREAKFAST.    Current Outpatient Medications (Analgesics):  .  meloxicam (MOBIC) 15 MG tablet, Take 1 tablet (15 mg total) by mouth daily.   Current Outpatient Medications (Other):  .  cholecalciferol (VITAMIN D) 1000 UNITS tablet, Take 1,000 Units by mouth daily. Marland Kitchen  FLUoxetine (PROZAC) 10 MG capsule, TAKE 1 CAPSULE BY MOUTH EVERY DAY .  gabapentin (NEURONTIN) 100 MG capsule, TAKE 2 CAPSULES (200 MG TOTAL) BY MOUTH AT BEDTIME. Marland Kitchen  Glucosamine-Chondroitin 1500-1200 MG/30ML LIQD, Take by mouth. .  magnesium 30 MG tablet, Take 30 mg by mouth 2 (two) times daily. .  Vitamin D, Ergocalciferol, (DRISDOL) 1.25 MG (50000 UT) CAPS capsule, TAKE 1 CAPSULE (50,000 UNITS TOTAL) BY MOUTH EVERY 7 (SEVEN) DAYS. Marland Kitchen  tiZANidine (ZANAFLEX) 4 MG tablet, Take 1 tablet (4 mg total) by mouth Nightly for 10 days.    Past medical history, social, surgical and family history all reviewed in electronic medical record.  No pertanent information unless stated regarding to the chief complaint.   Review of Systems:  No headache, visual changes, nausea, vomiting, diarrhea, constipation, dizziness, abdominal pain, skin rash, fevers, chills, night sweats, weight loss, swollen lymph nodes, body aches, joint swelling,chest pain, shortness of breath, mood changes.  Positive muscle aches Objective  Blood pressure 120/72, pulse 86, height 5\' 6"  (1.676 m), weight 179 lb (81.2 kg), SpO2 97 %.   General: No apparent distress alert and oriented x3 mood and affect normal, dressed appropriately.  HEENT: Pupils equal, extraocular movements intact  Respiratory: Patient's speak in full sentences and does not appear short of breath  Cardiovascular: No lower extremity edema, non tender, no erythema  Skin: Warm dry intact with no signs of infection or rash on extremities or on axial skeleton.  Abdomen: Soft nontender  Neuro: Cranial nerves II  through XII are intact, neurovascularly intact in all extremities with 2+ DTRs and 2+ pulses.  Lymph: No lymphadenopathy of posterior or anterior cervical chain or axillae bilaterally.  Gait normal with good balance and coordination.  MSK:  Non tender with full range of motion and good stability and symmetric strength and tone of shoulders, elbows, wrist, hip, knee and ankles bilaterally.  Patient with tenderness to palpation more in the paraspinal musculature of the lumbar spine near the thoracolumbar junction on the right side  Pain seems to be more over the psoas muscle.  Negative costovertebral tenderness noted.  No masses appreciated.  Negative Faber.  Neurovascular intact distally.   Impression and Recommendations:     This case required medical decision  making of moderate complexity. The above documentation has been reviewed and is accurate and complete Lyndal Pulley, DO       Note: This dictation was prepared with Dragon dictation along with smaller phrase technology. Any transcriptional errors that result from this process are unintentional.

## 2018-06-25 NOTE — Patient Instructions (Addendum)
Good to see you  Ice 20 minutes 2 times daily. Usually after activity and before bed. Injection today to help pain.  Zanaflex tonight to help muscle spasm.  Should calm down quickly  Send a my chart message Monday so I know what is going on

## 2018-06-26 ENCOUNTER — Encounter: Payer: Self-pay | Admitting: Family Medicine

## 2018-06-26 NOTE — Assessment & Plan Note (Signed)
Known mild lumbar stenosis.  Patient does have minimal degenerative disc disease.  We discussed with patient different treatment options.  Seems to be more of a muscle spasm meloxicam and gabapentin given.  Patient given Zanaflex for breakthrough pain.  Follow-up again in 2 weeks

## 2018-07-01 ENCOUNTER — Telehealth: Payer: Self-pay

## 2018-07-01 NOTE — Telephone Encounter (Signed)
Called patient about picking up back brace. Left message to call back.

## 2018-07-02 ENCOUNTER — Telehealth: Payer: Self-pay

## 2018-07-02 NOTE — Telephone Encounter (Signed)
Called patient to let her know that her back brace is here. Left message.

## 2018-07-08 DIAGNOSIS — M5416 Radiculopathy, lumbar region: Secondary | ICD-10-CM | POA: Diagnosis not present

## 2018-07-22 ENCOUNTER — Other Ambulatory Visit: Payer: Self-pay | Admitting: Family Medicine

## 2018-08-15 ENCOUNTER — Encounter: Payer: Self-pay | Admitting: Family Medicine

## 2018-08-18 ENCOUNTER — Ambulatory Visit: Payer: Self-pay

## 2018-08-18 NOTE — Telephone Encounter (Signed)
Appt scheduled

## 2018-08-18 NOTE — Telephone Encounter (Signed)
Pt called stating that she has right side rib pain that radiates into her back  She states the area is tender to touch. Today she rates the pain at 3 but Saturday she states it was an 8. She also has noticed that she has severe cramps to her left calf.  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 She denies nausea vomiting sweats. Care advice read to patient. Patient verbalized understanding of instructions. Call transferred to office for scheduling.  Reason for Disposition . [1] Chest pain lasts > 5 minutes AND [2] occurred > 3 days ago (72 hours) AND [3] NO chest pain or cardiac symptoms now  Answer Assessment - Initial Assessment Questions 1. LOCATION: "Where does it hurt?"       Right rib 2. RADIATION: "Does the pain go anywhere else?" (e.g., into neck, jaw, arms, back)    no 3. ONSET: "When did the chest pain begin?" (Minutes, hours or days)     2 weeks ago 4. PATTERN "Does the pain come and go, or has it been constant since it started?"  "Does it get worse with exertion?"     Constant gets worse with activity 5. DURATION: "How long does it last" (e.g., seconds, minutes, hours)     constant 6. SEVERITY: "How bad is the pain?"  (e.g., Scale 1-10; mild, moderate, or severe)    - MILD (1-3): doesn't interfere with normal activities     - MODERATE (4-7): interferes with normal activities or awakens from sleep    - SEVERE (8-10): excruciating pain, unable to do any normal activities       3 today Sunday 8 7. CARDIAC RISK FACTORS: "Do you have any history of heart problems or risk factors for heart disease?" (e.g., prior heart attack, angina; high blood pressure, diabetes, being overweight, high cholesterol, smoking, or strong family history of heart disease)   no 8. PULMONARY RISK FACTORS: "Do you have any history of lung disease?"  (e.g., blood clots in lung, asthma, emphysema, birth control pills)     no 9. CAUSE: "What do you think is causing the chest pain?"   Lifting and bending at work 10. OTHER SYMPTOMS: "Do you have any other symptoms?" (e.g., dizziness, nausea, vomiting, sweating, fever, difficulty breathing, cough)      More frequent BMs, Higher BP 134/81 today 11. PREGNANCY: "Is there any chance you are pregnant?" "When was your last menstrual period?"       N/A  Protocols used: CHEST PAIN-A-AH

## 2018-08-19 ENCOUNTER — Encounter: Payer: Self-pay | Admitting: Family Medicine

## 2018-08-23 NOTE — Progress Notes (Signed)
[facility] [place], [place], [place] [address] [phone]  Date:  08/24/2018   Name:  Kimberly Kent   DOB:  [birth year]   MRN:  [id number]  PCP:  Darreld Mclean, MD    Chief Complaint: Flank Pain (right side pain, possibly hurt while lifting at work), Leg Cramp (left leg cramp, multiple times a day), and Lab Work (would like lipid panel )   History of Present Illness:  Kimberly Kent is a 63 y.o. 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 She has noted some right side pain for 3-4 months- this seems to have started with this more physically demanding work  It is always in the same spot Not worse after eating Can be worse with taking a deep breath or moving her trunk It will come and go- worse after she is on her feet and active   This seems different from the back pain she had in the past - she thinks a separate issue She is checking her BP at home- may run 142/85, 13/80 She is not on any BP meds   BP Readings from Last 3 Encounters:  08/24/18 106/78  06/25/18 120/72  04/30/18 118/68   She has noted some pain and cramping in her left calf- in the medial proximal calf She has noted this for perhaps a month- has occurred about 5x It will come and may hurt for a whole day, then will resolve Never had a DVT or PE   She is not aware of any injury She would also like to do routine labs when possible   Lab Results  Component Value Date   TSH 1.76 04/02/2018     Patient Active Problem List   Diagnosis Date Noted  . Greater trochanteric bursitis of right hip 04/30/2018  . Polyarthralgia 04/03/2018  .  Degenerative arthritis of right knee 04/03/2018  . Lumbar spinal stenosis 04/03/2018  . PFO (patent foramen ovale) 01/13/2018  . Vitamin D deficiency 03/06/2014  . Migraine with aura 05/20/2011  . Hypothyroid 05/20/2011  . SHORTNESS OF BREATH 12/22/2009  . TRANSIENT VISUAL LOSS 07/13/2008    Past Medical History:  Diagnosis Date  . Anemia   . Anxiety   . Arthritis   . Blood transfusion without reported diagnosis   . Depression   . Fibromyalgia 12/29/2013  . Hyperplastic colon polyp   . Migraines   . Neuromuscular disorder ([place])   . Patent foramen ovale   . Thyroid disease   . Vitamin D deficiency     Past Surgical History:  Procedure Laterality Date  . ABDOMINAL HYSTERECTOMY    . KNEE ARTHROSCOPY     right knee/arthroscopic  . TONSILLECTOMY      Social History   Tobacco Use  . Smoking status: Never Smoker  . Smokeless tobacco: Never Used  Substance Use Topics  . Alcohol use: No  . Drug use: No    Family History  Problem Relation Age of Onset  . Cancer Mother   . Cancer Father   . Diabetes Father   . Parkinson's disease Maternal Grandmother   .  Cancer Maternal Grandfather     Allergies  Allergen Reactions  . Bupropion Hcl     REACTION: hives  . Codeine     REACTION: nausea and vomiting    Medication list has been reviewed and updated.  Current Outpatient Medications on File Prior to Visit  Medication Sig Dispense Refill  . cholecalciferol (VITAMIN D) 1000 UNITS tablet Take 1,000 Units by mouth daily.    Marland Kitchen FLUoxetine (PROZAC) 10 MG capsule TAKE 1 CAPSULE BY MOUTH EVERY DAY 90 capsule 1  . gabapentin (NEURONTIN) 100 MG capsule TAKE 2 CAPSULES (200 MG TOTAL) BY MOUTH AT BEDTIME. 180 capsule 2  . Glucosamine-Chondroitin 1500-1200 MG/30ML LIQD Take by mouth.    . magnesium 30 MG tablet Take 30 mg by mouth 2 (two) times daily.    . meloxicam (MOBIC) 15 MG tablet TAKE 1 TABLET BY MOUTH EVERY DAY 90 tablet 0  . SYNTHROID 75 MCG tablet TAKE 1 TABLET (75 MCG  TOTAL) BY MOUTH DAILY BEFORE BREAKFAST. 90 tablet 1  . Vitamin D, Ergocalciferol, (DRISDOL) 1.25 MG (50000 UT) CAPS capsule TAKE 1 CAPSULE (50,000 UNITS TOTAL) BY MOUTH EVERY 7 (SEVEN) DAYS. 12 capsule 0   No current facility-administered medications on file prior to visit.     Review of Systems:  As per HPI- otherwise negative. No fever or chills No particular cough No weight loss  Wt Readings from Last 3 Encounters:  08/24/18 179 lb (81.2 kg)  06/25/18 179 lb (81.2 kg)  04/30/18 184 lb (83.5 kg)     Physical Examination: Vitals:   08/24/18 1549  BP: 106/78  Pulse: 75  Resp: 16  Temp: 98.5 F (36.9 C)  SpO2: 98%   Vitals:   08/24/18 1549  Weight: 179 lb (81.2 kg)  Height: 5\' 6"  (1.676 m)   Body mass index is 28.89 kg/m. Ideal Body Weight: Weight in (lb) to have BMI = 25: 154.6  GEN: WDWN, NAD, Non-toxic, A & O x 3, overweight, looks well  HEENT: Atraumatic, Normocephalic. Neck supple. No masses, No LAD. Ears and Nose: No external deformity. CV: RRR, No M/G/R. No JVD. No thrill. No extra heart sounds. PULM: CTA B, no wheezes, crackles, rhonchi. No retractions. No resp. distress. No accessory muscle use. ABD: S, NT, ND, +BS. No rebound. No HSM.  Belly is benign- this does not seem to be an abd issue  EXTR: No c/c/e NEURO Normal gait.  PSYCH: Normally interactive. Conversant. Not depressed or anxious appearing.  Calm demeanor.  reproducible tenderness at the lower right/ posterior ribs No skin change or lesion, no rash  Calf is benign except for point tenderness in her proximal medial calf- ?muscle injury No cords or swelling Foot shows normal pulse and perfusion    Assessment and Plan:   ICD-10-CM   1. Rib pain on right side  R07.81 DG Ribs Unilateral W/Chest Right  2. Pain of left calf  M79.662 US Venous Img Lower Unilateral Left    CK  3. Screening for hyperlipidemia  Z13.220 Lipid panel  4. Screening for diabetes mellitus  Z13.1 Comprehensive metabolic  panel    Hemoglobin A1c  5. Screening for deficiency anemia  Z13.0 CBC     Follow-up: No follow-ups on file.  No orders of the defined types were placed in this encounter.  Orders Placed This Encounter  Procedures  . US Venous Img Lower Unilateral Left  . DG Ribs Unilateral W/Chest Right  . CBC  . Comprehensive metabolic panel  . CK  .  Hemoglobin A1c  . Lipid panel    Here today with a couple of concerns Right side pain- may be a rib issue Will obtain rib films Will ultrasound leg to ensure no DVT but I suspect a calf muscle injury She will be scheduled for labs asap  If any worsening she will let me know    Outpatient Encounter Medications as of 08/24/2018  Medication Sig  . cholecalciferol (VITAMIN D) 1000 UNITS tablet Take 1,000 Units by mouth daily.  Marland Kitchen FLUoxetine (PROZAC) 10 MG capsule TAKE 1 CAPSULE BY MOUTH EVERY DAY  . gabapentin (NEURONTIN) 100 MG capsule TAKE 2 CAPSULES (200 MG TOTAL) BY MOUTH AT BEDTIME.  Marland Kitchen Glucosamine-Chondroitin 1500-1200 MG/30ML LIQD Take by mouth.  . magnesium 30 MG tablet Take 30 mg by mouth 2 (two) times daily.  . meloxicam (MOBIC) 15 MG tablet TAKE 1 TABLET BY MOUTH EVERY DAY  . SYNTHROID 75 MCG tablet TAKE 1 TABLET (75 MCG TOTAL) BY MOUTH DAILY BEFORE BREAKFAST.  Marland Kitchen Vitamin D, Ergocalciferol, (DRISDOL) 1.25 MG (50000 UT) CAPS capsule TAKE 1 CAPSULE (50,000 UNITS TOTAL) BY MOUTH EVERY 7 (SEVEN) DAYS.   No facility-administered encounter medications on file as of 08/24/2018.      Signed Lamar Blinks, MD

## 2018-08-24 ENCOUNTER — Ambulatory Visit: Payer: BLUE CROSS/BLUE SHIELD | Admitting: Family Medicine

## 2018-08-24 ENCOUNTER — Other Ambulatory Visit: Payer: Self-pay

## 2018-08-24 ENCOUNTER — Encounter: Payer: Self-pay | Admitting: Family Medicine

## 2018-08-24 ENCOUNTER — Ambulatory Visit (HOSPITAL_BASED_OUTPATIENT_CLINIC_OR_DEPARTMENT_OTHER)
Admission: RE | Admit: 2018-08-24 | Discharge: 2018-08-24 | Disposition: A | Discharge status: Home / Self Care | Payer: BLUE CROSS/BLUE SHIELD | Source: Ambulatory Visit | Attending: Family Medicine | Admitting: Family Medicine

## 2018-08-24 VITALS — BP 106/78 | HR 75 | Temp 98.5°F | Resp 16 | Ht 66.0 in | Wt 179.0 lb

## 2018-08-24 DIAGNOSIS — R0781 Pleurodynia: Secondary | ICD-10-CM

## 2018-08-24 DIAGNOSIS — Z131 Encounter for screening for diabetes mellitus: Secondary | ICD-10-CM | POA: Diagnosis not present

## 2018-08-24 DIAGNOSIS — M79662 Pain in left lower leg: Secondary | ICD-10-CM

## 2018-08-24 DIAGNOSIS — Z13220 Encounter for screening for lipoid disorders: Secondary | ICD-10-CM | POA: Diagnosis not present

## 2018-08-24 DIAGNOSIS — Z130 Encounter for screening for diseases of the blood and blood-forming organs and certain disorders involving the immune mechanism: Secondary | ICD-10-CM

## 2018-08-24 NOTE — Patient Instructions (Signed)
It was great to see you today! We will get you scheduled for a lab visit asap Please stop by the imaging dept on the ground floor to have your rib films and to schedule your left calf ultrasound As soon as we get results in I will be in touch

## 2018-08-27 ENCOUNTER — Ambulatory Visit (HOSPITAL_BASED_OUTPATIENT_CLINIC_OR_DEPARTMENT_OTHER)
Admission: RE | Admit: 2018-08-27 | Discharge: 2018-08-27 | Disposition: A | Discharge status: Home / Self Care | Payer: BLUE CROSS/BLUE SHIELD | Source: Ambulatory Visit | Attending: Family Medicine | Admitting: Family Medicine

## 2018-08-27 ENCOUNTER — Other Ambulatory Visit: Payer: Self-pay

## 2018-08-27 ENCOUNTER — Encounter (HOSPITAL_BASED_OUTPATIENT_CLINIC_OR_DEPARTMENT_OTHER): Payer: Self-pay | Admitting: Radiology

## 2018-08-27 DIAGNOSIS — M79662 Pain in left lower leg: Secondary | ICD-10-CM | POA: Diagnosis not present

## 2018-08-28 ENCOUNTER — Encounter: Payer: Self-pay | Admitting: Family Medicine

## 2018-09-02 ENCOUNTER — Other Ambulatory Visit: Payer: Self-pay

## 2018-09-02 ENCOUNTER — Other Ambulatory Visit (INDEPENDENT_AMBULATORY_CARE_PROVIDER_SITE_OTHER): Payer: BLUE CROSS/BLUE SHIELD

## 2018-09-02 DIAGNOSIS — M79662 Pain in left lower leg: Secondary | ICD-10-CM

## 2018-09-02 DIAGNOSIS — Z13220 Encounter for screening for lipoid disorders: Secondary | ICD-10-CM

## 2018-09-02 DIAGNOSIS — Z130 Encounter for screening for diseases of the blood and blood-forming organs and certain disorders involving the immune mechanism: Secondary | ICD-10-CM

## 2018-09-02 DIAGNOSIS — Z131 Encounter for screening for diabetes mellitus: Secondary | ICD-10-CM | POA: Diagnosis not present

## 2018-09-03 ENCOUNTER — Encounter: Payer: Self-pay | Admitting: Family Medicine

## 2018-09-03 LAB — COMPREHENSIVE METABOLIC PANEL
ALT: 17 U/L (ref 0–35)
AST: 20 U/L (ref 0–37)
Albumin: 4.2 g/dL (ref 3.5–5.2)
Alkaline Phosphatase: 66 U/L (ref 39–117)
BUN: 23 mg/dL (ref 6–23)
CO2: 30 mEq/L (ref 19–32)
Calcium: 9.2 mg/dL (ref 8.4–10.5)
Chloride: 103 mEq/L (ref 96–112)
Creatinine, Ser: 0.79 mg/dL (ref 0.40–1.20)
GFR: 74.09 mL/min (ref >60.00)
Glucose, Bld: 109 mg/dL — ABNORMAL HIGH (ref 70–99)
Potassium: 4.2 mEq/L (ref 3.5–5.1)
Sodium: 141 mEq/L (ref 135–145)
Total Bilirubin: 0.6 mg/dL (ref 0.2–1.2)
Total Protein: 6.3 g/dL (ref 6.0–8.3)

## 2018-09-03 LAB — CBC
HCT: 36.7 % (ref 36.0–46.0)
Hemoglobin: 12.3 g/dL (ref 12.0–15.0)
MCHC: 33.4 g/dL (ref 30.0–36.0)
MCV: 96 fl (ref 78.0–100.0)
Platelets: 260 10*3/uL (ref 150.0–400.0)
RBC: 3.83 Mil/uL — ABNORMAL LOW (ref 3.87–5.11)
RDW: 12.3 % (ref 11.5–15.5)
WBC: 5.9 10*3/uL (ref 4.0–10.5)

## 2018-09-03 LAB — CK: Total CK: 107 U/L (ref 7–177)

## 2018-09-03 LAB — LIPID PANEL
Cholesterol: 225 mg/dL — ABNORMAL HIGH (ref 0–200)
HDL: 73 mg/dL (ref >39.00)
LDL Cholesterol: 127 mg/dL — ABNORMAL HIGH (ref 0–99)
NonHDL: 151.87
Total CHOL/HDL Ratio: 3
Triglycerides: 124 mg/dL (ref 0.0–149.0)
VLDL: 24.8 mg/dL (ref 0.0–40.0)

## 2018-09-03 LAB — HEMOGLOBIN A1C: Hgb A1c MFr Bld: 5.4 % (ref 4.6–6.5)

## 2018-09-03 NOTE — Progress Notes (Signed)
Received labs from recent visit-see note from 6/29  Results for orders placed or performed in visit on 09/02/18  Lipid panel  Result Value Ref Range   Cholesterol 225 (H) 0 - 200 mg/dL   Triglycerides 124.0 0.0 - 149.0 mg/dL   HDL 73.00 >39.00 mg/dL   VLDL 24.8 0.0 - 40.0 mg/dL   LDL Cholesterol 127 (H) 0 - 99 mg/dL   Total CHOL/HDL Ratio 3    NonHDL 151.87   Hemoglobin A1c  Result Value Ref Range   Hgb A1c MFr Bld 5.4 4.6 - 6.5 %  CK  Result Value Ref Range   Total CK 107 7 - 177 U/L  Comprehensive metabolic panel  Result Value Ref Range   Sodium 141 135 - 145 mEq/L   Potassium 4.2 3.5 - 5.1 mEq/L   Chloride 103 96 - 112 mEq/L   CO2 30 19 - 32 mEq/L   Glucose, Bld 109 (H) 70 - 99 mg/dL   BUN 23 6 - 23 mg/dL   Creatinine, Ser 0.79 0.40 - 1.20 mg/dL   Total Bilirubin 0.6 0.2 - 1.2 mg/dL   Alkaline Phosphatase 66 39 - 117 U/L   AST 20 0 - 37 U/L   ALT 17 0 - 35 U/L   Total Protein 6.3 6.0 - 8.3 g/dL   Albumin 4.2 3.5 - 5.2 g/dL   Calcium 9.2 8.4 - 10.5 mg/dL   GFR 74.09 >60.00 mL/min  CBC  Result Value Ref Range   WBC 5.9 4.0 - 10.5 K/uL   RBC 3.83 (L) 3.87 - 5.11 Mil/uL   Platelets 260.0 150.0 - 400.0 K/uL   Hemoglobin 12.3 12.0 - 15.0 g/dL   HCT 36.7 36.0 - 46.0 %   MCV 96.0 78.0 - 100.0 fl   MCHC 33.4 30.0 - 36.0 g/dL   RDW 12.3 11.5 - 15.5 %

## 2018-09-05 ENCOUNTER — Other Ambulatory Visit: Payer: Self-pay | Admitting: Family Medicine

## 2018-09-07 ENCOUNTER — Other Ambulatory Visit: Payer: Self-pay | Admitting: Family Medicine

## 2018-09-07 DIAGNOSIS — F428 Other obsessive-compulsive disorder: Secondary | ICD-10-CM

## 2018-09-07 DIAGNOSIS — L821 Other seborrheic keratosis: Secondary | ICD-10-CM | POA: Diagnosis not present

## 2018-09-07 DIAGNOSIS — Z85828 Personal history of other malignant neoplasm of skin: Secondary | ICD-10-CM | POA: Diagnosis not present

## 2018-09-17 ENCOUNTER — Other Ambulatory Visit: Payer: Self-pay | Admitting: Family Medicine

## 2018-10-09 ENCOUNTER — Encounter: Payer: Self-pay | Admitting: Family Medicine

## 2018-10-10 ENCOUNTER — Encounter: Payer: Self-pay | Admitting: Family Medicine

## 2018-10-11 ENCOUNTER — Encounter: Payer: Self-pay | Admitting: Family Medicine

## 2018-10-16 ENCOUNTER — Other Ambulatory Visit: Payer: Self-pay | Admitting: Family Medicine

## 2018-10-20 ENCOUNTER — Encounter: Payer: Self-pay | Admitting: Family Medicine

## 2018-11-04 ENCOUNTER — Encounter: Payer: Self-pay | Admitting: Family Medicine

## 2018-11-04 DIAGNOSIS — E039 Hypothyroidism, unspecified: Secondary | ICD-10-CM

## 2018-11-05 MED ORDER — SYNTHROID 75 MCG PO TABS: 75.0000 ug | ORAL_TABLET | Freq: Every day | ORAL | 1 refills | Status: DC

## 2018-11-29 ENCOUNTER — Other Ambulatory Visit: Payer: Self-pay | Admitting: Family Medicine

## 2018-12-21 ENCOUNTER — Other Ambulatory Visit: Payer: Self-pay | Admitting: Family Medicine

## 2019-02-09 NOTE — Progress Notes (Addendum)
[facility] [place], [place], [place] [address] [phone]  Date:  02/10/2019   Name:  Kimberly Kent   DOB:  [birth year]   MRN:  [id number]  PCP:  Darreld Mclean, MD    Chief Complaint: Rib Pain (right side x4 months getting worse to touch) and Nail Problem (right foot toe discloration )   History of Present Illness:  Kimberly Kent is a 63 y.o. very pleasant female patient who presents with the following:  Here today with concern of rib pain on the left side Patient with history of PFO, spinal stenosis, hypothyroidism  She noted right flank pain for the last 4 months or so- getting worse  It is painful to the touch  Her back feels a bit stiff, it can be painful to move her trunk as well No CP or SOB  Worse when she stands for a long period of time or carries heavy trays at her work She did fall on her hip about 3 years ago, but otherwise no injury that she can recall  No hematuria or other urinary sx  She works at [employer] part time.    Also, she has noted some thickening of two of her right toenails, she thinks it may be a fungal infection  She sees Dr Gita Kudo for her pap and mammo- she is UTD per her knowledge Flu shot done already  She is taking fluoxetine, gabapentin at night, and mobic daily - 15 mg- that she has been getting per Dr Tamala Julian with sports med.   BP Readings from Last 3 Encounters:  02/10/19 112/75  08/24/18 106/78  06/25/18 120/72      Patient Active Problem List   Diagnosis Date Noted  . Greater trochanteric bursitis of right hip 04/30/2018  . Polyarthralgia 04/03/2018  . Degenerative arthritis of right knee 04/03/2018  . Lumbar spinal stenosis 04/03/2018  . PFO (patent foramen ovale) 01/13/2018  . Vitamin D deficiency 03/06/2014  . Migraine with aura 05/20/2011  . Hypothyroid 05/20/2011  . SHORTNESS OF BREATH 12/22/2009  . TRANSIENT VISUAL LOSS 07/13/2008    Past Medical  History:  Diagnosis Date  . Anemia   . Anxiety   . Arthritis   . Blood transfusion without reported diagnosis   . Depression   . Fibromyalgia 12/29/2013  . Hyperplastic colon polyp   . Migraines   . Neuromuscular disorder ([place])   . Patent foramen ovale   . Thyroid disease   . Vitamin D deficiency     Past Surgical History:  Procedure Laterality Date  . ABDOMINAL HYSTERECTOMY    . ENDOVENOUS ABLATION SAPHENOUS VEIN W/ LASER Left 2010   Approx 2010 per patient, Has LLE EVLT at [facility] in [place], had a reaction to Tumescent??? given during the procedure, could not follow through with procedure on Right leg.  Marland Kitchen KNEE ARTHROSCOPY     right knee/arthroscopic  . TONSILLECTOMY      Social History   Tobacco Use  . Smoking status: Never Smoker  . Smokeless tobacco: Never Used  Substance Use Topics  . Alcohol use: No  . Drug use: No    Family History  Problem Relation Age of Onset  . Cancer Mother   . Cancer Father   . Diabetes Father   . Parkinson's disease Maternal Grandmother   . Cancer Maternal Grandfather     Allergies  Allergen Reactions  . Bupropion Hcl  REACTION: hives  . Codeine     REACTION: nausea and vomiting    Medication list has been reviewed and updated.  Current Outpatient Medications on File Prior to Visit  Medication Sig Dispense Refill  . FLUoxetine (PROZAC) 10 MG capsule TAKE 1 CAPSULE BY MOUTH EVERY DAY 90 capsule 1  . gabapentin (NEURONTIN) 100 MG capsule TAKE 2 CAPSULES (200 MG TOTAL) BY MOUTH AT BEDTIME. 180 capsule 2   No current facility-administered medications on file prior to visit.    Review of Systems:  As per HPI- otherwise negative. No fever or chills No cough   Physical Examination: Vitals:   02/10/19 1404 02/10/19 1425  BP: (!) 145/70 112/75  Pulse: 64   Resp: 18   Temp: (!) 96.1 F (35.6 C)   SpO2: 99%    Vitals:   02/10/19 1404  Weight: 189 lb 3.2 oz (85.8 kg)  Height: 5\' 6"  (1.676 m)   Body mass  index is 30.54 kg/m. Ideal Body Weight: Weight in (lb) to have BMI = 25: 154.6  GEN: WDWN, NAD, Non-toxic, A & O x 3, overweight, looks well  HEENT: Atraumatic, Normocephalic. Neck supple. No masses, No LAD. Ears and Nose: No external deformity. CV: RRR, No M/G/R. No JVD. No thrill. No extra heart sounds. PULM: CTA B, no wheezes, crackles, rhonchi. No retractions. No resp. distress. No accessory muscle use. ABD: S, NT, ND, +BS. No rebound. No HSM. EXTR: No c/c/e NEURO Normal gait.  PSYCH: Normally interactive. Conversant. Not depressed or anxious appearing.  Calm demeanor.  Reproducible tenderness in left flank/ over left lower lateral/ posterior ribs. No skin changes are noted Belly is benign Left foot displays thickening and growth under nail c/w onychomycosis    Assessment and Plan: Onychomycosis - Plan: terbinafine (LAMISIL) 250 MG tablet  Rib pain - Plan: DG Ribs Unilateral W/Chest Right  Here today with concern of rib pain- persistent for 4 months Will check rib films today- will be in touch with her asap lamisil daily for 12 weeks for onychomycosis Recent labs on chart already   Signed Lamar Blinks, MD  Received her rib films, message to patient  DG Ribs Unilateral W/Chest Right  Result Date: 02/10/2019 CLINICAL DATA:  Chronic progressive posterior right rib pain for several months. EXAM: RIGHT RIBS AND CHEST - 3+ VIEW COMPARISON:  Chest x-ray and rib radiographs dated 08/24/2018 FINDINGS: No fracture or other bone lesions are seen involving the ribs. There is no evidence of pneumothorax or pleural effusion. Both lungs are clear. Heart size and mediastinal contours are within normal limits. IMPRESSION: Normal exam. Electronically Signed   By: Lorriane Shire M.D.   On: 02/10/2019 15:11

## 2019-02-10 ENCOUNTER — Other Ambulatory Visit: Payer: Self-pay

## 2019-02-10 ENCOUNTER — Encounter: Payer: Self-pay | Admitting: Family Medicine

## 2019-02-10 ENCOUNTER — Ambulatory Visit (INDEPENDENT_AMBULATORY_CARE_PROVIDER_SITE_OTHER): Payer: BLUE CROSS/BLUE SHIELD | Admitting: Family Medicine

## 2019-02-10 ENCOUNTER — Ambulatory Visit (HOSPITAL_BASED_OUTPATIENT_CLINIC_OR_DEPARTMENT_OTHER)
Admission: RE | Admit: 2019-02-10 | Discharge: 2019-02-10 | Disposition: A | Discharge status: Home / Self Care | Payer: BLUE CROSS/BLUE SHIELD | Source: Ambulatory Visit | Attending: Family Medicine | Admitting: Family Medicine

## 2019-02-10 VITALS — BP 112/75 | HR 64 | Temp 96.1°F | Resp 18 | Ht 66.0 in | Wt 189.2 lb

## 2019-02-10 DIAGNOSIS — B351 Tinea unguium: Secondary | ICD-10-CM

## 2019-02-10 DIAGNOSIS — R0781 Pleurodynia: Secondary | ICD-10-CM

## 2019-02-10 MED ORDER — TERBINAFINE HCL 250 MG PO TABS: 250.0000 mg | ORAL_TABLET | Freq: Every day | ORAL | 0 refills | Status: DC

## 2019-02-10 NOTE — Patient Instructions (Signed)
Good to see you again today!  Please go to the ground floor for x-rays of your ribs I will be in touch with your x-rays asap, we will also treat you for fungal toenails with terbinafine 250 daily for 12 weeks

## 2019-02-11 MED ORDER — DICLOFENAC SODIUM 75 MG PO TBEC: 75.0000 mg | DELAYED_RELEASE_TABLET | Freq: Two times a day (BID) | ORAL | 0 refills | Status: DC

## 2019-02-26 ENCOUNTER — Encounter: Payer: Self-pay | Admitting: Family Medicine

## 2019-02-28 NOTE — Progress Notes (Addendum)
[facility] [place], [place], [place] [address] [phone]  Date:  03/03/2019   Name:  Kimberly Kent   DOB:  08-17-1955   MRN:  [id number]  PCP:  Darreld Mclean, MD    Chief Complaint: Headache and Medication Management   History of Present Illness:  Kimberly Kent is a 65 y.o. very pleasant female patient who presents with the following:  Kimberly Kent is a very nice, generally healthy woman with history of arthritis, lumbar stenosis, hypothyroidism, PFO We saw each other about 3 weeks ago with concern of rib pain/right flank pain for about 4 months Plain rib films were unrevealing We tried voltaren by mouth but this caused headaches so she had to stop taking it,  The rib pain is still present, not really getting better  She contacted me a few days ago with concern of feeling fatigued, we had her come in for follow-up  Mammogram and Pap per her GYN provider Can mention Shingrix  DG Ribs Unilateral W/Chest Right  Result Date: 02/10/2019 CLINICAL DATA:  Chronic progressive posterior right rib pain for several months. EXAM: RIGHT RIBS AND CHEST - 3+ VIEW COMPARISON:  Chest x-ray and rib radiographs dated 08/24/2018 FINDINGS: No fracture or other bone lesions are seen involving the ribs. There is no evidence of pneumothorax or pleural effusion. Both lungs are clear. Heart size and mediastinal contours are within normal limits. IMPRESSION: Normal exam. Electronically Signed   By: Lorriane Shire M.D.   On: 02/10/2019 15:11   She notes that years ago she suffered some fatigue- she was found to have low blood sugars maybe 30 years ago.  However it is not clear if they were actually checking her blood sugar at that time, or if she was just told that she probably had hypoglycemia  She has noted several episodes of severe fatigue/ somnolence over the last month or so-this is a new issue for her She may may fall asleep while watching  TV after dinner but not always Her hairdresser noted that she started not off during her recent appointment She sleeps about 7 hours a night- goes to sleep easily and sleeps all night as far as she knows  In the morning she still feels tired She is not aware that she snores or stops breathing during the night  She often feels hungry, but not shaky Not sweaty or clammy Admits that she has been eating more carbohydrates during the holiday season, wonders if this could be contributing to her symptoms  Never had a sleep study  Patient Active Problem List   Diagnosis Date Noted  . Greater trochanteric bursitis of right hip 04/30/2018  . Polyarthralgia 04/03/2018  . Degenerative arthritis of right knee 04/03/2018  . Lumbar spinal stenosis 04/03/2018  . PFO (patent foramen ovale) 01/13/2018  . Vitamin D deficiency 03/06/2014  . Migraine with aura 05/20/2011  . Hypothyroid 05/20/2011  . SHORTNESS OF BREATH 12/22/2009  . TRANSIENT VISUAL LOSS 07/13/2008    Past Medical History:  Diagnosis Date  . Anemia   . Anxiety   . Arthritis   . Blood transfusion without reported diagnosis   . Depression   . Fibromyalgia 12/29/2013  . Hyperplastic colon polyp   . Migraines   . Neuromuscular disorder ([place])   . Patent foramen ovale   . Thyroid disease   . Vitamin D deficiency     Past Surgical History:  Procedure  Laterality Date  . ABDOMINAL HYSTERECTOMY    . ENDOVENOUS ABLATION SAPHENOUS VEIN W/ LASER Left 2010   Approx 2010 per patient, Has LLE EVLT at [facility] in [place], had a reaction to Tumescent??? given during the procedure, could not follow through with procedure on Right leg.  Marland Kitchen KNEE ARTHROSCOPY     right knee/arthroscopic  . TONSILLECTOMY      Social History   Tobacco Use  . Smoking status: Never Smoker  . Smokeless tobacco: Never Used  Substance Use Topics  . Alcohol use: No  . Drug use: No    Family History  Problem Relation Age of Onset  . Cancer Mother    . Cancer Father   . Diabetes Father   . Parkinson's disease Maternal Grandmother   . Cancer Maternal Grandfather     Allergies  Allergen Reactions  . Bupropion Hcl     REACTION: hives  . Codeine     REACTION: nausea and vomiting    Medication list has been reviewed and updated.  Current Outpatient Medications on File Prior to Visit  Medication Sig Dispense Refill  . diclofenac (VOLTAREN) 75 MG EC tablet Take 1 tablet (75 mg total) by mouth 2 (two) times daily. Use as needed for rib pain 40 tablet 0  . FLUoxetine (PROZAC) 10 MG capsule TAKE 1 CAPSULE BY MOUTH EVERY DAY 90 capsule 1  . gabapentin (NEURONTIN) 100 MG capsule TAKE 2 CAPSULES (200 MG TOTAL) BY MOUTH AT BEDTIME. 180 capsule 2   No current facility-administered medications on file prior to visit.    Review of Systems:  As per HPI- otherwise negative. No fever or chills  Physical Examination: Vitals:   03/03/19 1623  BP: 130/80  Pulse: 67  Resp: 17  Temp: (!) 96.6 F (35.9 C)  SpO2: 98%   Vitals:   03/03/19 1623  Weight: 187 lb (84.8 kg)  Height: 5\' 6"  (1.676 m)   Body mass index is 30.18 kg/m. Ideal Body Weight: Weight in (lb) to have BMI = 25: 154.6  GEN: WDWN, NAD, Non-toxic, A & O x 3, overweight, looks well HEENT: Atraumatic, Normocephalic. Neck supple. No masses, No LAD. Ears and Nose: No external deformity. CV: RRR, No M/G/R. No JVD. No thrill. No extra heart sounds. PULM: CTA B, no wheezes, crackles, rhonchi. No retractions. No resp. distress. No accessory muscle use. ABD: S, NT, ND, +BS. No rebound. No HSM. EXTR: No c/c/e NEURO Normal gait.  PSYCH: Normally interactive. Conversant. Not depressed or anxious appearing.  Calm demeanor.  Continues to have tenderness in the right lower posterior and lateral ribs No skin findings Abdomen is benign No masses   Assessment and Plan: Fatigue, unspecified type - Plan: CBC, TSH, Vitamin D (25 hydroxy)  History of hypoglycemia - Plan:  Comprehensive metabolic panel, Hemoglobin A1c  Rib pain - Plan: Ambulatory referral to Orthopedic Surgery  Onychomycosis - Plan: terbinafine (LAMISIL) 250 MG tablet  Here today with a couple of concerns.  She continues to have rib pain, has been present for about 6 months now.  Plain films were negative.  I will refer her to orthopedics for further evaluation, she may need an MRI Send her oral Lamisil to a different pharmacy for price advantage Fatigue-she may have sleep apnea.  Labs pending as above.  Of note, she did donate blood 2 weeks ago Encouraged her to consider getting a home glucose monitor, she might check her sugar when she is feeling bad to get an idea  where she is running Will plan further follow- up pending labs.  This visit occurred during the SARS-CoV-2 public health emergency.  Safety protocols were in place, including screening questions prior to the visit, additional usage of staff PPE, and extensive cleaning of exam room while observing appropriate contact time as indicated for disinfecting solutions.    Signed Lamar Blinks, MD  Received her labs 1/7, message to patient  Results for orders placed or performed in visit on 03/03/19  CBC  Result Value Ref Range   WBC 5.1 4.0 - 10.5 K/uL   RBC 3.62 (L) 3.87 - 5.11 Mil/uL   Platelets 296.0 150.0 - 400.0 K/uL   Hemoglobin 11.5 (L) 12.0 - 15.0 g/dL   HCT 33.8 (L) 36.0 - 46.0 %   MCV 93.5 78.0 - 100.0 fl   MCHC 34.0 30.0 - 36.0 g/dL   RDW 12.4 11.5 - 15.5 %  Comprehensive metabolic panel  Result Value Ref Range   Sodium 141 135 - 145 mEq/L   Potassium 4.2 3.5 - 5.1 mEq/L   Chloride 105 96 - 112 mEq/L   CO2 30 19 - 32 mEq/L   Glucose, Bld 95 70 - 99 mg/dL   BUN 13 6 - 23 mg/dL   Creatinine, Ser 0.62 0.40 - 1.20 mg/dL   Total Bilirubin 0.4 0.2 - 1.2 mg/dL   Alkaline Phosphatase 57 39 - 117 U/L   AST 21 0 - 37 U/L   ALT 18 0 - 35 U/L   Total Protein 6.5 6.0 - 8.3 g/dL   Albumin 4.1 3.5 - 5.2 g/dL   GFR 97.83  >60.00 mL/min   Calcium 9.4 8.4 - 10.5 mg/dL  Hemoglobin A1c  Result Value Ref Range   Hgb A1c MFr Bld 5.2 4.6 - 6.5 %  TSH  Result Value Ref Range   TSH 1.29 0.35 - 4.50 uIU/mL  Vitamin D (25 hydroxy)  Result Value Ref Range   VITD 18.18 (L) 30.00 - 100.00 ng/mL     Metabolic profile is normal A1c-average blood sugar the previous 3 months-is normal Thyroid normal You are minimally anemic, but this is probably due to donating blood recently.  This level of anemia should not cause symptoms typically  Your vitamin D is low, I am going to prescribe a high-dose WEEKLY supplement for you to take for 12 weeks.  When you finish that, please continue a daily supplement of 2000 international units daily  It is possible that low vitamin D may be causing your fatigue.  Please let me know how you feel after restart the supplement  I apologize for not communicating about your labs, the system has started releasing labs to patient's immediately -sometimes before I have had a chance to look at them and make comments

## 2019-03-02 ENCOUNTER — Telehealth: Payer: Self-pay | Admitting: *Deleted

## 2019-03-02 NOTE — Patient Instructions (Addendum)
It was nice to see you again today, but I am sorry you are still having this pain We will get you set up with orthopedics to see if they can determine the cause  I will be in touch with your labs ASAP.  In the meantime, I would suggest eating small, more frequent meals.  Be sure you include protein and fat with each meal to help avoid blood sugar fluctuations.  If you like, you can purchase an over-the-counter blood glucose kit and check your sugar on occasion.  Typically fasting blood sugar will be about 70-120.  After eating your blood sugar may be higher, up to 200 or so.  If you are running significantly out of these ranges, please let me know  If your blood sugar and labs are normal, we may want to pursue a sleep study

## 2019-03-02 NOTE — Telephone Encounter (Signed)
A message was left, re: her follow up visit. 

## 2019-03-03 ENCOUNTER — Ambulatory Visit (INDEPENDENT_AMBULATORY_CARE_PROVIDER_SITE_OTHER): Payer: BLUE CROSS/BLUE SHIELD | Admitting: Family Medicine

## 2019-03-03 ENCOUNTER — Encounter: Payer: Self-pay | Admitting: Family Medicine

## 2019-03-03 ENCOUNTER — Other Ambulatory Visit: Payer: Self-pay

## 2019-03-03 ENCOUNTER — Ambulatory Visit: Payer: BLUE CROSS/BLUE SHIELD | Admitting: Family Medicine

## 2019-03-03 VITALS — BP 130/80 | HR 67 | Temp 96.6°F | Resp 17 | Ht 66.0 in | Wt 187.0 lb

## 2019-03-03 DIAGNOSIS — Z8639 Personal history of other endocrine, nutritional and metabolic disease: Secondary | ICD-10-CM

## 2019-03-03 DIAGNOSIS — B351 Tinea unguium: Secondary | ICD-10-CM | POA: Diagnosis not present

## 2019-03-03 DIAGNOSIS — E559 Vitamin D deficiency, unspecified: Secondary | ICD-10-CM

## 2019-03-03 DIAGNOSIS — R5383 Other fatigue: Secondary | ICD-10-CM

## 2019-03-03 DIAGNOSIS — R0781 Pleurodynia: Secondary | ICD-10-CM

## 2019-03-03 MED ORDER — TERBINAFINE HCL 250 MG PO TABS: 250.0000 mg | ORAL_TABLET | Freq: Every day | ORAL | 0 refills | Status: DC

## 2019-03-04 ENCOUNTER — Ambulatory Visit: Payer: BLUE CROSS/BLUE SHIELD | Admitting: Family Medicine

## 2019-03-04 ENCOUNTER — Encounter: Payer: Self-pay | Admitting: Family Medicine

## 2019-03-04 LAB — COMPREHENSIVE METABOLIC PANEL
ALT: 18 U/L (ref 0–35)
AST: 21 U/L (ref 0–37)
Albumin: 4.1 g/dL (ref 3.5–5.2)
Alkaline Phosphatase: 57 U/L (ref 39–117)
BUN: 13 mg/dL (ref 6–23)
CO2: 30 mEq/L (ref 19–32)
Calcium: 9.4 mg/dL (ref 8.4–10.5)
Chloride: 105 mEq/L (ref 96–112)
Creatinine, Ser: 0.62 mg/dL (ref 0.40–1.20)
GFR: 97.83 mL/min (ref >60.00)
Glucose, Bld: 95 mg/dL (ref 70–99)
Potassium: 4.2 mEq/L (ref 3.5–5.1)
Sodium: 141 mEq/L (ref 135–145)
Total Bilirubin: 0.4 mg/dL (ref 0.2–1.2)
Total Protein: 6.5 g/dL (ref 6.0–8.3)

## 2019-03-04 LAB — CBC
HCT: 33.8 % — ABNORMAL LOW (ref 36.0–46.0)
Hemoglobin: 11.5 g/dL — ABNORMAL LOW (ref 12.0–15.0)
MCHC: 34 g/dL (ref 30.0–36.0)
MCV: 93.5 fl (ref 78.0–100.0)
Platelets: 296 10*3/uL (ref 150.0–400.0)
RBC: 3.62 Mil/uL — ABNORMAL LOW (ref 3.87–5.11)
RDW: 12.4 % (ref 11.5–15.5)
WBC: 5.1 10*3/uL (ref 4.0–10.5)

## 2019-03-04 LAB — HEMOGLOBIN A1C: Hgb A1c MFr Bld: 5.2 % (ref 4.6–6.5)

## 2019-03-04 LAB — VITAMIN D 25 HYDROXY (VIT D DEFICIENCY, FRACTURES): VITD: 18.18 ng/mL — ABNORMAL LOW (ref 30.00–100.00)

## 2019-03-04 LAB — TSH: TSH: 1.29 u[IU]/mL (ref 0.35–4.50)

## 2019-03-04 MED ORDER — VITAMIN D3 1.25 MG (50000 UT) PO CAPS: ORAL_CAPSULE | ORAL | 0 refills | Status: DC

## 2019-03-04 NOTE — Addendum Note (Signed)
Addended by: Lamar Blinks C on: 03/04/2019 08:05 PM   Modules accepted: Orders

## 2019-03-16 ENCOUNTER — Other Ambulatory Visit: Payer: Self-pay | Admitting: Family Medicine

## 2019-03-17 DIAGNOSIS — L820 Inflamed seborrheic keratosis: Secondary | ICD-10-CM | POA: Diagnosis not present

## 2019-03-24 ENCOUNTER — Other Ambulatory Visit: Payer: Self-pay | Admitting: Family Medicine

## 2019-03-24 DIAGNOSIS — F428 Other obsessive-compulsive disorder: Secondary | ICD-10-CM

## 2019-03-31 ENCOUNTER — Ambulatory Visit: Payer: BLUE CROSS/BLUE SHIELD | Attending: Internal Medicine

## 2019-03-31 DIAGNOSIS — Z20822 Contact with and (suspected) exposure to covid-19: Secondary | ICD-10-CM

## 2019-04-01 LAB — NOVEL CORONAVIRUS, NAA: SARS-CoV-2, NAA: NOT DETECTED

## 2019-04-07 DIAGNOSIS — Z1231 Encounter for screening mammogram for malignant neoplasm of breast: Secondary | ICD-10-CM | POA: Diagnosis not present

## 2019-04-30 ENCOUNTER — Encounter: Payer: Self-pay | Admitting: Cardiology

## 2019-05-09 ENCOUNTER — Other Ambulatory Visit: Payer: Self-pay | Admitting: Family Medicine

## 2019-05-09 DIAGNOSIS — E039 Hypothyroidism, unspecified: Secondary | ICD-10-CM

## 2019-05-12 ENCOUNTER — Encounter: Payer: Self-pay | Admitting: Family Medicine

## 2019-05-16 ENCOUNTER — Encounter: Payer: Self-pay | Admitting: Family Medicine

## 2019-05-20 ENCOUNTER — Ambulatory Visit (INDEPENDENT_AMBULATORY_CARE_PROVIDER_SITE_OTHER): Payer: BLUE CROSS/BLUE SHIELD | Admitting: Family Medicine

## 2019-05-20 ENCOUNTER — Encounter: Payer: Self-pay | Admitting: Family Medicine

## 2019-05-20 ENCOUNTER — Other Ambulatory Visit: Payer: Self-pay

## 2019-05-20 VITALS — BP 131/75 | HR 70 | Temp 97.1°F | Resp 16 | Ht 66.5 in | Wt 181.0 lb

## 2019-05-20 DIAGNOSIS — R1011 Right upper quadrant pain: Secondary | ICD-10-CM

## 2019-05-20 DIAGNOSIS — B351 Tinea unguium: Secondary | ICD-10-CM

## 2019-05-20 DIAGNOSIS — R109 Unspecified abdominal pain: Secondary | ICD-10-CM | POA: Diagnosis not present

## 2019-05-20 MED ORDER — TERBINAFINE HCL 250 MG PO TABS: 250.0000 mg | ORAL_TABLET | Freq: Every day | ORAL | 0 refills | Status: DC

## 2019-05-20 NOTE — Patient Instructions (Signed)
Good to see you again- we will work on getting to the bottom of this rib pain  Please go to lab, then to the imaging dept on the ground floor to schedule your ultrasound.  I will be in touch with these reports If anything gets acutely worse please go to the ER or contact us for advice!

## 2019-05-20 NOTE — Progress Notes (Addendum)
[facility] at [facility] [address], [place], [place] [address] [phone]  Date:  05/20/2019   Name:  Kimberly Kent   DOB:  02-13-1956   MRN:  [id number]  PCP:  Darreld Mclean, MD    Chief Complaint: No chief complaint on file.   History of Present Illness:  Kimberly Kent is a 64 y.o. very pleasant female patient who presents with the following:  Here today for follow-up visit Patient with history of migraine headache, hypothyroidism, PFO, polyarthralgia/arthritis/lumbar stenosis Last seen by myself in January with concern of fatigue and persistent rib pain At that time I referred her to orthopedics for her rib pain issue, and got blood work for [place] did not end up being seen by orthopedics as her symptoms seem to be improving Her labs showed low vitamin D, and mild anemia thought due to recent blood donation  She recently contacted [place] on March 21 with concern of recurrent/increased increased rib pain, she was interested in a CT scan Over the last week she has noted more gas and stomach discomfort No vomiting Eating does seem to make her worse She is trying to eat a light diet- thinks she has lost a few lbs intentionally Not having heart burn No diarrhea No nighttime reflux sx  So far we have done plain rib films in December, unrevealing  Mammogram- recently completed per [facility]  Colon cancer screening- she just got a reminder letter  Pap smear-patient is status post hysterectomy, get details- this was done in the 29s.  No cancer-hysterectomy done due to excessive bleeding.  She sees Dr Gita Kudo for her well woman care.  She still has her ovaries  She was able to get her covid vaccine- her first dose about 4 days ago, second dose is scheduled  She is taking 1 gabapentin at bedtime   Wt Readings from Last 3 Encounters:  05/20/19 181 lb (82.1 kg)  03/03/19 187 lb (84.8 kg)  02/10/19 189 lb 3.2 oz (85.8 kg)   She is  intentionally losing weight  Patient Active Problem List   Diagnosis Date Noted  . Greater trochanteric bursitis of right hip 04/30/2018  . Polyarthralgia 04/03/2018  . Degenerative arthritis of right knee 04/03/2018  . Lumbar spinal stenosis 04/03/2018  . PFO (patent foramen ovale) 01/13/2018  . Vitamin D deficiency 03/06/2014  . Migraine with aura 05/20/2011  . Hypothyroid 05/20/2011  . SHORTNESS OF BREATH 12/22/2009  . TRANSIENT VISUAL LOSS 07/13/2008    Past Medical History:  Diagnosis Date  . Anemia   . Anxiety   . Arthritis   . Blood transfusion without reported diagnosis   . Depression   . Fibromyalgia 12/29/2013  . Hyperplastic colon polyp   . Migraines   . Neuromuscular disorder ([place])   . Patent foramen ovale   . Thyroid disease   . Vitamin D deficiency     Past Surgical History:  Procedure Laterality Date  . ABDOMINAL HYSTERECTOMY    . ENDOVENOUS ABLATION SAPHENOUS VEIN W/ LASER Left 2010   Approx 2010 per patient, Has LLE EVLT at [facility] in [place], had a reaction to Tumescent??? given during the procedure, could not follow through with procedure on Right leg.  Kimberly Kent KNEE ARTHROSCOPY     right knee/arthroscopic  . TONSILLECTOMY      Social History   Tobacco Use  . Smoking status: Never Smoker  . Smokeless tobacco: Never Used  Substance  Use Topics  . Alcohol use: No  . Drug use: No    Family History  Problem Relation Age of Onset  . Cancer Mother   . Cancer Father   . Diabetes Father   . Parkinson's disease Maternal Grandmother   . Cancer Maternal Grandfather     Allergies  Allergen Reactions  . Bupropion Hcl     REACTION: hives  . Codeine     REACTION: nausea and vomiting    Medication list has been reviewed and updated.  Current Outpatient Medications on File Prior to Visit  Medication Sig Dispense Refill  . Cholecalciferol (VITAMIN D3) 1.25 MG (50000 UT) CAPS Take 1 weekly for 12 weeks 12 capsule 0  . diclofenac (VOLTAREN) 75  MG EC tablet Take 1 tablet (75 mg total) by mouth 2 (two) times daily. Use as needed for rib pain 40 tablet 0  . FLUoxetine (PROZAC) 10 MG capsule TAKE 1 CAPSULE BY MOUTH EVERY DAY 90 capsule 1  . gabapentin (NEURONTIN) 100 MG capsule TAKE 2 CAPSULES (200 MG TOTAL) BY MOUTH AT BEDTIME. 180 capsule 2  . meloxicam (MOBIC) 15 MG tablet TAKE 1 TABLET BY MOUTH EVERY DAY 90 tablet 0  . SYNTHROID 75 MCG tablet TAKE 1 TABLET (75 MCG TOTAL) BY MOUTH DAILY BEFORE BREAKFAST. 90 tablet 1   No current facility-administered medications on file prior to visit.    Review of Systems:  As per HPI- otherwise negative.   Physical Examination: Vitals:   05/20/19 1551 05/20/19 1607  BP: 131/75   Pulse: 100 70  Resp: 16   Temp: (!) 97.1 F (36.2 C)    Vitals:   05/20/19 1551  Weight: 181 lb (82.1 kg)  Height: 5' 6.5" (1.689 m)   Body mass index is 28.78 kg/m. Ideal Body Weight: Weight in (lb) to have BMI = 25: 156.9  GEN: no acute distress.  Minimal overweight, looks well HEENT: Atraumatic, Normocephalic.   Bilateral TM wnl, oropharynx normal.  PEERL,EOMI.   Ears and Nose: No external deformity. CV: RRR, No M/G/R. No JVD. No thrill. No extra heart sounds. PULM: CTA B, no wheezes, crackles, rhonchi. No retractions. No resp. distress. No accessory muscle use. ABD: S, NT, ND, +BS. No rebound. No HSM.  She notes some discomfort over the right upper quadrant, mild.  Negative Murphy sign She also has some discomfort in the same dermatome over her lateral and posterior ribs and abdominal musculature EXTR: No c/c/e PSYCH: Normally interactive. Conversant.    Assessment and Plan: RUQ pain - Plan: US Abdomen Limited RUQ  Right flank pain - Plan: CBC, Comprehensive metabolic panel, US Abdomen Limited RUQ  Onychomycosis - Plan: terbinafine (LAMISIL) 250 MG tablet  Here today to follow-up on flank pain/possible rib pain We did plain films for her last year which were negative I referred her to  orthopedics, but she did not end up being seen.  She now notes some postprandial symptoms suspicious for gallbladder colic.  Will obtain a right upper quadrant ultrasound, labs as above ASAP If this is not helpful, plan to proceed to possible CT scan I counseled her to seek immediate care if any severe or sudden worsening, fever, vomiting Moderate medical decision making This visit occurred during the SARS-CoV-2 public health emergency.  Safety protocols were in place, including screening questions prior to the visit, additional usage of staff PPE, and extensive cleaning of exam room while observing appropriate contact time as indicated for disinfecting solutions.    Signed Janett Billow  Mckennah Kretchmer, MD   Received her labs 3/26, message to patient  Results for orders placed or performed in visit on 05/20/19  CBC  Result Value Ref Range   WBC 5.0 4.0 - 10.5 K/uL   RBC 3.84 (L) 3.87 - 5.11 Mil/uL   Platelets 228.0 150.0 - 400.0 K/uL   Hemoglobin 12.2 12.0 - 15.0 g/dL   HCT 35.6 (L) 36.0 - 46.0 %   MCV 92.8 78.0 - 100.0 fl   MCHC 34.2 30.0 - 36.0 g/dL   RDW 12.1 11.5 - 15.5 %  Comprehensive metabolic panel  Result Value Ref Range   Sodium 137 135 - 145 mEq/L   Potassium 4.1 3.5 - 5.1 mEq/L   Chloride 104 96 - 112 mEq/L   CO2 29 19 - 32 mEq/L   Glucose, Bld 85 70 - 99 mg/dL   BUN 22 6 - 23 mg/dL   Creatinine, Ser 0.74 0.40 - 1.20 mg/dL   Total Bilirubin 0.5 0.2 - 1.2 mg/dL   Alkaline Phosphatase 58 39 - 117 U/L   AST 19 0 - 37 U/L   ALT 13 0 - 35 U/L   Total Protein 6.6 6.0 - 8.3 g/dL   Albumin 4.2 3.5 - 5.2 g/dL   GFR 79.71 >60.00 mL/min   Calcium 9.2 8.4 - 10.5 mg/dL

## 2019-05-21 ENCOUNTER — Encounter: Payer: Self-pay | Admitting: Family Medicine

## 2019-05-21 LAB — COMPREHENSIVE METABOLIC PANEL
ALT: 13 U/L (ref 0–35)
AST: 19 U/L (ref 0–37)
Albumin: 4.2 g/dL (ref 3.5–5.2)
Alkaline Phosphatase: 58 U/L (ref 39–117)
BUN: 22 mg/dL (ref 6–23)
CO2: 29 mEq/L (ref 19–32)
Calcium: 9.2 mg/dL (ref 8.4–10.5)
Chloride: 104 mEq/L (ref 96–112)
Creatinine, Ser: 0.74 mg/dL (ref 0.40–1.20)
GFR: 79.71 mL/min (ref >60.00)
Glucose, Bld: 85 mg/dL (ref 70–99)
Potassium: 4.1 mEq/L (ref 3.5–5.1)
Sodium: 137 mEq/L (ref 135–145)
Total Bilirubin: 0.5 mg/dL (ref 0.2–1.2)
Total Protein: 6.6 g/dL (ref 6.0–8.3)

## 2019-05-21 LAB — CBC
HCT: 35.6 % — ABNORMAL LOW (ref 36.0–46.0)
Hemoglobin: 12.2 g/dL (ref 12.0–15.0)
MCHC: 34.2 g/dL (ref 30.0–36.0)
MCV: 92.8 fl (ref 78.0–100.0)
Platelets: 228 10*3/uL (ref 150.0–400.0)
RBC: 3.84 Mil/uL — ABNORMAL LOW (ref 3.87–5.11)
RDW: 12.1 % (ref 11.5–15.5)
WBC: 5 10*3/uL (ref 4.0–10.5)

## 2019-05-26 ENCOUNTER — Encounter: Payer: Self-pay | Admitting: Family Medicine

## 2019-05-26 ENCOUNTER — Ambulatory Visit (HOSPITAL_BASED_OUTPATIENT_CLINIC_OR_DEPARTMENT_OTHER)
Admission: RE | Admit: 2019-05-26 | Discharge: 2019-05-26 | Disposition: A | Discharge status: Home / Self Care | Payer: BLUE CROSS/BLUE SHIELD | Source: Ambulatory Visit | Attending: Family Medicine | Admitting: Family Medicine

## 2019-05-26 ENCOUNTER — Other Ambulatory Visit: Payer: Self-pay

## 2019-05-26 DIAGNOSIS — R1011 Right upper quadrant pain: Secondary | ICD-10-CM | POA: Diagnosis not present

## 2019-05-26 DIAGNOSIS — R109 Unspecified abdominal pain: Secondary | ICD-10-CM

## 2019-05-31 ENCOUNTER — Encounter: Payer: Self-pay | Admitting: Gastroenterology

## 2019-06-01 ENCOUNTER — Encounter: Payer: Self-pay | Admitting: Family Medicine

## 2019-06-03 ENCOUNTER — Encounter: Payer: Self-pay | Admitting: Family Medicine

## 2019-06-03 ENCOUNTER — Other Ambulatory Visit: Payer: Self-pay | Admitting: Family Medicine

## 2019-06-03 DIAGNOSIS — R1011 Right upper quadrant pain: Secondary | ICD-10-CM

## 2019-06-03 NOTE — Progress Notes (Signed)
Called for peer to peer- was able to get Ct abd only approved Will change order and alert pt

## 2019-06-07 ENCOUNTER — Ambulatory Visit (HOSPITAL_BASED_OUTPATIENT_CLINIC_OR_DEPARTMENT_OTHER): Payer: BLUE CROSS/BLUE SHIELD

## 2019-06-09 ENCOUNTER — Encounter: Payer: Self-pay | Admitting: Family Medicine

## 2019-06-09 ENCOUNTER — Encounter (HOSPITAL_BASED_OUTPATIENT_CLINIC_OR_DEPARTMENT_OTHER): Payer: Self-pay

## 2019-06-09 ENCOUNTER — Ambulatory Visit (HOSPITAL_BASED_OUTPATIENT_CLINIC_OR_DEPARTMENT_OTHER)
Admission: RE | Admit: 2019-06-09 | Discharge: 2019-06-09 | Disposition: A | Discharge status: Home / Self Care | Payer: BLUE CROSS/BLUE SHIELD | Source: Ambulatory Visit | Attending: Family Medicine | Admitting: Family Medicine

## 2019-06-09 ENCOUNTER — Other Ambulatory Visit: Payer: Self-pay

## 2019-06-09 DIAGNOSIS — R1011 Right upper quadrant pain: Secondary | ICD-10-CM | POA: Insufficient documentation

## 2019-06-09 MED ORDER — IOHEXOL 300 MG/ML  SOLN
100.0000 mL | Freq: Once | INTRAMUSCULAR | Status: AC | PRN
  Administered 2019-06-09: 100 mL via INTRAVENOUS

## 2019-06-12 ENCOUNTER — Other Ambulatory Visit: Payer: Self-pay | Admitting: Family Medicine

## 2019-06-23 ENCOUNTER — Other Ambulatory Visit: Payer: Self-pay

## 2019-06-23 ENCOUNTER — Ambulatory Visit (AMBULATORY_SURGERY_CENTER): Payer: Self-pay | Admitting: *Deleted

## 2019-06-23 VITALS — Temp 97.7°F | Ht 66.5 in | Wt 180.0 lb

## 2019-06-23 DIAGNOSIS — L738 Other specified follicular disorders: Secondary | ICD-10-CM | POA: Diagnosis not present

## 2019-06-23 DIAGNOSIS — L821 Other seborrheic keratosis: Secondary | ICD-10-CM | POA: Diagnosis not present

## 2019-06-23 DIAGNOSIS — Z85828 Personal history of other malignant neoplasm of skin: Secondary | ICD-10-CM | POA: Diagnosis not present

## 2019-06-23 DIAGNOSIS — D2271 Melanocytic nevi of right lower limb, including hip: Secondary | ICD-10-CM | POA: Diagnosis not present

## 2019-06-23 DIAGNOSIS — L820 Inflamed seborrheic keratosis: Secondary | ICD-10-CM | POA: Diagnosis not present

## 2019-06-23 DIAGNOSIS — Z1211 Encounter for screening for malignant neoplasm of colon: Secondary | ICD-10-CM

## 2019-06-23 MED ORDER — NA SULFATE-K SULFATE-MG SULF 17.5-3.13-1.6 GM/177ML PO SOLN: ORAL | 0 refills | Status: DC

## 2019-06-23 NOTE — Progress Notes (Signed)
Patient is here in-person for PV. Patient denies any allergies to eggs or soy. Patient denies any problems with anesthesia/sedation. Patient denies any oxygen use at home. Patient denies taking any diet/weight loss medications or blood thinners. Patient is not being treated for MRSA or C-diff. EMMI education assisgned to the patient for the procedure, this was explained and instructions given to patient. Patient is aware of our care-partner policy and 0000000 safety protocol. Completed covid vaccines on 06/07/2019. Offered OV with Cirigliano, MD due to pt stating her Blood count is low per PCP, pt declined OV at this time. Pt requested to move colonoscopy appointment to June.  Prep Prescription coupon given to the patient.

## 2019-06-30 DIAGNOSIS — H52201 Unspecified astigmatism, right eye: Secondary | ICD-10-CM | POA: Diagnosis not present

## 2019-06-30 DIAGNOSIS — H10413 Chronic giant papillary conjunctivitis, bilateral: Secondary | ICD-10-CM | POA: Diagnosis not present

## 2019-06-30 DIAGNOSIS — H5213 Myopia, bilateral: Secondary | ICD-10-CM | POA: Diagnosis not present

## 2019-07-02 ENCOUNTER — Encounter: Payer: BLUE CROSS/BLUE SHIELD | Admitting: Gastroenterology

## 2019-07-07 ENCOUNTER — Telehealth: Payer: Self-pay | Admitting: Family Medicine

## 2019-07-07 MED ORDER — VALACYCLOVIR HCL 1 G PO TABS: ORAL_TABLET | ORAL | 0 refills | Status: DC

## 2019-07-07 NOTE — Telephone Encounter (Signed)
Caller : Erie Coors Call Back # [phone]   Patient states she woke with a fever blister, patient is requesting a prescriptions for it. Patient states that she has had a medication for this problem before.   [id number] [place], [place] - [address] [place] [address]  Phone:  [phone] Fax:  [phone]  DEA #:  [id number]   Please Advise

## 2019-07-15 DIAGNOSIS — M545 Low back pain: Secondary | ICD-10-CM | POA: Diagnosis not present

## 2019-07-15 DIAGNOSIS — M25561 Pain in right knee: Secondary | ICD-10-CM | POA: Diagnosis not present

## 2019-07-19 ENCOUNTER — Encounter: Payer: Self-pay | Admitting: Gastroenterology

## 2019-07-30 ENCOUNTER — Encounter: Payer: BLUE CROSS/BLUE SHIELD | Admitting: Gastroenterology

## 2019-08-05 ENCOUNTER — Encounter: Payer: Self-pay | Admitting: Certified Registered Nurse Anesthetist

## 2019-08-06 ENCOUNTER — Other Ambulatory Visit: Payer: Self-pay

## 2019-08-06 ENCOUNTER — Ambulatory Visit (AMBULATORY_SURGERY_CENTER): Payer: BLUE CROSS/BLUE SHIELD | Admitting: Gastroenterology

## 2019-08-06 ENCOUNTER — Encounter: Payer: Self-pay | Admitting: Gastroenterology

## 2019-08-06 VITALS — BP 118/69 | HR 59 | Temp 97.5°F | Resp 10 | Ht 66.5 in | Wt 180.0 lb

## 2019-08-06 DIAGNOSIS — K621 Rectal polyp: Secondary | ICD-10-CM

## 2019-08-06 DIAGNOSIS — K635 Polyp of colon: Secondary | ICD-10-CM | POA: Diagnosis not present

## 2019-08-06 DIAGNOSIS — K5730 Diverticulosis of large intestine without perforation or abscess without bleeding: Secondary | ICD-10-CM

## 2019-08-06 DIAGNOSIS — D125 Benign neoplasm of sigmoid colon: Secondary | ICD-10-CM

## 2019-08-06 DIAGNOSIS — K640 First degree hemorrhoids: Secondary | ICD-10-CM

## 2019-08-06 DIAGNOSIS — Z1211 Encounter for screening for malignant neoplasm of colon: Secondary | ICD-10-CM

## 2019-08-06 DIAGNOSIS — D123 Benign neoplasm of transverse colon: Secondary | ICD-10-CM

## 2019-08-06 DIAGNOSIS — D129 Benign neoplasm of anus and anal canal: Secondary | ICD-10-CM

## 2019-08-06 DIAGNOSIS — D128 Benign neoplasm of rectum: Secondary | ICD-10-CM

## 2019-08-06 MED ORDER — SODIUM CHLORIDE 0.9 % IV SOLN
500.0000 mL | Freq: Once | INTRAVENOUS | Status: DC
Start: 2019-08-06 — End: 2019-08-06

## 2019-08-06 NOTE — Progress Notes (Signed)
Called to room to assist during endoscopic procedure.  Patient ID and intended procedure confirmed with present staff. Received instructions for my participation in the procedure from the performing physician.  

## 2019-08-06 NOTE — Op Note (Signed)
[place] Patient Name: Kimberly Kent Procedure Date: 08/06/2019 8:02 AM MRN: [id number] Endoscopist: Gerrit Heck , MD Age: 64 Referring MD:  Date of Birth: [birth year] Gender: Female Account #: [id number] Procedure:                Colonoscopy Indications:              Screening for colorectal malignant neoplasm (last                            colonoscopy was 10 years ago) Medicines:                Monitored Anesthesia Care Procedure:                Pre-Anesthesia Assessment:                           - Prior to the procedure, a History and Physical                            was performed, and patient medications and                            allergies were reviewed. The patient's tolerance of                            previous anesthesia was also reviewed. The risks                            and benefits of the procedure and the sedation                            options and risks were discussed with the patient.                            All questions were answered, and informed consent                            was obtained. Prior Anticoagulants: The patient has                            taken no previous anticoagulant or antiplatelet                            agents. ASA Grade Assessment: II - A patient with                            mild systemic disease. After reviewing the risks                            and benefits, the patient was deemed in                            satisfactory condition to undergo the procedure.  After obtaining informed consent, the colonoscope                            was passed under direct vision. Throughout the                            procedure, the patient's blood pressure, pulse, and                            oxygen saturations were monitored continuously. The                            Colonoscope was introduced through the anus and                            advanced to the the terminal  ileum. The colonoscopy                            was performed without difficulty. The patient                            tolerated the procedure well. The quality of the                            bowel preparation was excellent. The terminal                            ileum, ileocecal valve, appendiceal orifice, and                            rectum were photographed. Scope In: 8:10:06 AM Scope Out: 8:30:40 AM Scope Withdrawal Time: 0 hours 15 minutes 20 seconds  Total Procedure Duration: 0 hours 20 minutes 34 seconds  Findings:                 The perianal and digital rectal examinations were                            normal.                           Two sessile polyps were found in the sigmoid colon                            and transverse colon. The polyps were 4 to 5 mm in                            size. These polyps were removed with a cold snare.                            Resection and retrieval were complete. Estimated                            blood loss was minimal.  A few sessile polyps were found in the rectum. The                            polyps were 1 to 2 mm in size. Several of these                            polyps were removed with a cold snare for                            representative histologic evaluation. Resection and                            retrieval were complete. Estimated blood loss was                            minimal.                           A single small-mouthed diverticulum was found in                            the sigmoid colon.                           Non-bleeding internal hemorrhoids were found during                            retroflexion. The hemorrhoids were small and Grade                            I (internal hemorrhoids that do not prolapse).                           The terminal ileum appeared normal. Complications:            No immediate complications. Estimated Blood Loss:     Estimated  blood loss was minimal. Impression:               - Two 4 to 5 mm polyps in the sigmoid colon and in                            the transverse colon, removed with a cold snare.                            Resected and retrieved.                           - A few 1 to 2 mm polyps in the rectum, removed                            with a cold snare. Resected and retrieved.                           - Diverticulosis in the sigmoid colon.                           -  Non-bleeding internal hemorrhoids.                           - The examined portion of the ileum was normal. Recommendation:           - Patient has a contact number available for                            emergencies. The signs and symptoms of potential                            delayed complications were discussed with the                            patient. Return to normal activities tomorrow.                            Written discharge instructions were provided to the                            patient.                           - Resume previous diet.                           - Continue present medications.                           - Await pathology results.                           - Repeat colonoscopy for surveillance based on                            pathology results.                           - Return to GI office PRN.                           - Use fiber, for example Citrucel, Fibercon, Konsyl                            or Metamucil. Gerrit Heck, MD 08/06/2019 8:38:17 AM

## 2019-08-06 NOTE — Progress Notes (Signed)
Report given to PACU, vss 

## 2019-08-06 NOTE — Patient Instructions (Addendum)
Impression/Recommendations:  Polyp handout given to patient. Diverticulosis handout given to patient. Hemorrhoid handout given to patient. Hemorrhoid banding brochure given to patient.  Resume previous diet. Continue present medications. Await pathology results.  Repeat colonoscopy for surveillance.  Date to be determined after pathology results are reviewed.  Return to GI office as needed.  Use fiber, (for example Citrucel, Fibercon, Konsyl or Metamucil).  YOU HAD AN ENDOSCOPIC PROCEDURE TODAY AT [facility]:   Refer to the procedure report that was given to you for any specific questions about what was found during the examination.  If the procedure report does not answer your questions, please call your gastroenterologist to clarify.  If you requested that your care partner not be given the details of your procedure findings, then the procedure report has been included in a sealed envelope for you to review at your convenience later.  YOU SHOULD EXPECT: Some feelings of bloating in the abdomen. Passage of more gas than usual.  Walking can help get rid of the air that was put into your GI tract during the procedure and reduce the bloating. If you had a lower endoscopy (such as a colonoscopy or flexible sigmoidoscopy) you may notice spotting of blood in your stool or on the toilet paper. If you underwent a bowel prep for your procedure, you may not have a normal bowel movement for a few days.  Please Note:  You might notice some irritation and congestion in your nose or some drainage.  This is from the oxygen used during your procedure.  There is no need for concern and it should clear up in a day or so.  SYMPTOMS TO REPORT IMMEDIATELY:   Following lower endoscopy (colonoscopy or flexible sigmoidoscopy):  Excessive amounts of blood in the stool  Significant tenderness or worsening of abdominal pains  Swelling of the abdomen that is new, acute  Fever of 100F or  higher   For urgent or emergent issues, a gastroenterologist can be reached at any hour by calling [phone]. Do not use MyChart messaging for urgent concerns.    DIET:  We do recommend a small meal at first, but then you may proceed to your regular diet.  Drink plenty of fluids but you should avoid alcoholic beverages for 24 hours.  ACTIVITY:  You should plan to take it easy for the rest of today and you should NOT DRIVE or use heavy machinery until tomorrow (because of the sedation medicines used during the test).    FOLLOW UP: Our staff will call the number listed on your records 48-72 hours following your procedure to check on you and address any questions or concerns that you may have regarding the information given to you following your procedure. If we do not reach you, we will leave a message.  We will attempt to reach you two times.  During this call, we will ask if you have developed any symptoms of COVID 19. If you develop any symptoms (ie: fever, flu-like symptoms, shortness of breath, cough etc.) before then, please call [phone].  If you test positive for Covid 19 in the 2 weeks post procedure, please call and report this information to [place].    If any biopsies were taken you will be contacted by phone or by letter within the next 1-3 weeks.  Please call us at [phone] if you have not heard about the biopsies in 3 weeks.    SIGNATURES/CONFIDENTIALITY: You and/or your care partner have signed paperwork  which will be entered into your electronic medical record.  These signatures attest to the fact that that the information above on your After Visit Summary has been reviewed and is understood.  Full responsibility of the confidentiality of this discharge information lies with you and/or your care-partner.

## 2019-08-06 NOTE — Progress Notes (Signed)
VS- Courtney Washington  Pt's states no medical or surgical changes since previsit or office visit.  Pt's tubing changed to PFO tubing per Lafe Garin CRNA

## 2019-08-10 ENCOUNTER — Telehealth: Payer: Self-pay

## 2019-08-10 NOTE — Telephone Encounter (Signed)
Second attempt follow up call to pt, lm on vm. 

## 2019-08-10 NOTE — Telephone Encounter (Signed)
No answer, left message to call back later today, B.Dezirea Mccollister RN. 

## 2019-08-11 ENCOUNTER — Encounter: Payer: Self-pay | Admitting: Gastroenterology

## 2019-09-29 ENCOUNTER — Other Ambulatory Visit: Payer: Self-pay | Admitting: Family Medicine

## 2019-09-29 DIAGNOSIS — F428 Other obsessive-compulsive disorder: Secondary | ICD-10-CM

## 2019-11-03 DIAGNOSIS — L292 Pruritus vulvae: Secondary | ICD-10-CM | POA: Diagnosis not present

## 2019-11-03 DIAGNOSIS — N905 Atrophy of vulva: Secondary | ICD-10-CM | POA: Diagnosis not present

## 2019-11-25 ENCOUNTER — Encounter: Payer: Self-pay | Admitting: Family Medicine

## 2020-01-04 DIAGNOSIS — S76311A Strain of muscle, fascia and tendon of the posterior muscle group at thigh level, right thigh, initial encounter: Secondary | ICD-10-CM | POA: Diagnosis not present

## 2020-01-04 DIAGNOSIS — M1711 Unilateral primary osteoarthritis, right knee: Secondary | ICD-10-CM | POA: Diagnosis not present

## 2020-01-04 DIAGNOSIS — S335XXA Sprain of ligaments of lumbar spine, initial encounter: Secondary | ICD-10-CM | POA: Diagnosis not present

## 2020-01-10 DIAGNOSIS — M25559 Pain in unspecified hip: Secondary | ICD-10-CM | POA: Diagnosis not present

## 2020-01-12 DIAGNOSIS — M1711 Unilateral primary osteoarthritis, right knee: Secondary | ICD-10-CM | POA: Diagnosis not present

## 2020-01-12 DIAGNOSIS — M545 Low back pain, unspecified: Secondary | ICD-10-CM | POA: Diagnosis not present

## 2020-01-19 DIAGNOSIS — M545 Low back pain, unspecified: Secondary | ICD-10-CM | POA: Diagnosis not present

## 2020-01-24 ENCOUNTER — Encounter: Payer: Self-pay | Admitting: Family Medicine

## 2020-02-07 ENCOUNTER — Ambulatory Visit: Payer: BLUE CROSS/BLUE SHIELD | Admitting: Family Medicine

## 2020-02-27 ENCOUNTER — Encounter: Payer: Self-pay | Admitting: Family Medicine

## 2020-02-27 MED ORDER — PSEUDOEPH-BROMPHEN-DM 30-2-10 MG/5ML PO SYRP: 5.0000 mL | ORAL_SOLUTION | Freq: Four times a day (QID) | ORAL | 0 refills | Status: DC | PRN

## 2020-03-15 ENCOUNTER — Other Ambulatory Visit: Payer: Self-pay | Admitting: Family Medicine

## 2020-03-15 NOTE — Telephone Encounter (Signed)
Sent patient MyChart message to schedule appt as we have not seen patient since 2020.

## 2020-03-27 NOTE — Patient Instructions (Addendum)
It was great to see you again today, I will be in touch with your labs as soon as possible Assuming your labs look ok, I would start on Rogaine foam for hair loss I refilled your meloxicam- use this OR celebrex, not both Flu shot today Shingles vaccine at your convenience  Take care!

## 2020-03-27 NOTE — Progress Notes (Addendum)
[facility] [address], [place], [place] [address] [phone]  Date:  03/30/2020   Name:  Kimberly Kent   DOB:  04-04-1955   MRN:  [id number]  PCP:  Darreld Mclean, MD    Chief Complaint: Thyroid Problem   History of Present Illness:  Kimberly Kent is a 65 y.o. very pleasant female patient who presents with the following:  Patient with history of migraine headache, hypothyroidism, PFO, polyarthralgia/arthritis/lumbar stenosis Here today for follow-up visit-she has been to see dermatology for thinning hair, her dermatologist recommended a thyroid level. We last checked her thyroid about 1 year ago, it was normal at that time   Lab Results  Component Value Date   TSH 1.29 03/03/2019   HIV screening-okay to complete today Pap smear- Dr Gita Kudo  Mammogram- per Isaiah Blakes Flu vaccine-give today COVID series-done ?  Shingrix-patient is interested, but does not wish to do today Most recent routine labs March 2021  Celebrex Vitamin D Fluoxetine 10 Gabapentin 200 nightly  Synthroid 75 mcg  Patient Active Problem List   Diagnosis Date Noted  . Greater trochanteric bursitis of right hip 04/30/2018  . Polyarthralgia 04/03/2018  . Degenerative arthritis of right knee 04/03/2018  . Lumbar spinal stenosis 04/03/2018  . PFO (patent foramen ovale) 01/13/2018  . Vitamin D deficiency 03/06/2014  . Migraine with aura 05/20/2011  . Hypothyroid 05/20/2011  . SHORTNESS OF BREATH 12/22/2009  . TRANSIENT VISUAL LOSS 07/13/2008    Past Medical History:  Diagnosis Date  . Anemia   . Anxiety   . Arthritis   . Blood transfusion without reported diagnosis   . Depression   . Fibromyalgia 12/29/2013  . Hyperplastic colon polyp   . Migraines   . Neuromuscular disorder ([place])   . Patent foramen ovale    Use filter on all IV fluids due to PFO  . Post-operative nausea and vomiting   . Special circumstances    Use filter on all  IV tubing due to PFO  . Thyroid disease   . Vitamin D deficiency     Past Surgical History:  Procedure Laterality Date  . ABDOMINAL HYSTERECTOMY    . COLONOSCOPY  04/06/2009  . ENDOVENOUS ABLATION SAPHENOUS VEIN W/ LASER Left 2010   Approx 2010 per patient, Has LLE EVLT at [facility] in [place], had a reaction to Tumescent??? given during the procedure, could not follow through with procedure on Right leg.  Marland Kitchen KNEE ARTHROSCOPY     right knee/arthroscopic  . TONSILLECTOMY    . UPPER GASTROINTESTINAL ENDOSCOPY  2015    Social History   Tobacco Use  . Smoking status: Never Smoker  . Smokeless tobacco: Never Used  Vaping Use  . Vaping Use: Never used  Substance Use Topics  . Alcohol use: Yes    Comment: occ  . Drug use: No    Family History  Problem Relation Age of Onset  . Cancer Mother   . Cancer Father   . Diabetes Father   . Parkinson's disease Maternal Grandmother   . Cancer Maternal Grandfather   . Colon cancer Neg Hx   . Colon polyps Neg Hx   . Esophageal cancer Neg Hx   . Rectal cancer Neg Hx   . Stomach cancer Neg Hx     Allergies  Allergen Reactions  . Bupropion Hcl     REACTION: hives  . Codeine     REACTION: nausea  and vomiting    Medication list has been reviewed and updated.  Current Outpatient Medications on File Prior to Visit  Medication Sig Dispense Refill  . brompheniramine-pseudoephedrine-DM 30-2-10 MG/5ML syrup Take 5 mLs by mouth 4 (four) times daily as needed. 120 mL 0  . CELEBREX 100 MG capsule Take 100 mg by mouth 2 (two) times daily.    . cholecalciferol (VITAMIN D3) 25 MCG (1000 UNIT) tablet Take 2,000 Units by mouth daily.    Marland Kitchen FLUoxetine (PROZAC) 10 MG capsule Take 1 capsule (10 mg total) by mouth daily. 90 capsule 1  . gabapentin (NEURONTIN) 100 MG capsule TAKE 2 CAPSULES (200 MG TOTAL) BY MOUTH AT BEDTIME. 180 capsule 2  . SYNTHROID 75 MCG tablet TAKE 1 TABLET (75 MCG TOTAL) BY MOUTH DAILY BEFORE BREAKFAST. 90 tablet 1  .  valACYclovir (VALTREX) 1000 MG tablet Take 2 gm once, repeat 12 hours later. Use as needed for cold sore 20 tablet 0   No current facility-administered medications on file prior to visit.    Review of Systems:  As per HPI- otherwise negative.   Physical Examination: Vitals:   03/30/20 1445  BP: (!) 124/97  Pulse: 84  Resp: 17  SpO2: 97%   Vitals:   03/30/20 1445  Weight: 186 lb (84.4 kg)  Height: 5' 6.5" (1.689 m)   Body mass index is 29.57 kg/m. Ideal Body Weight: Weight in (lb) to have BMI = 25: 156.9  GEN: no acute distress.  Overweight, otherwise looks well.  She has diffuse thinning of hair mostly at the frontal hairline.  No bald patches.  Thinning is mild, but noticeable on close examination HEENT: Atraumatic, Normocephalic.  Ears and Nose: No external deformity. CV: RRR, No M/G/R. No JVD. No thrill. No extra heart sounds. PULM: CTA B, no wheezes, crackles, rhonchi. No retractions. No resp. distress. No accessory muscle use. ABD: S, NT, ND, +BS. No rebound. No HSM. EXTR: No c/c/e PSYCH: Normally interactive. Conversant.    Assessment and Plan: Fatigue, unspecified type - Plan: TSH, VITAMIN D 25 Hydroxy (Vit-D Deficiency, Fractures), Ferritin, Sedimentation rate  Vitamin D deficiency - Plan: VITAMIN D 25 Hydroxy (Vit-D Deficiency, Fractures)  Acquired hypothyroidism - Plan: TSH  Screening for hyperlipidemia - Plan: Lipid panel  Screening for diabetes mellitus - Plan: Comprehensive metabolic panel, Hemoglobin A1c  Screening for deficiency anemia - Plan: CBC  Screening for HIV (human immunodeficiency virus) - Plan: HIV Antibody (routine testing w rflx)  Obsessive thinking - Plan: FLUoxetine (PROZAC) 10 MG capsule  Hypothyroidism - Plan: SYNTHROID 75 MCG tablet  Spinal stenosis of lumbar region with neurogenic claudication - Plan: gabapentin (NEURONTIN) 100 MG capsule, meloxicam (MOBIC) 15 MG tablet  Needs flu shot - Plan: Flu Vaccine QUAD 6+ mos PF IM  (Fluarix Quad PF)  Patient today with concern of thinning hair.  She is treated for hypothyroidism, check TSH today.  We will also check her iron level.  If these are normal, I suggested that she try Rogaine foam for hair regrowth Other routine labs are pending as above Refilled medications Flu shot given She plans to do shingles vaccine at a later date This visit occurred during the SARS-CoV-2 public health emergency.  Safety protocols were in place, including screening questions prior to the visit, additional usage of staff PPE, and extensive cleaning of exam room while observing appropriate contact time as indicated for disinfecting solutions.    Signed Lamar Blinks, MD  Received her labs, message to patient 2/5  Results  for orders placed or performed in visit on 03/30/20  CBC  Result Value Ref Range   WBC 6.3 4.0 - 10.5 K/uL   RBC 3.88 3.87 - 5.11 Mil/uL   Platelets 245.0 150.0 - 400.0 K/uL   Hemoglobin 12.2 12.0 - 15.0 g/dL   HCT 36.1 36.0 - 46.0 %   MCV 93.0 78.0 - 100.0 fl   MCHC 33.9 30.0 - 36.0 g/dL   RDW 12.9 11.5 - 15.5 %  Comprehensive metabolic panel  Result Value Ref Range   Sodium 139 135 - 145 mEq/L   Potassium 4.2 3.5 - 5.1 mEq/L   Chloride 102 96 - 112 mEq/L   CO2 28 19 - 32 mEq/L   Glucose, Bld 94 70 - 99 mg/dL   BUN 22 6 - 23 mg/dL   Creatinine, Ser 0.74 0.40 - 1.20 mg/dL   Total Bilirubin 0.7 0.2 - 1.2 mg/dL   Alkaline Phosphatase 60 39 - 117 U/L   AST 17 0 - 37 U/L   ALT 11 0 - 35 U/L   Total Protein 6.5 6.0 - 8.3 g/dL   Albumin 4.3 3.5 - 5.2 g/dL   GFR 86.87 >60.00 mL/min   Calcium 9.7 8.4 - 10.5 mg/dL  Hemoglobin A1c  Result Value Ref Range   Hgb A1c MFr Bld 5.2 4.6 - 6.5 %  HIV Antibody (routine testing w rflx)  Result Value Ref Range   HIV 1&2 Ab, 4th Generation NON-REACTIVE NON-REACTI  TSH  Result Value Ref Range   TSH 0.56 0.35 - 4.50 uIU/mL  Lipid panel  Result Value Ref Range   Cholesterol 220 (H) 0 - 200 mg/dL   Triglycerides  64.0 0.0 - 149.0 mg/dL   HDL 66.20 >39.00 mg/dL   VLDL 12.8 0.0 - 40.0 mg/dL   LDL Cholesterol 141 (H) 0 - 99 mg/dL   Total CHOL/HDL Ratio 3    NonHDL 153.45   VITAMIN D 25 Hydroxy (Vit-D Deficiency, Fractures)  Result Value Ref Range   VITD 24.89 (L) 30.00 - 100.00 ng/mL  Ferritin  Result Value Ref Range   Ferritin 26.1 10.0 - 291.0 ng/mL  Sedimentation rate  Result Value Ref Range   Sed Rate 12 0 - 30 mm/hr   The 10-year ASCVD risk score Mikey Bussing DC Jr., et al., 2013) is: 3.7%   Values used to calculate the score:     Age: 27 years     Sex: Female     Is Non-Hispanic African American: No     Diabetic: No     Tobacco smoker: No     Systolic Blood Pressure: 659 mmHg     Is BP treated: No     HDL Cholesterol: 66.2 mg/dL     Total Cholesterol: 220 mg/dL

## 2020-03-30 ENCOUNTER — Ambulatory Visit: Payer: Managed Care, Other (non HMO) | Admitting: Family Medicine

## 2020-03-30 ENCOUNTER — Other Ambulatory Visit: Payer: Self-pay

## 2020-03-30 ENCOUNTER — Encounter: Payer: Self-pay | Admitting: Family Medicine

## 2020-03-30 VITALS — BP 124/97 | HR 84 | Resp 17 | Ht 66.5 in | Wt 186.0 lb

## 2020-03-30 DIAGNOSIS — E039 Hypothyroidism, unspecified: Secondary | ICD-10-CM

## 2020-03-30 DIAGNOSIS — Z131 Encounter for screening for diabetes mellitus: Secondary | ICD-10-CM

## 2020-03-30 DIAGNOSIS — E559 Vitamin D deficiency, unspecified: Secondary | ICD-10-CM | POA: Diagnosis not present

## 2020-03-30 DIAGNOSIS — M48062 Spinal stenosis, lumbar region with neurogenic claudication: Secondary | ICD-10-CM

## 2020-03-30 DIAGNOSIS — Z130 Encounter for screening for diseases of the blood and blood-forming organs and certain disorders involving the immune mechanism: Secondary | ICD-10-CM

## 2020-03-30 DIAGNOSIS — Z114 Encounter for screening for human immunodeficiency virus [HIV]: Secondary | ICD-10-CM

## 2020-03-30 DIAGNOSIS — Z13220 Encounter for screening for lipoid disorders: Secondary | ICD-10-CM | POA: Diagnosis not present

## 2020-03-30 DIAGNOSIS — F428 Other obsessive-compulsive disorder: Secondary | ICD-10-CM

## 2020-03-30 DIAGNOSIS — R5383 Other fatigue: Secondary | ICD-10-CM

## 2020-03-30 DIAGNOSIS — Z23 Encounter for immunization: Secondary | ICD-10-CM | POA: Diagnosis not present

## 2020-03-30 MED ORDER — MELOXICAM 15 MG PO TABS
15.0000 mg | ORAL_TABLET | Freq: Every day | ORAL | 1 refills | Status: DC
Start: 2020-03-30 — End: 2021-05-10

## 2020-03-30 MED ORDER — GABAPENTIN 100 MG PO CAPS: 200.0000 mg | ORAL_CAPSULE | Freq: Every day | ORAL | 2 refills | Status: DC

## 2020-03-30 MED ORDER — FLUOXETINE HCL 10 MG PO CAPS
10.0000 mg | ORAL_CAPSULE | Freq: Every day | ORAL | 3 refills | Status: DC
Start: 2020-03-30 — End: 2020-12-26

## 2020-03-30 MED ORDER — SYNTHROID 75 MCG PO TABS: 75.0000 ug | ORAL_TABLET | Freq: Every day | ORAL | 1 refills | Status: DC

## 2020-03-31 ENCOUNTER — Encounter: Payer: Self-pay | Admitting: Family Medicine

## 2020-03-31 ENCOUNTER — Telehealth: Payer: Self-pay

## 2020-03-31 LAB — FERRITIN: Ferritin: 26.1 ng/mL (ref 10.0–291.0)

## 2020-03-31 LAB — COMPREHENSIVE METABOLIC PANEL
ALT: 11 U/L (ref 0–35)
AST: 17 U/L (ref 0–37)
Albumin: 4.3 g/dL (ref 3.5–5.2)
Alkaline Phosphatase: 60 U/L (ref 39–117)
BUN: 22 mg/dL (ref 6–23)
CO2: 28 mEq/L (ref 19–32)
Calcium: 9.7 mg/dL (ref 8.4–10.5)
Chloride: 102 mEq/L (ref 96–112)
Creatinine, Ser: 0.74 mg/dL (ref 0.40–1.20)
GFR: 86.87 mL/min (ref >60.00)
Glucose, Bld: 94 mg/dL (ref 70–99)
Potassium: 4.2 mEq/L (ref 3.5–5.1)
Sodium: 139 mEq/L (ref 135–145)
Total Bilirubin: 0.7 mg/dL (ref 0.2–1.2)
Total Protein: 6.5 g/dL (ref 6.0–8.3)

## 2020-03-31 LAB — VITAMIN D 25 HYDROXY (VIT D DEFICIENCY, FRACTURES): VITD: 24.89 ng/mL — ABNORMAL LOW (ref 30.00–100.00)

## 2020-03-31 LAB — SEDIMENTATION RATE: Sed Rate: 12 mm/hr (ref 0–30)

## 2020-03-31 LAB — CBC
HCT: 36.1 % (ref 36.0–46.0)
Hemoglobin: 12.2 g/dL (ref 12.0–15.0)
MCHC: 33.9 g/dL (ref 30.0–36.0)
MCV: 93 fl (ref 78.0–100.0)
Platelets: 245 10*3/uL (ref 150.0–400.0)
RBC: 3.88 Mil/uL (ref 3.87–5.11)
RDW: 12.9 % (ref 11.5–15.5)
WBC: 6.3 10*3/uL (ref 4.0–10.5)

## 2020-03-31 LAB — LIPID PANEL
Cholesterol: 220 mg/dL — ABNORMAL HIGH (ref 0–200)
HDL: 66.2 mg/dL (ref >39.00)
LDL Cholesterol: 141 mg/dL — ABNORMAL HIGH (ref 0–99)
NonHDL: 153.45
Total CHOL/HDL Ratio: 3
Triglycerides: 64 mg/dL (ref 0.0–149.0)
VLDL: 12.8 mg/dL (ref 0.0–40.0)

## 2020-03-31 LAB — HEMOGLOBIN A1C: Hgb A1c MFr Bld: 5.2 % (ref 4.6–6.5)

## 2020-03-31 LAB — TSH: TSH: 0.56 u[IU]/mL (ref 0.35–4.50)

## 2020-03-31 LAB — HIV ANTIBODY (ROUTINE TESTING W REFLEX): HIV 1&2 Ab, 4th Generation: NONREACTIVE

## 2020-03-31 MED ORDER — LEVOTHYROXINE SODIUM 75 MCG PO TABS: 75.0000 ug | ORAL_TABLET | Freq: Every day | ORAL | 1 refills | Status: DC

## 2020-03-31 NOTE — Telephone Encounter (Signed)
I spoke with pt- generic is ok,  Thanks [facility]

## 2020-03-31 NOTE — Telephone Encounter (Signed)
Rx changed to  generic

## 2020-03-31 NOTE — Telephone Encounter (Signed)
Received PA request for brand name Synthroid. Is there a reason Pt needs to be on brand only?

## 2020-03-31 NOTE — Addendum Note (Signed)
Addended byDamita Dunnings D on: 03/31/2020 08:23 AM   Modules accepted: Orders

## 2020-04-01 ENCOUNTER — Encounter: Payer: Self-pay | Admitting: Family Medicine

## 2020-04-12 ENCOUNTER — Encounter: Payer: Self-pay | Admitting: Family Medicine

## 2020-04-27 ENCOUNTER — Encounter: Payer: Self-pay | Admitting: Family Medicine

## 2020-04-27 ENCOUNTER — Other Ambulatory Visit: Payer: Self-pay | Admitting: Family Medicine

## 2020-04-27 DIAGNOSIS — E039 Hypothyroidism, unspecified: Secondary | ICD-10-CM

## 2020-04-27 MED ORDER — VALACYCLOVIR HCL 1 G PO TABS: ORAL_TABLET | ORAL | 1 refills | Status: DC

## 2020-04-27 MED ORDER — LEVOTHYROXINE SODIUM 75 MCG PO TABS: 75.0000 ug | ORAL_TABLET | Freq: Every day | ORAL | 1 refills | Status: DC

## 2020-04-28 MED ORDER — LEVOTHYROXINE SODIUM 75 MCG PO TABS: 75.0000 ug | ORAL_TABLET | Freq: Every day | ORAL | 1 refills | Status: DC

## 2020-04-28 NOTE — Addendum Note (Signed)
Addended by: Lamar Blinks C on: 04/28/2020 05:16 PM   Modules accepted: Orders

## 2020-04-28 NOTE — Telephone Encounter (Signed)
Called pt- she is ok with trying a generic.  I have sent in rx for her and she will let me know if any trouble getting it filled

## 2020-04-28 NOTE — Telephone Encounter (Signed)
Im not sure why the PA was denied. Please advise on alternative or if there are any further steps you would like me to take?

## 2020-05-04 MED ORDER — LEVOTHYROXINE SODIUM 75 MCG PO TABS: 75.0000 ug | ORAL_TABLET | Freq: Every day | ORAL | 3 refills | Status: DC

## 2020-05-04 NOTE — Addendum Note (Signed)
Addended by: Darreld Mclean on: 05/04/2020 08:32 PM   Modules accepted: Orders

## 2020-06-14 NOTE — Progress Notes (Signed)
[facility] [address] [place] Phone: [phone] Subjective:   I [name] am serving as a Education administrator for Dr. Hulan Saas.  This visit occurred during the SARS-CoV-2 public health emergency.  Safety protocols were in place, including screening questions prior to the visit, additional usage of staff PPE, and extensive cleaning of exam room while observing appropriate contact time as indicated for disinfecting solutions.    I'm seeing this patient by the request  of:  Copland, Gay Filler, MD  CC: back pain   [id number]  Kimberly Kent is a 65 y.o. female coming in with complaint of back pain. Last seen in 2020 for Lumbar spinal stenosis. Proximal hamstring pain. States the muscle and tight and swells. States she can feel knots. Pain wakes her up at night. Extension, walking and standing is most painful. States she hasn't been right since falling on the hips. Pain radiates the the posterior knee. Patient has tried ice. Still taking meloxicam.    Patient did have x-rays in 2020 of the lumbar spine that were independently visualized by me showing the patient did have mild degenerative changes throughout the lumbar spine most narrowing at L4-L5  Past Medical History:  Diagnosis Date  . Anemia   . Anxiety   . Arthritis   . Blood transfusion without reported diagnosis   . Depression   . Fibromyalgia 12/29/2013  . Hyperplastic colon polyp   . Migraines   . Neuromuscular disorder ([place])   . Patent foramen ovale    Use filter on all IV fluids due to PFO  . Post-operative nausea and vomiting   . Special circumstances    Use filter on all IV tubing due to PFO  . Thyroid disease   . Vitamin D deficiency    Past Surgical History:  Procedure Laterality Date  . ABDOMINAL HYSTERECTOMY    . COLONOSCOPY  04/06/2009  . ENDOVENOUS ABLATION SAPHENOUS VEIN W/ LASER Left 2010   Approx 2010 per patient, Has LLE EVLT at [facility] in  [place], had a reaction to Tumescent??? given during the procedure, could not follow through with procedure on Right leg.  Marland Kitchen KNEE ARTHROSCOPY     right knee/arthroscopic  . TONSILLECTOMY    . UPPER GASTROINTESTINAL ENDOSCOPY  2015   Social History   Socioeconomic History  . Marital status: Married    Spouse name: Not on file  . Number of children: Not on file  . Years of education: Not on file  . Highest education level: Not on file  Occupational History  . Occupation: mortgage Biomedical scientist  Tobacco Use  . Smoking status: Never Smoker  . Smokeless tobacco: Never Used  Vaping Use  . Vaping Use: Never used  Substance and Sexual Activity  . Alcohol use: Yes    Comment: occ  . Drug use: No  . Sexual activity: Not on file  Other Topics Concern  . Not on file  Social History Narrative  . Not on file   Social Determinants of Health   Financial Resource Strain: Not on file  Food Insecurity: Not on file  Transportation Needs: Not on file  Physical Activity: Not on file  Stress: Not on file  Social Connections: Not on file   Allergies  Allergen Reactions  . Bupropion Hcl     REACTION: hives  . Codeine     REACTION: nausea and vomiting   Family History  Problem Relation Age of Onset  .  Cancer Mother   . Cancer Father   . Diabetes Father   . Parkinson's disease Maternal Grandmother   . Cancer Maternal Grandfather   . Colon cancer Neg Hx   . Colon polyps Neg Hx   . Esophageal cancer Neg Hx   . Rectal cancer Neg Hx   . Stomach cancer Neg Hx     Current Outpatient Medications (Endocrine & Metabolic):  .  levothyroxine (SYNTHROID) 75 MCG tablet, Take 1 tablet (75 mcg total) by mouth daily before breakfast. .  levothyroxine (SYNTHROID) 75 MCG tablet, Take 1 tablet (75 mcg total) by mouth daily.   Current Outpatient Medications (Respiratory):  .  brompheniramine-pseudoephedrine-DM 30-2-10 MG/5ML syrup, Take 5 mLs by mouth 4 (four) times daily as  needed.  Current Outpatient Medications (Analgesics):  .  meloxicam (MOBIC) 15 MG tablet, Take 1 tablet (15 mg total) by mouth daily.   Current Outpatient Medications (Other):  .  cholecalciferol (VITAMIN D3) 25 MCG (1000 UNIT) tablet, Take 2,000 Units by mouth daily. Marland Kitchen  FLUoxetine (PROZAC) 10 MG capsule, Take 1 capsule (10 mg total) by mouth daily. Marland Kitchen  gabapentin (NEURONTIN) 100 MG capsule, Take 2 capsules (200 mg total) by mouth at bedtime. .  valACYclovir (VALTREX) 1000 MG tablet, Take 2 gm once, repeat 12 hours later. Use as needed for cold sore   Reviewed prior external information including notes and imaging from  primary care provider As well as notes that were available from care everywhere and other healthcare systems.  Past medical history, social, surgical and family history all reviewed in electronic medical record.  No pertanent information unless stated regarding to the chief complaint.   Review of Systems:  No headache, visual changes, nausea, vomiting, diarrhea, constipation, dizziness, abdominal pain, skin rash, fevers, chills, night sweats, weight loss, swollen lymph nodes, body aches, joint swelling, chest pain, shortness of breath, mood changes. POSITIVE muscle aches  Objective  Blood pressure 130/84, pulse 68, height 5' 6.5" (1.689 m), weight 189 lb (85.7 kg), SpO2 98 %.   General: No apparent distress alert and oriented x3 mood and affect normal, dressed appropriately.  HEENT: Pupils equal, extraocular movements intact  Respiratory: Patient's speak in full sentences and does not appear short of breath  Cardiovascular: No lower extremity edema, non tender, no erythema  Gait mild antalgic MSK:   Back exam does have some mild loss of lordosis.  Tenderness to palpation in the paraspinal musculature.  Tightness noted with straight leg test.  Mild radicular symptoms also noted at 25 degrees of forward flexion. Moderate tenderness to palpation at the ischial bursa  area. Patient's knee does have arthritic changes noted.  Instability noted with valgus and varus force.  Tenderness to palpation over the lateral joint line and lateral tracking of the patella noted.  After informed written and verbal consent, patient was seated on exam table. Right knee was prepped with alcohol swab and utilizing anterolateral approach, patient's right knee space was injected with 4:1  marcaine 0.5%: Kenalog 40mg /dL. Patient tolerated the procedure well without immediate complications.    Impression and Recommendations:     The above documentation has been reviewed and is accurate and complete Lyndal Pulley, DO

## 2020-06-15 ENCOUNTER — Encounter: Payer: Self-pay | Admitting: Family Medicine

## 2020-06-15 ENCOUNTER — Ambulatory Visit: Payer: Managed Care, Other (non HMO) | Admitting: Family Medicine

## 2020-06-15 ENCOUNTER — Other Ambulatory Visit: Payer: Self-pay

## 2020-06-15 ENCOUNTER — Ambulatory Visit (INDEPENDENT_AMBULATORY_CARE_PROVIDER_SITE_OTHER): Payer: Managed Care, Other (non HMO)

## 2020-06-15 VITALS — BP 130/84 | HR 68 | Ht 66.5 in | Wt 189.0 lb

## 2020-06-15 DIAGNOSIS — G8929 Other chronic pain: Secondary | ICD-10-CM

## 2020-06-15 DIAGNOSIS — M25561 Pain in right knee: Secondary | ICD-10-CM

## 2020-06-15 DIAGNOSIS — S76311A Strain of muscle, fascia and tendon of the posterior muscle group at thigh level, right thigh, initial encounter: Secondary | ICD-10-CM | POA: Insufficient documentation

## 2020-06-15 DIAGNOSIS — M545 Low back pain, unspecified: Secondary | ICD-10-CM

## 2020-06-15 DIAGNOSIS — M48062 Spinal stenosis, lumbar region with neurogenic claudication: Secondary | ICD-10-CM

## 2020-06-15 DIAGNOSIS — M1711 Unilateral primary osteoarthritis, right knee: Secondary | ICD-10-CM

## 2020-06-15 NOTE — Assessment & Plan Note (Signed)
Patient does have more history of the spinal stenosis as well.  Encouraged her to continue the gabapentin if this is causing more the radicular symptoms.  May need to consider the advanced imaging again but at this point we will repeat x-rays to further evaluate.  We will get a pelvis x-ray as well to rule out any avulsion fracture that could be contributing to some of the hamstring issues as well.  Patient has taken meloxicam previously and has had fairly good results.  Once again do not want her to do it on a regular basis but can take it in 3 to 5-day burst if necessary.  Patient does have some signs and symptoms consistent with possible hamstring tendinopathy as well.  Discussed potentially home exercises as well as compression sleeve initially.  Continues to have trouble at follow-up will consider injection.

## 2020-06-15 NOTE — Assessment & Plan Note (Signed)
Patient given injection.  Chronic, with exacerbation.  Could be point of offer while patient's insulin needs over the thyroid.  Discussed with patient that also normal.  Neuropathy is differential.  Discussed a tendinopathy of the hamstring getting a compression sleeve.  Patient will try this before we can consider some injection in the hamstring area.  X-rays pending.  Follow-up with me again 6 weeks

## 2020-06-15 NOTE — Patient Instructions (Addendum)
Good to see you Back, knee and pelvis xray Knee injection  Hamstring exercises Thigh compression daily for 3- 4 weeks See me again in 6 if not better hamstring injection

## 2020-06-15 NOTE — Assessment & Plan Note (Signed)
Patient does complain of discomfort more at the origin of the hamstring on the ischial area.  Discussed with patient about icing regimen, home exercises, which activities to do which wants to avoid.  Increase activity slowly.  Patient will do a thigh compression sleeve.  Home exercises given.  Worsening pain will need to consider the possibility of a either of advanced imaging of the femur or possible injections.  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

## 2020-07-27 ENCOUNTER — Ambulatory Visit: Payer: Managed Care, Other (non HMO) | Admitting: Family Medicine

## 2020-08-10 ENCOUNTER — Ambulatory Visit
Admission: RE | Admit: 2020-08-10 | Discharge: 2020-08-10 | Disposition: A | Discharge status: Home / Self Care | Payer: Managed Care, Other (non HMO) | Source: Ambulatory Visit | Attending: Family Medicine | Admitting: Family Medicine

## 2020-08-10 ENCOUNTER — Encounter: Payer: Self-pay | Admitting: Family Medicine

## 2020-08-10 DIAGNOSIS — S9032XA Contusion of left foot, initial encounter: Secondary | ICD-10-CM

## 2020-08-11 ENCOUNTER — Encounter: Payer: Self-pay | Admitting: Family Medicine

## 2020-08-21 ENCOUNTER — Ambulatory Visit: Payer: Managed Care, Other (non HMO) | Admitting: Family Medicine

## 2020-08-21 ENCOUNTER — Encounter: Payer: Self-pay | Admitting: Family Medicine

## 2020-08-21 ENCOUNTER — Ambulatory Visit: Payer: Self-pay

## 2020-08-21 ENCOUNTER — Other Ambulatory Visit: Payer: Self-pay

## 2020-08-21 VITALS — BP 110/64 | HR 71 | Ht 66.0 in | Wt 191.0 lb

## 2020-08-21 DIAGNOSIS — S76311D Strain of muscle, fascia and tendon of the posterior muscle group at thigh level, right thigh, subsequent encounter: Secondary | ICD-10-CM

## 2020-08-21 DIAGNOSIS — M79672 Pain in left foot: Secondary | ICD-10-CM

## 2020-08-21 DIAGNOSIS — M1711 Unilateral primary osteoarthritis, right knee: Secondary | ICD-10-CM | POA: Diagnosis not present

## 2020-08-21 DIAGNOSIS — M7061 Trochanteric bursitis, right hip: Secondary | ICD-10-CM | POA: Diagnosis not present

## 2020-08-21 NOTE — Progress Notes (Signed)
[facility] [place] Phone: [phone] Subjective:   Kimberly Kent, am serving as a scribe for Dr. Hulan Saas.  This visit occurred during the SARS-CoV-2 public health emergency.  Safety protocols were in place, including screening questions prior to the visit, additional usage of staff PPE, and extensive cleaning of exam room while observing appropriate contact time as indicated for disinfecting solutions.     This visit occurred during the SARS-CoV-2 public health emergency.  Safety protocols were in place, including screening questions prior to the visit, additional usage of staff PPE, and extensive cleaning of exam room while observing appropriate contact time as indicated for disinfecting solutions.   I'm seeing this patient by the request  of:  Copland, Gay Filler, MD  CC: low back pain follow up, knee pain follow up   [id number]  06/15/2020 Patient does complain of discomfort more at the origin of the hamstring on the ischial area.  Discussed with patient about icing regimen, home exercises, which activities to do which wants to avoid.  Increase activity slowly.  Patient will do a thigh compression sleeve.  Home exercises given.  Worsening pain will need to consider the possibility of a either of advanced imaging of the femur or possible injections.  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  Encouraged her to continue the gabapentin if this is causing more the radicular symptoms.  May need to consider the advanced imaging again but at this point we will repeat x-rays to further evaluate.  We will get a pelvis x-ray as well to rule out any avulsion fracture that could be contributing to some of the hamstring issues as well.  Patient has taken meloxicam previously and has had  fairly good results.  Once again do not want her to do it on a regular basis but can take it in 3 to 5-day burst if necessary.  Patient does have some signs and symptoms consistent with possible hamstring tendinopathy as well.  Discussed potentially home exercises as well as compression sleeve initially.  Continues  Patient given injection.  Chronic, with exacerbation.  Could be point of offer while patient's insulin needs over the thyroid.  Discussed with patient that also normal.  Neuropathy is differential.  Discussed a tendinopathy of the hamstring getting a compression sleeve.  Patient will try this before we can consider some injection in the hamstring area.  X-rays pending.  Follow-up with me again 6 weeks  Update 08/21/2020 Kimberly Kent is a 65 y.o. female coming in with complaint of hamstring, back, L foot, and R knee pain. Started pilates and hopes that this will help her back pain. Back pain unchanged but tolerable.   Chronic swelling of R knee. Unable to flex joint fully.   Patient has pain in R glute when trying to sleep. Pain is not getting any better.   Mention L foot pain and she said that she was struck by golf ball that landed on superior foot. Not having issues with foot.   Right knee was injected June 15, 2020 right knee x-rays from the same date shows moderate degenerative changes. Lumbar x-rays show the patient does have mild degenerative changes from L1-L4 as well as degenerative changes with facet arthropathy from L4-S1  Patient recently did have a left foot x-ray.  This was independently visualized by me showing the patient did have a calcaneal heel spur and minimal degenerative changes of the midfoot.  Past Medical History:  Diagnosis Date   Anemia    Anxiety    Arthritis    Blood transfusion without reported diagnosis    Depression    Fibromyalgia 12/29/2013   Hyperplastic colon polyp    Migraines    Neuromuscular disorder ([place])    Patent foramen ovale    Use  filter on all IV fluids due to PFO   Post-operative nausea and vomiting    Special circumstances    Use filter on all IV tubing due to PFO   Thyroid disease    Vitamin D deficiency    Past Surgical History:  Procedure Laterality Date   ABDOMINAL HYSTERECTOMY     COLONOSCOPY  04/06/2009   ENDOVENOUS ABLATION SAPHENOUS VEIN W/ LASER Left 2010   Approx 2010 per patient, Has LLE EVLT at [place] in [place], had a reaction to Tumescent??? given during the procedure, could not follow through with procedure on Right leg.   KNEE ARTHROSCOPY     right knee/arthroscopic   TONSILLECTOMY     UPPER GASTROINTESTINAL ENDOSCOPY  2015   Social History   Socioeconomic History   Marital status: Married    Spouse name: Not on file   Number of children: Not on file   Years of education: Not on file   Highest education level: Not on file  Occupational History   Occupation: mortgage industry Advertising copywriter  Tobacco Use   Smoking status: Never   Smokeless tobacco: Never  Vaping Use   Vaping Use: Never used  Substance and Sexual Activity   Alcohol use: Yes    Comment: occ   Drug use: Kent   Sexual activity: Not on file  Other Topics Concern   Not on file  Social History Narrative   Not on file   Social Determinants of Health   Financial Resource Strain: Not on file  Food Insecurity: Not on file  Transportation Needs: Not on file  Physical Activity: Not on file  Stress: Not on file  Social Connections: Not on file   Allergies  Allergen Reactions   Bupropion Hcl     REACTION: hives   Codeine     REACTION: nausea and vomiting   Family History  Problem Relation Age of Onset   Cancer Mother    Cancer Father    Diabetes Father    Parkinson's disease Maternal Grandmother    Cancer Maternal Grandfather    Colon cancer Neg Hx    Colon polyps Neg Hx    Esophageal cancer Neg Hx    Rectal cancer Neg Hx    Stomach cancer Neg Hx     Current Outpatient Medications (Endocrine &  Metabolic):    levothyroxine (SYNTHROID) 75 MCG tablet, Take 1 tablet (75 mcg total) by mouth daily before breakfast.   levothyroxine (SYNTHROID) 75 MCG tablet, Take 1 tablet (75 mcg total) by mouth daily.   Current Outpatient Medications (Respiratory):    brompheniramine-pseudoephedrine-DM 30-2-10 MG/5ML syrup, Take 5 mLs by mouth 4 (four) times daily as needed.  Current Outpatient Medications (Analgesics):    meloxicam (MOBIC) 15 MG tablet, Take 1 tablet (15 mg total) by mouth daily.   Current Outpatient Medications (Other):    cholecalciferol (VITAMIN D3) 25 MCG (1000 UNIT) tablet, Take 2,000 Units by mouth daily.   FLUoxetine (PROZAC) 10 MG capsule, Take 1 capsule (10 mg  total) by mouth daily.   gabapentin (NEURONTIN) 100 MG capsule, Take 2 capsules (200 mg total) by mouth at bedtime.   valACYclovir (VALTREX) 1000 MG tablet, Take 2 gm once, repeat 12 hours later. Use as needed for cold sore   Reviewed prior external information including notes and imaging from  primary care provider As well as notes that were available from care everywhere and other healthcare systems.  Past medical history, social, surgical and family history all reviewed in electronic medical record.  Kent pertanent information unless stated regarding to the chief complaint.   Review of Systems:  Kent headache, visual changes, nausea, vomiting, diarrhea, constipation, dizziness, abdominal pain, skin rash, fevers, chills, night sweats, weight loss, swollen lymph nodes, chest pain, shortness of breath, mood changes. POSITIVE muscle aches, body aches and joint swelling  Objective  There were Kent vitals taken for this visit.   General: Kent apparent distress alert and oriented x3 mood and affect normal, dressed appropriately.  HEENT: Pupils equal, extraocular movements intact  Respiratory: Patient's speak in full sentences and does not appear short of breath  Cardiovascular: Kent lower extremity edema, non tender, Kent  erythema  Gait mild antalgic skin continuing to not have full extension of the right knee. Patient does have mild instability of the knee with valgus and varus force. Back exam does have some mild loss of lordosis. Tightness with FABER test bilaterally.  Tenderness over the hamstring insertion.  More tenderness over the greater trochanteric area on the right side.   Procedure: Real-time Ultrasound Guided Injection of right greater trochanteric bursitis secondary to patient's body habitus Device: GE Logiq Q7 Ultrasound guided injection is preferred based studies that show increased duration, increased effect, greater accuracy, decreased procedural pain, increased response rate, and decreased cost with ultrasound guided versus blind injection.  Verbal informed consent obtained.  Time-out conducted.  Noted Kent overlying erythema, induration, or other signs of local infection.  Skin prepped in a sterile fashion.  Local anesthesia: Topical Ethyl chloride.  With sterile technique and under real time ultrasound guidance:  Greater trochanteric area was visualized and patient's bursa was noted. A 22-gauge 3 inch needle was inserted and 4 cc of 0.5% Marcaine and 1 cc of Kenalog 40 mg/dL was injected. Pictures taken Completed without difficulty  Pain immediately resolved suggesting accurate placement of the medication.  Advised to call if fevers/chills, erythema, induration, drainage, or persistent bleeding.  Impression: Technically successful ultrasound guided injection.    Impression and Recommendations:     The above documentation has been reviewed and is accurate and complete Lyndal Pulley, DO

## 2020-08-21 NOTE — Assessment & Plan Note (Signed)
Continues to have some mild muscle strain noted.  Discussed potential thigh compression that I think would be beneficial in the intermittent meloxicam.  Follow-up if continuing to have difficulty could consider the possibility of MRI of the pelvis to further evaluate but hopefully patient will do relatively well.

## 2020-08-21 NOTE — Patient Instructions (Signed)
Good to see you Continue everything else Injected the GT today See me again in 6-8 weeks we will likely inject the knee

## 2020-08-21 NOTE — Assessment & Plan Note (Signed)
Arthritic changes but improved since the injection.  We will continue to monitor.  Could be a candidate for possible viscosupplementation.  See me again in 6 to 8 weeks otherwise.

## 2020-08-21 NOTE — Assessment & Plan Note (Signed)
Repeat injection given today.  Patient is traveling and hopefully this will be beneficial.  Has had this injected previously but thinks is more of a compensatory mechanism for more of the arthritic changes in the knee.  Follow-up with me again in 6 to 8 weeks

## 2020-09-11 ENCOUNTER — Encounter: Payer: Self-pay | Admitting: Family Medicine

## 2020-09-11 NOTE — Telephone Encounter (Signed)
Called pt when I saw this message at 9:30 pm- no answer so will respond to her message  BP Readings from Last 3 Encounters:  08/21/20 110/64  06/15/20 130/84  03/30/20 (!) 124/97   She does not have HTN not take HTN meds

## 2020-09-12 ENCOUNTER — Ambulatory Visit: Payer: Managed Care, Other (non HMO) | Admitting: Internal Medicine

## 2020-09-12 ENCOUNTER — Ambulatory Visit (HOSPITAL_BASED_OUTPATIENT_CLINIC_OR_DEPARTMENT_OTHER)
Admission: RE | Admit: 2020-09-12 | Discharge: 2020-09-12 | Disposition: A | Discharge status: Home / Self Care | Payer: Managed Care, Other (non HMO) | Source: Ambulatory Visit | Attending: Internal Medicine | Admitting: Internal Medicine

## 2020-09-12 ENCOUNTER — Encounter: Payer: Self-pay | Admitting: Internal Medicine

## 2020-09-12 ENCOUNTER — Other Ambulatory Visit: Payer: Self-pay

## 2020-09-12 VITALS — BP 130/78 | HR 65 | Temp 97.8°F | Resp 18 | Ht 66.5 in | Wt 192.6 lb

## 2020-09-12 DIAGNOSIS — R030 Elevated blood-pressure reading, without diagnosis of hypertension: Secondary | ICD-10-CM

## 2020-09-12 DIAGNOSIS — R519 Headache, unspecified: Secondary | ICD-10-CM | POA: Diagnosis not present

## 2020-09-12 DIAGNOSIS — K219 Gastro-esophageal reflux disease without esophagitis: Secondary | ICD-10-CM | POA: Diagnosis not present

## 2020-09-12 NOTE — Patient Instructions (Signed)
Proceed with a CAT scan of your head today.  Stop meloxicam for few days, take Tylenol instead.  Rest, drink plenty fluids.  Avoid a lot of sodium in your diet.  Check the  blood pressure  daily BP GOAL is between 110/65 and  135/85. If it is consistently higher or lower, let me know   Schedule a visit with your primary doctor for next week.

## 2020-09-12 NOTE — Progress Notes (Signed)
Subjective:    Patient ID: Kimberly Kent, female    DOB: October 12, 1955, 65 y.o.   MRN: [id number]  DOS:  09/12/2020 Type of visit - description: Acute visit Symptoms a started 8 days ago, she developed a headache: The onset was gradual, it is located at the top of her head, different from the migraines she used to have years ago. Is not the worst of her life but is unusually prolonged  Was associated with some nausea but no vomiting. When asked, she admits that her neck has been feeling slightly stiff.  She takes NSAIDs daily. Admits that she has eating a lot of salt lately.  Yesterday she took her BPs at home and they were elevated.  No history of hypertension.  Today for the first time she feels better.  Review of Systems Denies fever chills. No weight loss. No chest pain or difficulty breathing No palpitation. She has occasional aches and pains for which she takes meloxicam. No diplopia. She did have a burning feeling at the throat consistent with acid reflux  Past Medical History:  Diagnosis Date   Anemia    Anxiety    Arthritis    Blood transfusion without reported diagnosis    Depression    Fibromyalgia 12/29/2013   Hyperplastic colon polyp    Migraines    Neuromuscular disorder (HCC)    Patent foramen ovale    Use filter on all IV fluids due to PFO   Post-operative nausea and vomiting    Special circumstances    Use filter on all IV tubing due to PFO   Thyroid disease    Vitamin D deficiency     Past Surgical History:  Procedure Laterality Date   ABDOMINAL HYSTERECTOMY     COLONOSCOPY  04/06/2009   ENDOVENOUS ABLATION SAPHENOUS VEIN W/ LASER Left 2010   Approx 2010 per patient, Has LLE EVLT at [facility] in [place], had a reaction to Tumescent??? given during the procedure, could not follow through with procedure on Right leg.   KNEE ARTHROSCOPY     right knee/arthroscopic   TONSILLECTOMY     UPPER GASTROINTESTINAL ENDOSCOPY  2015    Allergies as  of 09/12/2020       Reactions   Bupropion Hcl    REACTION: hives   Codeine    REACTION: nausea and vomiting        Medication List        Accurate as of September 12, 2020 11:59 PM. If you have any questions, ask your nurse or doctor.          STOP taking these medications    brompheniramine-pseudoephedrine-DM 30-2-10 MG/5ML syrup Stopped by: Kathlene November, MD       TAKE these medications    cholecalciferol 25 MCG (1000 UNIT) tablet Commonly known as: VITAMIN D3 Take 2,000 Units by mouth daily.   FLUoxetine 10 MG capsule Commonly known as: PROZAC Take 1 capsule (10 mg total) by mouth daily.   gabapentin 100 MG capsule Commonly known as: NEURONTIN Take 2 capsules (200 mg total) by mouth at bedtime.   levothyroxine 75 MCG tablet Commonly known as: SYNTHROID Take 1 tablet (75 mcg total) by mouth daily. What changed: Another medication with the same name was removed. Continue taking this medication, and follow the directions you see here. Changed by: Kathlene November, MD   meloxicam 15 MG tablet Commonly known as: MOBIC Take 1 tablet (15 mg total) by mouth daily.   valACYclovir 1000 MG  tablet Commonly known as: VALTREX Take 2 gm once, repeat 12 hours later. Use as needed for cold sore           Objective:   Physical Exam BP 130/78 (BP Location: Left Arm, Patient Position: Sitting, Cuff Size: Normal)   Pulse 65   Temp 97.8 F (36.6 C) (Oral)   Resp 18   Ht 5' 6.5" (1.689 m)   Wt 192 lb 9.6 oz (87.4 kg)   SpO2 100%   BMI 30.62 kg/m  General:   Well developed, NAD, BMI noted. HEENT:  Normocephalic . Face symmetric. Neck: Full range of motion, no TTP of the cervical spine. Lungs:  CTA B Normal respiratory effort, no intercostal retractions, no accessory muscle use. Heart: RRR,  no murmur.  Lower extremities: no pretibial edema bilaterally  Skin: Not pale. Not jaundice Neurologic:  alert & oriented X3.  Speech normal, gait appropriate for age and  unassisted. Motor and DTR symmetric. EOMI Psych--  Cognition and judgment appear intact.  Cooperative with normal attention span and concentration.  Behavior appropriate. No anxious or depressed appearing.      Assessment     65 year old female, PMH includes migraines, thyroid disease, PFO, DJD, presents with:   Headache: new problem  Essentially new onset of headache for 8 days, associated with some nausea, some neck stiffness. Headache is better today, neuro exam nonfocal, plan: Stat CT. See PCP next week. Even if CT is normal, she is instructed to call or go to the ER if headache or shortness of is severe. Elevated BP:new problem BP today is normal, no history of hypertension, last BMP satisfactory, admit to increased sodium intake lately.  Plan: Monitor BPs, avoid excessive sodium, stop meloxicam and try Tylenol for now. GERD: Had transient GERD symptoms last week, took OTC PPIs, now asymptomatic.  No chest pain.   This visit occurred during the SARS-CoV-2 public health emergency.  Safety protocols were in place, including screening questions prior to the visit, additional usage of staff PPE, and extensive cleaning of exam room while observing appropriate contact time as indicated for disinfecting solutions.

## 2020-10-02 ENCOUNTER — Ambulatory Visit: Payer: Managed Care, Other (non HMO) | Admitting: Family Medicine

## 2020-10-02 ENCOUNTER — Encounter: Payer: Self-pay | Admitting: Family Medicine

## 2020-10-02 ENCOUNTER — Other Ambulatory Visit: Payer: Self-pay

## 2020-10-02 DIAGNOSIS — G5701 Lesion of sciatic nerve, right lower limb: Secondary | ICD-10-CM | POA: Diagnosis not present

## 2020-10-02 DIAGNOSIS — M7061 Trochanteric bursitis, right hip: Secondary | ICD-10-CM | POA: Diagnosis not present

## 2020-10-02 DIAGNOSIS — M48062 Spinal stenosis, lumbar region with neurogenic claudication: Secondary | ICD-10-CM

## 2020-10-02 NOTE — Assessment & Plan Note (Signed)
Patient given injection.  Tolerated the procedure well.  Discussed icing regimen.  See how patient responds.  I do think the differential includes a bar radiculopathy and will need to continue to monitor.  Awaiting more imaging from outside facility.

## 2020-10-02 NOTE — Assessment & Plan Note (Signed)
History of spinal stenosis.  We will continue to monitor but will need to consider the possibility of epidural or nerve root injections depending on how patient responds to the piriformis injection.

## 2020-10-02 NOTE — Patient Instructions (Signed)
Injected R piriformis See if you can get report and discs of MRIs in case we need back injection Hopefully this injection helps Have appt in 2 months

## 2020-10-02 NOTE — Progress Notes (Signed)
Kimberly Kent [place] Phone: [phone] Subjective:    I'm seeing this patient by the request  of:  Copland, Gay Filler, MD  CC: left foot pain   [id number]  08/21/2020 Arthritic changes but improved since the injection.  We will continue to monitor.  Could be a candidate for possible viscosupplementation.  See me again in 6 to 8 weeks otherwise.  Repeat injection given today.  Patient is traveling and hopefully this will be beneficial.  Has had this injected previously but thinks is more of a compensatory mechanism for more of the arthritic changes in the knee.  Follow-up with me again in 6 to 8 weeks  Continues to have some mild muscle strain noted.  Discussed potential thigh compression that I think would be beneficial in the intermittent meloxicam.  Follow-up if continuing to have difficulty could consider the possibility of MRI of the pelvis to further evaluate but hopefully patient will do relatively well.   Update 10/02/2020 Kimberly Kent is a 65 y.o. female coming in with complaint of L foot, R hip and R knee pain. Left foot is all better, but the Right hip is in a lot of pain is keeping her up all night and cannot get comfortable.  Patient states that it seems to be more in the buttocks area than anywhere else.  Patient has had back pain with this previously and has seen some Arin previously for the back pain.  Seems to be more localized to the buttocks area more than anywhere else.    Reviewed patient's previous imaging including MRI of the lumbar spine from 2013.  Found to have facet arthropathy at L4-L5.  Patient states that she has Imaging since then.  Patient's last x-rays were in April 2022 showing degenerative disc disease from L1-L4 and posterior facet arthropathy from L4-S1.  Past Medical History:  Diagnosis Date   Anemia    Anxiety    Arthritis    Blood transfusion without reported diagnosis    Depression     Fibromyalgia 12/29/2013   Hyperplastic colon polyp    Migraines    Neuromuscular disorder ([place])    Patent foramen ovale    Use filter on all IV fluids due to PFO   Post-operative nausea and vomiting    Special circumstances    Use filter on all IV tubing due to PFO   Thyroid disease    Vitamin D deficiency    Past Surgical History:  Procedure Laterality Date   ABDOMINAL HYSTERECTOMY     COLONOSCOPY  04/06/2009   ENDOVENOUS ABLATION SAPHENOUS VEIN W/ LASER Left 2010   Approx 2010 per patient, Has LLE EVLT at [facility] in [place], had a reaction to Tumescent??? given during the procedure, could not follow through with procedure on Right leg.   KNEE ARTHROSCOPY     right knee/arthroscopic   TONSILLECTOMY     UPPER GASTROINTESTINAL ENDOSCOPY  2015   Social History   Socioeconomic History   Marital status: Married    Spouse name: Not on file   Number of children: Not on file   Years of education: Not on file   Highest education level: Not on file  Occupational History   Occupation: mortgage industry Advertising copywriter  Tobacco Use   Smoking status: Never   Smokeless tobacco: Never  Vaping Use   Vaping Use: Never used  Substance and Sexual Activity   Alcohol use: Yes  Comment: occ   Drug use: No   Sexual activity: Not on file  Other Topics Concern   Not on file  Social History Narrative   Not on file   Social Determinants of Health   Financial Resource Strain: Not on file  Food Insecurity: Not on file  Transportation Needs: Not on file  Physical Activity: Not on file  Stress: Not on file  Social Connections: Not on file   Allergies  Allergen Reactions   Bupropion Hcl     REACTION: hives   Codeine     REACTION: nausea and vomiting   Family History  Problem Relation Age of Onset   Cancer Mother    Cancer Father    Diabetes Father    Parkinson's disease Maternal Grandmother    Cancer Maternal Grandfather    Colon cancer Neg Hx    Colon polyps Neg Hx     Esophageal cancer Neg Hx    Rectal cancer Neg Hx    Stomach cancer Neg Hx     Current Outpatient Medications (Endocrine & Metabolic):    levothyroxine (SYNTHROID) 75 MCG tablet, Take 1 tablet (75 mcg total) by mouth daily.    Current Outpatient Medications (Analgesics):    meloxicam (MOBIC) 15 MG tablet, Take 1 tablet (15 mg total) by mouth daily.   Current Outpatient Medications (Other):    cholecalciferol (VITAMIN D3) 25 MCG (1000 UNIT) tablet, Take 2,000 Units by mouth daily.   FLUoxetine (PROZAC) 10 MG capsule, Take 1 capsule (10 mg total) by mouth daily.   gabapentin (NEURONTIN) 100 MG capsule, Take 2 capsules (200 mg total) by mouth at bedtime.   valACYclovir (VALTREX) 1000 MG tablet, Take 2 gm once, repeat 12 hours later. Use as needed for cold sore   Reviewed prior external information including notes and imaging from  primary care provider As well as notes that were available from care everywhere and other healthcare systems.  Past medical history, social, surgical and family history all reviewed in electronic medical record.  No pertanent information unless stated regarding to the chief complaint.   Review of Systems:  No headache, visual changes, nausea, vomiting, diarrhea, constipation, dizziness, abdominal pain, skin rash, fevers, chills, night sweats, weight loss, swollen lymph nodes, body aches, joint swelling, chest pain, shortness of breath, mood changes. POSITIVE muscle aches  Objective  Blood pressure 100/64, pulse 68, height 5' 6.5" (1.689 m), weight 190 lb (86.2 kg), SpO2 98 %.   General: No apparent distress alert and oriented x3 mood and affect normal, dressed appropriately.  HEENT: Pupils equal, extraocular movements intact  Respiratory: Patient's speak in full sentences and does not appear short of breath  Cardiovascular: No lower extremity edema, non tender, no erythema  Gait normal with good balance and coordination.  MSK: Patient still has some  mild pain over the greater trochanteric area but worsening pain of the piriformis.  Still tightness of the hip noted.  Right knee exam still has arthritic changes.  Back  exam does have some mild loss of lordosis as well.  Tightness with FABER test.  After verbal consent patient was prepped with alcohol swab and with a 21-gauge 2 inch needle injected into the right piriformis more medially.  Total 1 cc of 0.5% Marcaine and 1 cc of Kenalog 40 mg/mL used.  No blood loss.  Band-Aid placed.  Postinjection instructions given    Impression and Recommendations:     The above documentation has been reviewed and is accurate and complete  Lyndal Pulley, DO

## 2020-10-02 NOTE — Assessment & Plan Note (Signed)
Patient continues to have some discomfort and feels that the last injection was not helpful.  Attempted a piriformis injection.  If continuing to have difficulty will need to consider the possibility of advanced imaging again.  Patient is going to try to get as the images from outside facility.  We will discuss otherwise.

## 2020-10-29 ENCOUNTER — Encounter: Payer: Self-pay | Admitting: Family Medicine

## 2020-11-03 ENCOUNTER — Other Ambulatory Visit: Payer: Self-pay

## 2020-11-03 ENCOUNTER — Telehealth: Payer: Self-pay | Admitting: Family Medicine

## 2020-11-03 MED ORDER — PREDNISONE 20 MG PO TABS: 40.0000 mg | ORAL_TABLET | Freq: Every day | ORAL | 0 refills | Status: DC

## 2020-11-03 NOTE — Telephone Encounter (Signed)
Pt called, back pain is worse than before. With her work schedule she can only come in after 230, so booked her with Dr. Georgina Snell for Monday.  She wanted Dr. Tamala Julian to know about her back pain and wondered if he would be willing to call in steroids.

## 2020-11-03 NOTE — Telephone Encounter (Signed)
Rx called in and patient notified.

## 2020-11-06 ENCOUNTER — Other Ambulatory Visit: Payer: Self-pay

## 2020-11-06 ENCOUNTER — Ambulatory Visit: Payer: Managed Care, Other (non HMO) | Admitting: Family Medicine

## 2020-11-06 ENCOUNTER — Ambulatory Visit: Payer: Self-pay

## 2020-11-06 VITALS — BP 102/76 | HR 76 | Ht 66.5 in | Wt 176.6 lb

## 2020-11-06 DIAGNOSIS — M533 Sacrococcygeal disorders, not elsewhere classified: Secondary | ICD-10-CM | POA: Diagnosis not present

## 2020-11-06 DIAGNOSIS — G5701 Lesion of sciatic nerve, right lower limb: Secondary | ICD-10-CM | POA: Diagnosis not present

## 2020-11-06 DIAGNOSIS — G8929 Other chronic pain: Secondary | ICD-10-CM | POA: Diagnosis not present

## 2020-11-06 NOTE — Progress Notes (Signed)
I, Peterson Lombard, LAT, ATC acting as a scribe for Lynne Leader, MD.  Kimberly Kent is a 65 y.o. female who presents to [place] [facility] today for back pain. Pt was previously seen by Dr. Tamala Julian on 10/02/20 L foot, R GT bursitis, R-sided piriformis syndrome, and R knee pain, and was given a R piriformis steroid injection. Pt exchanged MyChart messages w/ Dr. Donald Siva and was prescribed a 5 day course of prednisone on 11/03/20, but she has not started taking. Today, pt has been picking up her dog a lot. Pt locates pain to the R-side of her low back to bilat flanks  Radiating pain: no LE numbness/tingling: no LE weakness: no- was prior, but has resolved Aggravates: back ext Treatments tried: IBU, heating pad, chiro  Dx imaging: 06/15/20 L-spine XR  04/02/18 L-spine XR  07/13/12 L-spine MRI  Pertinent review of systems: No fevers or chills  Relevant historical information: History of trochanteric bursitis.  History of lumbar fusion.   Exam:  BP 102/76   Pulse 76   Ht 5' 6.5" (1.689 m)   Wt 176 lb 9.6 oz (80.1 kg)   SpO2 97%   BMI 28.08 kg/m  General: Well Developed, well nourished, and in no acute distress.   MSK: L-spine normal-appearing nontender midline.  Tender palpation right SI joint.  Decreased lumbar motion.    Lab and Radiology Results  Procedure: Real-time Ultrasound Guided Injection of right SI joint Device: Philips Affiniti 50G Images permanently stored and available for review in PACS Verbal informed consent obtained.  Discussed risks and benefits of procedure. Warned about infection bleeding damage to structures skin hypopigmentation and fat atrophy among others. Patient expresses understanding and agreement Time-out conducted.   Noted no overlying erythema, induration, or other signs of local infection.   Skin prepped in a sterile fashion.   Local anesthesia: Topical Ethyl chloride.   With sterile technique and under real time  ultrasound guidance: 40 mg of Kenalog and 2 mL of Marcaine injected into SI joint. Fluid seen entering the joint capsule.   Completed without difficulty   Pain moderately resolved suggesting accurate placement of the medication.   Advised to call if fevers/chills, erythema, induration, drainage, or persistent bleeding.   Images permanently stored and available for review in the ultrasound unit.  Impression: Technically successful ultrasound guided injection.     Assessment and Plan: 65 y.o. female with right SI joint pain.  Pain predominantly due to SI joint dysfunction as well as muscle spasm and dysfunction.  Discussed options.  Plan for SI joint injection.  Would recommend referral to physical therapy but she notes that she works 2 jobs and can take the time off right now to attend PT.  Recommend some home exercise program previously taught.  Recommend also heating pad.  Continue gabapentin as needed.  Recheck with myself or Dr. Tamala Julian as needed. Recommend holding off on the prednisone and Mestinon better or worsening.   PDMP not reviewed this encounter. Orders Placed This Encounter  Procedures   [place] LIMITED JOINT SPACE STRUCTURES LOW RIGHT(NO LINKED CHARGES)    Standing Status:   Future    Number of Occurrences:   1    Standing Expiration Date:   05/06/2021    Order Specific Question:   Reason for Exam (SYMPTOM  OR DIAGNOSIS REQUIRED)    Answer:   right si joint    Order Specific Question:   Preferred imaging location?    Answer:  [facility]   No orders of the defined types were placed in this encounter.    Discussed warning signs or symptoms. Please see discharge instructions. Patient expresses understanding.   The above documentation has been reviewed and is accurate and complete Lynne Leader, M.D.

## 2020-11-06 NOTE — Patient Instructions (Signed)
Thank you for coming in today.   Call or go to the ER if you develop a large red swollen joint with extreme pain or oozing puss.    Take the prednisone in a few days if you need to.  Dont take it if you dont need to.   Let dr Tamala Julian or me know if you have a problem.

## 2020-11-21 ENCOUNTER — Encounter: Payer: Self-pay | Admitting: Gastroenterology

## 2020-11-21 ENCOUNTER — Other Ambulatory Visit: Payer: Self-pay

## 2020-11-21 ENCOUNTER — Other Ambulatory Visit (INDEPENDENT_AMBULATORY_CARE_PROVIDER_SITE_OTHER): Payer: Managed Care, Other (non HMO)

## 2020-11-21 ENCOUNTER — Ambulatory Visit (INDEPENDENT_AMBULATORY_CARE_PROVIDER_SITE_OTHER): Payer: Managed Care, Other (non HMO) | Admitting: Gastroenterology

## 2020-11-21 VITALS — BP 130/78 | HR 70 | Ht 66.5 in | Wt 169.5 lb

## 2020-11-21 DIAGNOSIS — Z86010 Personal history of colonic polyps: Secondary | ICD-10-CM | POA: Diagnosis not present

## 2020-11-21 DIAGNOSIS — K5900 Constipation, unspecified: Secondary | ICD-10-CM | POA: Diagnosis not present

## 2020-11-21 DIAGNOSIS — R194 Change in bowel habit: Secondary | ICD-10-CM | POA: Diagnosis not present

## 2020-11-21 DIAGNOSIS — R1011 Right upper quadrant pain: Secondary | ICD-10-CM

## 2020-11-21 LAB — BASIC METABOLIC PANEL
BUN: 10 mg/dL (ref 6–23)
CO2: 27 mEq/L (ref 19–32)
Calcium: 9.7 mg/dL (ref 8.4–10.5)
Chloride: 103 mEq/L (ref 96–112)
Creatinine, Ser: 0.63 mg/dL (ref 0.40–1.20)
GFR: 94.82 mL/min (ref >60.00)
Glucose, Bld: 87 mg/dL (ref 70–99)
Potassium: 4.1 mEq/L (ref 3.5–5.1)
Sodium: 138 mEq/L (ref 135–145)

## 2020-11-21 LAB — TSH: TSH: 0.4 u[IU]/mL (ref 0.35–5.50)

## 2020-11-21 LAB — PHOSPHORUS: Phosphorus: 3.6 mg/dL (ref 2.3–4.6)

## 2020-11-21 LAB — MAGNESIUM: Magnesium: 1.9 mg/dL (ref 1.5–2.5)

## 2020-11-21 NOTE — Progress Notes (Signed)
Chief Complaint:    Constipation, change in bowel habits, abdominal pain  HPI:     Patient is a 65 y.o. female with a history of chronic low back pain, PFO, anxiety, depression, fibromyalgia, thyroid disease, vitamin D deficiency, presenting [facility] for evaluation of abdominal pain and change in bowel habits.  She states she has had constipation for the last 2 months or so.  Started keto-like diet, avoiding sugar and carbohydrates, then onset of constipation. Has intentionally lost 25#. Has been drinking 64 oz water/day. No improvement with "smooth move" tea. No pushing/straining to have BM. Last BM was 7 days ago with aid of smooth move. Baseline is BM every 3 days or so. No hematochezia, melena.   Also with pain in the RUQ around to right mid back with a "knot" in the epigastrium for the last week or so. Pain lasts for a few hours, seemingly at random. No n/v/f/c.  Has had similar symptoms in the past with negative work-up as below.  - 03/2020: Normal CBC, CMP, TSH - 05/2019: CT abdomen/pelvis: Normal - 04/2019: RUQ [place]: Normal  Endoscopic History: - Colonoscopy (03/2009): Diminutive sigmoid hyperplastic polyp.  Repeat 10 years - EGD (08/2013): 2 cm linear erosion in the distal esophagus c/w grade 2 esophagitis (path: Reflux changes), normal stomach/duodenum - Colonoscopy (07/2019): 2 subcentimeter polyps (HP x1, SSP x1), benign rectal hyperplastic polyps, internal hemorrhoids, single sigmoid diverticulum.  Normal TI.  Repeat in 5 years    Review of systems:     No chest pain, no SOB, no fevers, no urinary sx   Past Medical History:  Diagnosis Date   Anemia    Anxiety    Arthritis    Blood transfusion without reported diagnosis    Depression    Fibromyalgia 12/29/2013   Hyperplastic colon polyp    Migraines    Neuromuscular disorder (HCC)    Patent foramen ovale    Use filter on all IV fluids due to PFO   Post-operative nausea and vomiting    Special  circumstances    Use filter on all IV tubing due to PFO   Thyroid disease    Vitamin D deficiency     Patient's surgical history, family medical history, social history, medications and allergies were all reviewed in Epic    Current Outpatient Medications  Medication Sig Dispense Refill   cholecalciferol (VITAMIN D3) 25 MCG (1000 UNIT) tablet Take 2,000 Units by mouth daily.     FLUoxetine (PROZAC) 10 MG capsule Take 1 capsule (10 mg total) by mouth daily. 90 capsule 3   gabapentin (NEURONTIN) 100 MG capsule Take 2 capsules (200 mg total) by mouth at bedtime. 180 capsule 2   levothyroxine (SYNTHROID) 75 MCG tablet Take 1 tablet (75 mcg total) by mouth daily. 90 tablet 3   meloxicam (MOBIC) 15 MG tablet Take 1 tablet (15 mg total) by mouth daily. 90 tablet 1   valACYclovir (VALTREX) 1000 MG tablet Take 2 gm once, repeat 12 hours later. Use as needed for cold sore 20 tablet 1   No current facility-administered medications for this visit.    Physical Exam:     BP 130/78   Pulse 70   Ht 5' 6.5" (1.689 m)   Wt 169 lb 8 oz (76.9 kg)   SpO2 97%   BMI 26.95 kg/m   GENERAL:  Pleasant female in NAD PSYCH: : Cooperative, normal affect EENT:  conjunctiva pink, mucous membranes moist, neck supple without masses CARDIAC:  RRR, no murmur heard, no peripheral edema PULM: Normal respiratory effort, lungs CTA bilaterally, no wheezing ABDOMEN: Mild TTP in RUQ without rebound or guarding.  No peritoneal signs.  Negative Murphy sign.  Nondistended, soft. No obvious masses, no hepatomegaly,  normal bowel sounds SKIN:  turgor, no lesions seen Musculoskeletal:  Normal muscle tone, normal strength NEURO: Alert and oriented x 3, no focal neurologic deficits   IMPRESSION and PLAN:    1) Constipation 2) Change in bowel habits New onset constipation after starting significant dietary modification.  Plan for the following: - Continue adequate hydration with at least 64 ounces of water/day -  MiraLAX purge prep followed by MiraLAX 1 cap/day and titrate to regular, soft stools without straining to have BM - Check BMP, magnesium, phosphorus, TSH - Had colonoscopy in 2021  3) RUQ pain - HIDA scan - Check liver enzymes - If above work-up unrevealing and symptoms persist, plan for EGD  4) History of colon polyps - Repeat colonoscopy in 2026 for ongoing polyp surveillance  I spent 30 minutes of time, including in depth chart review, independent review of results as outlined above, communicating results with the patient directly, face-to-face time with the patient, coordinating care, and ordering studies and medications as appropriate, and documentation.       [place] ,DO, FACG 11/21/2020, 9:11 AM

## 2020-11-21 NOTE — Patient Instructions (Addendum)
If you are age 63 or older, your body mass index should be between 23-30. Your Body mass index is 26.95 kg/m. If this is out of the aforementioned range listed, please consider follow up with your Primary Care Provider.  If you are age 47 or younger, your body mass index should be between 19-25. Your Body mass index is 26.95 kg/m. If this is out of the aformentioned range listed, please consider follow up with your Primary Care Provider.   __________________________________________________________  The [facility] providers would like to encourage you to use [facility] to communicate with providers for non-urgent requests or questions.  Due to long hold times on the telephone, sending your provider a message by [facility] may be a faster and more efficient way to get a response.  Please allow 48 business hours for a response.  Please remember that this is for non-urgent requests.   Please go to the lab on the 2nd floor [address] before you leave the office today.  _______________________________________________________ Dennis Bast will be contacted by the schedulers for a HIDA scan at [facility] (1st floor) on ___________. Please arrive 15 minutes prior to your scheduled appointment at  ___________________. Make certain not to have anything to eat or drink at least 6 hours prior to your test. Should this appointment date or time not work well for you, please call radiology scheduling at [phone].  _____________________________________________________________________ hepatobiliary (HIDA) scan is an imaging procedure used to diagnose problems in the liver, gallbladder and bile ducts. In the HIDA scan, a radioactive chemical or tracer is injected into a vein in your arm. The tracer is handled by the liver like bile. Bile is a fluid produced and excreted by your liver that helps your digestive system break down fats in the foods you eat. Bile is stored in your gallbladder and the gallbladder releases the  bile when you eat a meal. A special nuclear medicine scanner (gamma camera) tracks the flow of the tracer from your liver into your gallbladder and small intestine.  During your HIDA scan  You'll be asked to change into a hospital gown before your HIDA scan begins. Your health care team will position you on a table, usually on your back. The radioactive tracer is then injected into a vein in your arm.The tracer travels through your bloodstream to your liver, where it's taken up by the bile-producing cells. The radioactive tracer travels with the bile from your liver into your gallbladder and through your bile ducts to your small intestine.You may feel some pressure while the radioactive tracer is injected into your vein. As you lie on the table, a special gamma camera is positioned over your abdomen taking pictures of the tracer as it moves through your body. The gamma camera takes pictures continually for about an hour. You'll need to keep still during the HIDA scan. This can become uncomfortable, but you may find that you can lessen the discomfort by taking deep breaths and thinking about other things. Tell your health care team if you're uncomfortable. The radiologist will watch on a computer the progress of the radioactive tracer through your body. The HIDA scan may be stopped when the radioactive tracer is seen in the gallbladder and enters your small intestine. This typically takes about an hour. In some cases extra imaging will be performed if original images aren't satisfactory, if morphine is given to help visualize the gallbladder or if the medication CCK is given to look at the contraction of the gallbladder.  This test typically takes 2 hours to complete. ________________________________________________________________________  Dr Bryan Lemma  recommends that you complete a bowel purge (to clean out your bowels). Please do the following: Purchase a bottle of Miralax over the counter as well as a box  of 5 mg dulcolax tablets. Take 4 dulcolax tablets. Wait 1 hour. You will then drink 6-8 capfuls of Miralax mixed in an adequate amount of water/juice/gatorade (you may choose which of these liquids to drink) over the next 2-3 hours. You should expect results within 1 to 6 hours after completing the bowel purge. ____________________________________________________________  Thank you for choosing me and [facility].  Vito Cirigliano, D.O.

## 2020-11-23 ENCOUNTER — Other Ambulatory Visit (INDEPENDENT_AMBULATORY_CARE_PROVIDER_SITE_OTHER): Payer: Managed Care, Other (non HMO)

## 2020-11-23 DIAGNOSIS — R1011 Right upper quadrant pain: Secondary | ICD-10-CM | POA: Diagnosis not present

## 2020-11-23 LAB — HEPATIC FUNCTION PANEL
ALT: 12 U/L (ref 0–35)
AST: 16 U/L (ref 0–37)
Albumin: 4.3 g/dL (ref 3.5–5.2)
Alkaline Phosphatase: 48 U/L (ref 39–117)
Bilirubin, Direct: 0.1 mg/dL (ref 0.0–0.3)
Total Bilirubin: 0.5 mg/dL (ref 0.2–1.2)
Total Protein: 6.6 g/dL (ref 6.0–8.3)

## 2020-11-23 NOTE — Telephone Encounter (Signed)
Spoke with [facility] and LFT's can still be added on. Add-on lab form was faxed to the lab.

## 2020-11-25 NOTE — Progress Notes (Signed)
[place] [facility] [address] [place], [place] [address] [phone]  Date:  11/27/2020   Name:  Kimberly Kent   DOB:  1955/10/23   MRN:  [id number]  PCP:  Darreld Mclean, MD    Chief Complaint: Fatigue and GI issues (Pt c/o extreme fatigue, bad stomach and back pain. She says she intentionally lost 20 lbs but since then her stomach has not felt right. Pt would like to have labs drawn today. Pt was seen by GI on 11/21/20. She says she has had a couple of episodes of discomfort in the chest/ sides and back. )   History of Present Illness:  Kimberly Kent is a 65 y.o. very pleasant female patient who presents with the following:  Patient seen today with concern of not feeling well, abdominal pain and weight loss Most recent visit with myself was in February History of migraine headache, hypothyroidism, PFO, polyarthralgia/arthritis/lumbar stenosis  She saw her gastroenterologist about a week ago: 1) Constipation 2) Change in bowel habits New onset constipation after starting significant dietary modification.  Plan for the following: - Continue adequate hydration with at least 64 ounces of water/day - MiraLAX purge prep followed by MiraLAX 1 cap/day and titrate to regular, soft stools without straining to have BM - Check BMP, magnesium, phosphorus, TSH - Had colonoscopy in 2021 3) RUQ pain - HIDA scan - Check liver enzymes - If above work-up unrevealing and symptoms persist, plan for EGD 4) History of colon polyps - Repeat colonoscopy in 2026 for ongoing polyp surveillance  Lab work as above was done, on chart.  All normal She has been intentionally losing weight over the last 2 months- she has lost quickly and more easily than perhaps anticipated  She was not able to donate blood recently when she tried to- her hg generally runs in the 12s, I believe the [employer] cut off is 12.5  Wt Readings from Last 3 Encounters:   11/27/20 171 lb 3.2 oz (77.7 kg)  11/21/20 169 lb 8 oz (76.9 kg)  11/06/20 176 lb 9.6 oz (80.1 kg)  2/22 weight 186  She notes that she is feeling quite tired and has low energy No sx of depression She is not sleeping great due to pain in her mid back, in the bilateral posterior ribs  She has noted this pain for couple of months  History of PFO but no other cardiac history She notes a couple of episodes of chest pain over the last month or so- lasted 2-3 minutes while at rest and then resolved  No CP with her work at [employer]- a which is quite physically active No unusual shortness of breath    Patient Active Problem List   Diagnosis Date Noted   Piriformis syndrome of right side 10/02/2020   Right hamstring muscle strain 06/15/2020   Greater trochanteric bursitis of right hip 04/30/2018   Polyarthralgia 04/03/2018   Degenerative arthritis of right knee 04/03/2018   Lumbar spinal stenosis 04/03/2018   PFO (patent foramen ovale) 01/13/2018   Vitamin D deficiency 03/06/2014   Migraine with aura 05/20/2011   Hypothyroid 05/20/2011   SHORTNESS OF BREATH 12/22/2009   TRANSIENT VISUAL LOSS 07/13/2008    Past Medical History:  Diagnosis Date   Anemia    Anxiety    Arthritis    Blood transfusion without reported diagnosis    Depression    Fibromyalgia 12/29/2013  Hyperplastic colon polyp    Migraines    Neuromuscular disorder (HCC)    Patent foramen ovale    Use filter on all IV fluids due to PFO   Post-operative nausea and vomiting    Special circumstances    Use filter on all IV tubing due to PFO   Thyroid disease    Vitamin D deficiency     Past Surgical History:  Procedure Laterality Date   ABDOMINAL HYSTERECTOMY     COLONOSCOPY  04/06/2009   ENDOVENOUS ABLATION SAPHENOUS VEIN W/ LASER Left 2010   Approx 2010 per patient, Has LLE EVLT at [facility] in [place], had a reaction to Tumescent??? given during the procedure, could not follow through with  procedure on Right leg.   KNEE ARTHROSCOPY     right knee/arthroscopic   TONSILLECTOMY     UPPER GASTROINTESTINAL ENDOSCOPY  2015    Social History   Tobacco Use   Smoking status: Never   Smokeless tobacco: Never  Vaping Use   Vaping Use: Never used  Substance Use Topics   Alcohol use: Yes    Comment: occ   Drug use: No    Family History  Problem Relation Age of Onset   Cancer Mother    Cancer Father    Diabetes Father    Parkinson's disease Maternal Grandmother    Cancer Maternal Grandfather    Colon cancer Neg Hx    Colon polyps Neg Hx    Esophageal cancer Neg Hx    Rectal cancer Neg Hx    Stomach cancer Neg Hx     Allergies  Allergen Reactions   Bupropion Hcl     REACTION: hives   Codeine     REACTION: nausea and vomiting    Medication list has been reviewed and updated.  Current Outpatient Medications on File Prior to Visit  Medication Sig Dispense Refill   cholecalciferol (VITAMIN D3) 25 MCG (1000 UNIT) tablet Take 2,000 Units by mouth daily.     FLUoxetine (PROZAC) 10 MG capsule Take 1 capsule (10 mg total) by mouth daily. 90 capsule 3   gabapentin (NEURONTIN) 100 MG capsule Take 2 capsules (200 mg total) by mouth at bedtime. 180 capsule 2   levothyroxine (SYNTHROID) 75 MCG tablet Take 1 tablet (75 mcg total) by mouth daily. 90 tablet 3   meloxicam (MOBIC) 15 MG tablet Take 1 tablet (15 mg total) by mouth daily. 90 tablet 1   valACYclovir (VALTREX) 1000 MG tablet Take 2 gm once, repeat 12 hours later. Use as needed for cold sore 20 tablet 1   No current facility-administered medications on file prior to visit.    Review of Systems:  As per HPI- otherwise negative.   Physical Examination: Vitals:   11/27/20 1504  BP: 114/80  Pulse: 65  Resp: 18  Temp: 98.5 F (36.9 C)  SpO2: 98%   Vitals:   11/27/20 1504  Weight: 171 lb 3.2 oz (77.7 kg)  Height: 5\' 7"  (1.702 m)   Body mass index is 26.81 kg/m. Ideal Body Weight: Weight in (lb) to have  BMI = 25: 159.3  GEN: no acute distress.  Minimal overweight, looks well HEENT: Atraumatic, Normocephalic.  Ears and Nose: No external deformity. CV: RRR, No M/G/R. No JVD. No thrill. No extra heart sounds. PULM: CTA B, no wheezes, crackles, rhonchi. No retractions. No resp. distress. No accessory muscle use. ABD: S, NT, ND, +BS. No rebound. No HSM. EXTR: No c/c/e PSYCH: Normally interactive. Conversant.  She has tenderness over her posterior lower ribs bilaterally.  No redness or skin changes noted  EKG: Sinus rhythm with low voltage in chest leads.  Compared with EKG from 2019 no significant changes noted  Assessment and Plan: Other fatigue - Plan: CBC, VITAMIN D 25 Hydroxy (Vit-D Deficiency, Fractures), B12  Rib pain - Plan: DG Thoracic Spine 2 View, cyclobenzaprine (FLEXERIL) 10 MG tablet, CANCELED: DG Ribs Unilateral W/Chest Right, CANCELED: DG Ribs Unilateral Left  Chest pain, unspecified type - Plan: EKG 12-Lead  Need for influenza vaccination - Plan: Flu Vaccine QUAD 6+ mos PF IM (Fluarix Quad PF)  Patient today with a couple concerns.  She has had some intentional weight loss, but also notes that she has felt tired and low energy for the last couple of months.  She also had some GI habit changes including constipation and abdominal discomfort, GI is evaluating her and they plan a HIDA scan She also notes atypical chest pain. EKG is normal.  We will obtain treadmill test to further risk satisfy She will let me know if any changes in the meantime. She also notes pain in her posterior ribs bilaterally which is causing some difficulty with sleep.  We will obtain films of her ribs and thoracic spine, we will have her try Flexeril at bedtime  Flu shot given  Signed Lamar Blinks, MD  Received her films as below, message to patient DG Ribs Bilateral W/Chest  Result Date: 11/27/2020 CLINICAL DATA:  Bilateral lower rib pain EXAM: BILATERAL RIBS AND CHEST - 4+ VIEW COMPARISON:   Chest x-ray 02/10/2019 FINDINGS: Single-view chest demonstrates no focal opacity or pleural effusion. Normal heart size. No pneumothorax. Bilateral rib series demonstrates possible age indeterminate nondisplaced right seventh lateral rib fracture. No definite left-sided rib fracture is seen. IMPRESSION: 1. No radiographic evidence for acute cardiopulmonary abnormality. 2. Possible nondisplaced right seventh rib fracture Electronically Signed   By: Donavan Foil M.D.   On: 11/27/2020 17:27

## 2020-11-27 ENCOUNTER — Ambulatory Visit (HOSPITAL_BASED_OUTPATIENT_CLINIC_OR_DEPARTMENT_OTHER)
Admission: RE | Admit: 2020-11-27 | Discharge: 2020-11-27 | Disposition: A | Discharge status: Home / Self Care | Payer: Managed Care, Other (non HMO) | Source: Ambulatory Visit | Attending: Family Medicine | Admitting: Family Medicine

## 2020-11-27 ENCOUNTER — Encounter: Payer: Self-pay | Admitting: Family Medicine

## 2020-11-27 ENCOUNTER — Telehealth: Payer: Self-pay | Admitting: Family Medicine

## 2020-11-27 ENCOUNTER — Encounter (HOSPITAL_BASED_OUTPATIENT_CLINIC_OR_DEPARTMENT_OTHER): Payer: Self-pay

## 2020-11-27 ENCOUNTER — Other Ambulatory Visit: Payer: Self-pay | Admitting: Family Medicine

## 2020-11-27 ENCOUNTER — Ambulatory Visit: Payer: Managed Care, Other (non HMO) | Admitting: Family Medicine

## 2020-11-27 ENCOUNTER — Other Ambulatory Visit: Payer: Self-pay

## 2020-11-27 VITALS — BP 114/80 | HR 65 | Temp 98.5°F | Resp 18 | Ht 67.0 in | Wt 171.2 lb

## 2020-11-27 DIAGNOSIS — R5383 Other fatigue: Secondary | ICD-10-CM

## 2020-11-27 DIAGNOSIS — R079 Chest pain, unspecified: Secondary | ICD-10-CM | POA: Diagnosis not present

## 2020-11-27 DIAGNOSIS — Z23 Encounter for immunization: Secondary | ICD-10-CM | POA: Diagnosis not present

## 2020-11-27 DIAGNOSIS — R0781 Pleurodynia: Secondary | ICD-10-CM

## 2020-11-27 MED ORDER — CYCLOBENZAPRINE HCL 10 MG PO TABS: 5.0000 mg | ORAL_TABLET | Freq: Two times a day (BID) | ORAL | 0 refills | Status: DC | PRN

## 2020-11-27 NOTE — Telephone Encounter (Signed)
Error

## 2020-11-27 NOTE — Patient Instructions (Addendum)
It was good to see you today- I will be in touch with your labs and x-ray reports,  we will get a treadmill test to check on your heart Try the muscle relaxer as needed at bedtime- this can make you sleepy.  I hope it will help with your back pain  Let me know how this works for you

## 2020-11-28 ENCOUNTER — Encounter: Payer: Self-pay | Admitting: Family Medicine

## 2020-11-28 DIAGNOSIS — E559 Vitamin D deficiency, unspecified: Secondary | ICD-10-CM

## 2020-11-28 LAB — CBC
HCT: 35.7 % — ABNORMAL LOW (ref 36.0–46.0)
Hemoglobin: 12.1 g/dL (ref 12.0–15.0)
MCHC: 34 g/dL (ref 30.0–36.0)
MCV: 93.3 fl (ref 78.0–100.0)
Platelets: 228 10*3/uL (ref 150.0–400.0)
RBC: 3.83 Mil/uL — ABNORMAL LOW (ref 3.87–5.11)
RDW: 13.3 % (ref 11.5–15.5)
WBC: 5.9 10*3/uL (ref 4.0–10.5)

## 2020-11-28 LAB — VITAMIN B12: Vitamin B-12: 214 pg/mL (ref 211–911)

## 2020-11-28 LAB — VITAMIN D 25 HYDROXY (VIT D DEFICIENCY, FRACTURES): VITD: 17.97 ng/mL — ABNORMAL LOW (ref 30.00–100.00)

## 2020-11-28 MED ORDER — VITAMIN D3 1.25 MG (50000 UT) PO CAPS: ORAL_CAPSULE | ORAL | 0 refills | Status: DC

## 2020-12-01 ENCOUNTER — Encounter: Payer: Self-pay | Admitting: Family Medicine

## 2020-12-01 NOTE — Progress Notes (Signed)
[place] [facility] [place] Phone: [phone] Subjective:   [name], am serving as a scribe for Dr. Hulan Saas. This visit occurred during the SARS-CoV-2 public health emergency.  Safety protocols were in place, including screening questions prior to the visit, additional usage of staff PPE, and extensive cleaning of exam room while observing appropriate contact time as indicated for disinfecting solutions.   I'm seeing this patient by the request  of:  Copland, Gay Filler, MD  CC: Knee pain follow-up  XBM:[id number]  10/02/2020 Patient given injection.  Tolerated the procedure well.  Discussed icing regimen.  See how patient responds.  I do think the differential includes a bar radiculopathy and will need to continue to monitor.  Awaiting more imaging from outside facility. Patient continues to have some discomfort and feels that the last injection was not helpful.  Attempted a piriformis injection.  If continuing to have difficulty will need to consider the possibility of advanced imaging again.  Patient is going to try to get as the images from outside facility.  We will discuss otherwise. History of spinal stenosis.  We will continue to monitor but will need to consider the possibility of epidural or nerve root injections depending on how patient responds to the piriformis injection.  Saw Dr. Georgina Snell 11/06/2020 65 y.o. female with right SI joint pain.  Pain predominantly due to SI joint dysfunction as well as muscle spasm and dysfunction.  Discussed options.  Plan for SI joint injection.  Would recommend referral to physical therapy but she notes that she works 2 jobs and can take the time off right now to attend PT.  Recommend some home exercise program previously taught.  Recommend also heating pad.  Continue gabapentin as needed.  Recheck with myself or Dr. Tamala Julian as needed. Recommend holding off on the prednisone and Mestinon  better or worsening.  Updated 12/04/2020 JOCILYN TREGO is a 65 y.o. female coming in with complaint of right side piriformis, knee and left foot pain which are doing better.Overall patient is feeling better as she has lost over 20#. Patient having pain in R ribs and had xray which showed fracture. No falls but has had pain in this area for a while.  Patient states that it has been more on the right side of flank.  Denies any hematuria.  Has had some difficulty with constipation recently.      Past Medical History:  Diagnosis Date   Anemia    Anxiety    Arthritis    Blood transfusion without reported diagnosis    Depression    Fibromyalgia 12/29/2013   Hyperplastic colon polyp    Migraines    Neuromuscular disorder ([place])    Patent foramen ovale    Use filter on all IV fluids due to PFO   Post-operative nausea and vomiting    Special circumstances    Use filter on all IV tubing due to PFO   Thyroid disease    Vitamin D deficiency    Past Surgical History:  Procedure Laterality Date   ABDOMINAL HYSTERECTOMY     COLONOSCOPY  04/06/2009   ENDOVENOUS ABLATION SAPHENOUS VEIN W/ LASER Left 2010   Approx 2010 per patient, Has LLE EVLT at [facility] in [place], had a reaction to Tumescent??? given during the procedure, could not follow through with procedure on Right leg.   KNEE ARTHROSCOPY     right knee/arthroscopic   TONSILLECTOMY  UPPER GASTROINTESTINAL ENDOSCOPY  2015   Social History   Socioeconomic History   Marital status: Married    Spouse name: Not on file   Number of children: Not on file   Years of education: Not on file   Highest education level: Not on file  Occupational History   Occupation: mortgage industry Advertising copywriter  Tobacco Use   Smoking status: Never   Smokeless tobacco: Never  Vaping Use   Vaping Use: Never used  Substance and Sexual Activity   Alcohol use: Yes    Comment: occ   Drug use: No   Sexual activity: Not on file  Other Topics  Concern   Not on file  Social History Narrative   Not on file   Social Determinants of Health   Financial Resource Strain: Not on file  Food Insecurity: Not on file  Transportation Needs: Not on file  Physical Activity: Not on file  Stress: Not on file  Social Connections: Not on file   Allergies  Allergen Reactions   Bupropion Hcl     REACTION: hives   Codeine     REACTION: nausea and vomiting   Family History  Problem Relation Age of Onset   Cancer Mother    Cancer Father    Diabetes Father    Parkinson's disease Maternal Grandmother    Cancer Maternal Grandfather    Colon cancer Neg Hx    Colon polyps Neg Hx    Esophageal cancer Neg Hx    Rectal cancer Neg Hx    Stomach cancer Neg Hx     Current Outpatient Medications (Endocrine & Metabolic):    levothyroxine (SYNTHROID) 75 MCG tablet, Take 1 tablet (75 mcg total) by mouth daily.    Current Outpatient Medications (Analgesics):    meloxicam (MOBIC) 15 MG tablet, Take 1 tablet (15 mg total) by mouth daily.   Current Outpatient Medications (Other):    Cholecalciferol (VITAMIN D3) 1.25 MG (50000 UT) CAPS, Take 1 weekly for 12 weeks   cholecalciferol (VITAMIN D3) 25 MCG (1000 UNIT) tablet, Take 2,000 Units by mouth daily.   cyclobenzaprine (FLEXERIL) 10 MG tablet, Take 0.5-1 tablets (5-10 mg total) by mouth 2 (two) times daily as needed for muscle spasms.   FLUoxetine (PROZAC) 10 MG capsule, Take 1 capsule (10 mg total) by mouth daily.   gabapentin (NEURONTIN) 100 MG capsule, Take 2 capsules (200 mg total) by mouth at bedtime.   valACYclovir (VALTREX) 1000 MG tablet, Take 2 gm once, repeat 12 hours later. Use as needed for cold sore   Reviewed prior external information including notes and imaging from  primary care provider As well as notes that were available from care everywhere and other healthcare systems.  Past medical history, social, surgical and family history all reviewed in electronic medical record.   No pertanent information unless stated regarding to the chief complaint.   Review of Systems:  No headache, visual changes, nausea, vomiting, diarrhea, constipation, dizziness, abdominal pain, skin rash, fevers, chills, night sweats, weight loss, swollen lymph nodes,  joint swelling, chest pain, shortness of breath, mood changes. POSITIVE muscle aches, body aches but improvement  Objective  Blood pressure 110/68, pulse 83, height 5\' 7"  (1.702 m), weight 170 lb (77.1 kg), SpO2 96 %.   General: No apparent distress alert and oriented x3 mood and affect normal, dressed appropriately.  Patient has lost weight. HEENT: Pupils equal, extraocular movements intact  Respiratory: Patient's speak in full sentences and does not appear short of  breath  Cardiovascular: No lower extremity edema, non tender, no erythema  Gait very minorly antalgic Patient does have some loss of lordosis.  Tightness noted with the FABER test.  Negative straight leg test. Knee exam still has some instability but very mild.  Patient does have good range of motion lacking the last 2 degrees of extension.   Impression and Recommendations:     The above documentation has been reviewed and is accurate and complete Lyndal Pulley, DO

## 2020-12-04 ENCOUNTER — Ambulatory Visit: Payer: Managed Care, Other (non HMO) | Admitting: Family Medicine

## 2020-12-04 ENCOUNTER — Other Ambulatory Visit: Payer: Self-pay

## 2020-12-04 ENCOUNTER — Encounter: Payer: Self-pay | Admitting: Family Medicine

## 2020-12-04 ENCOUNTER — Ambulatory Visit: Payer: Self-pay

## 2020-12-04 VITALS — BP 110/68 | HR 83 | Ht 67.0 in | Wt 170.0 lb

## 2020-12-04 DIAGNOSIS — M1711 Unilateral primary osteoarthritis, right knee: Secondary | ICD-10-CM | POA: Diagnosis not present

## 2020-12-04 DIAGNOSIS — M859 Disorder of bone density and structure, unspecified: Secondary | ICD-10-CM | POA: Diagnosis not present

## 2020-12-04 DIAGNOSIS — M79672 Pain in left foot: Secondary | ICD-10-CM | POA: Diagnosis not present

## 2020-12-04 DIAGNOSIS — S2231XA Fracture of one rib, right side, initial encounter for closed fracture: Secondary | ICD-10-CM

## 2020-12-04 DIAGNOSIS — M48062 Spinal stenosis, lumbar region with neurogenic claudication: Secondary | ICD-10-CM

## 2020-12-04 NOTE — Patient Instructions (Signed)
DEXA scan-[facility] [place] Mirilax 17g daily, Colace 100mg  daily for 10 days See me again in 2 months

## 2020-12-05 ENCOUNTER — Encounter: Payer: Self-pay | Admitting: Family Medicine

## 2020-12-05 DIAGNOSIS — S2239XA Fracture of one rib, unspecified side, initial encounter for closed fracture: Secondary | ICD-10-CM | POA: Insufficient documentation

## 2020-12-05 NOTE — Assessment & Plan Note (Signed)
Patient's right hand x-rays are showing isolated potential rib fracture on the right side.  This seems to be chronic.  We will get DEXA scan with patient having no other significant injury.  Think that this could be potentially old.  Differential includes potentially more of a lumbar pathology as well as potential constipation likely contributing to the pain.  We discussed this and home exercises.  Patient is slowly getting better.  Follow-up with me otherwise in 2 months.

## 2020-12-05 NOTE — Assessment & Plan Note (Signed)
Significant improvement at this time with patient having the weight loss.  Encourage patient to continue with this.  Discussed with patient that she should be proud with the results she is made so far.

## 2020-12-05 NOTE — Assessment & Plan Note (Signed)
Patient has known lumbar spinal stenosis.  We discussed with patient about the gabapentin still.  Patient's pain seems to be a little bit lower than where the rib fracture was noted.  I did review the images of the rib fracture and did appear to have a potential nondisplaced fracture that is likely chronic in nature.  We discussed that with patient not having any true injury that bone DEXA would be potentially beneficial.  Patient would like to do this to see why or if anything else is contributing.  We have done work-up previously of some lab work.  Patient does have an amazing primary care physician and is in good hands otherwise.  Follow-up with me again in 2 to 3 months.

## 2020-12-12 ENCOUNTER — Inpatient Hospital Stay: Admission: RE | Admit: 2020-12-12 | Payer: Managed Care, Other (non HMO) | Source: Ambulatory Visit

## 2020-12-18 ENCOUNTER — Encounter: Payer: Self-pay | Admitting: Family Medicine

## 2020-12-21 ENCOUNTER — Inpatient Hospital Stay: Admission: RE | Admit: 2020-12-21 | Payer: Managed Care, Other (non HMO) | Source: Ambulatory Visit

## 2020-12-26 ENCOUNTER — Ambulatory Visit (INDEPENDENT_AMBULATORY_CARE_PROVIDER_SITE_OTHER): Payer: Managed Care, Other (non HMO)

## 2020-12-26 ENCOUNTER — Other Ambulatory Visit: Payer: Self-pay | Admitting: Family Medicine

## 2020-12-26 ENCOUNTER — Other Ambulatory Visit: Payer: Self-pay

## 2020-12-26 DIAGNOSIS — F428 Other obsessive-compulsive disorder: Secondary | ICD-10-CM

## 2020-12-26 DIAGNOSIS — M48062 Spinal stenosis, lumbar region with neurogenic claudication: Secondary | ICD-10-CM

## 2020-12-26 DIAGNOSIS — R079 Chest pain, unspecified: Secondary | ICD-10-CM | POA: Diagnosis not present

## 2020-12-26 LAB — EXERCISE TOLERANCE TEST
Angina Index: 0
Duke Treadmill Score: 8
Estimated workload: 10.1
Exercise duration (min): 8 min
Exercise duration (sec): 0 s
MPHR: 158 {beats}/min
Peak HR: 139 {beats}/min
Percent HR: 87 %
RPE: 15
Rest HR: 61 {beats}/min
ST Depression (mm): 0.5 mm

## 2020-12-27 ENCOUNTER — Encounter: Payer: Self-pay | Admitting: Family Medicine

## 2020-12-28 ENCOUNTER — Other Ambulatory Visit: Payer: Self-pay

## 2020-12-28 ENCOUNTER — Ambulatory Visit (INDEPENDENT_AMBULATORY_CARE_PROVIDER_SITE_OTHER)
Admission: RE | Admit: 2020-12-28 | Discharge: 2020-12-28 | Disposition: A | Discharge status: Home / Self Care | Payer: Managed Care, Other (non HMO) | Source: Ambulatory Visit | Attending: Family Medicine | Admitting: Family Medicine

## 2020-12-28 DIAGNOSIS — M859 Disorder of bone density and structure, unspecified: Secondary | ICD-10-CM

## 2021-01-15 NOTE — Progress Notes (Deleted)
[facility] [address] [place] Phone: [phone] Subjective:    I'm seeing this patient by the request  of:  Copland, Gay Filler, MD  CC: Left foot.  [id number]  Kimberly Kent is a 65 y.o. female coming in with complaint of left foot pain still walking on a treadmill.      Past Medical History:  Diagnosis Date   Anemia    Anxiety    Arthritis    Blood transfusion without reported diagnosis    Depression    Fibromyalgia 12/29/2013   Hyperplastic colon polyp    Migraines    Neuromuscular disorder ([place])    Patent foramen ovale    Use filter on all IV fluids due to PFO   Post-operative nausea and vomiting    Special circumstances    Use filter on all IV tubing due to PFO   Thyroid disease    Vitamin D deficiency    Past Surgical History:  Procedure Laterality Date   ABDOMINAL HYSTERECTOMY     COLONOSCOPY  04/06/2009   ENDOVENOUS ABLATION SAPHENOUS VEIN W/ LASER Left 2010   Approx 2010 per patient, Has LLE EVLT at [facility] in [place], had a reaction to Tumescent??? given during the procedure, could not follow through with procedure on Right leg.   KNEE ARTHROSCOPY     right knee/arthroscopic   TONSILLECTOMY     UPPER GASTROINTESTINAL ENDOSCOPY  2015   Social History   Socioeconomic History   Marital status: Married    Spouse name: Not on file   Number of children: Not on file   Years of education: Not on file   Highest education level: Not on file  Occupational History   Occupation: mortgage industry Advertising copywriter  Tobacco Use   Smoking status: Never   Smokeless tobacco: Never  Vaping Use   Vaping Use: Never used  Substance and Sexual Activity   Alcohol use: Yes    Comment: occ   Drug use: No   Sexual activity: Not on file  Other Topics Concern   Not on file  Social History Narrative   Not on file   Social Determinants of Health   Financial Resource Strain: Not on file  Food Insecurity: Not  on file  Transportation Needs: Not on file  Physical Activity: Not on file  Stress: Not on file  Social Connections: Not on file   Allergies  Allergen Reactions   Bupropion Hcl     REACTION: hives   Codeine     REACTION: nausea and vomiting   Family History  Problem Relation Age of Onset   Cancer Mother    Cancer Father    Diabetes Father    Parkinson's disease Maternal Grandmother    Cancer Maternal Grandfather    Colon cancer Neg Hx    Colon polyps Neg Hx    Esophageal cancer Neg Hx    Rectal cancer Neg Hx    Stomach cancer Neg Hx     Current Outpatient Medications (Endocrine & Metabolic):    levothyroxine (SYNTHROID) 75 MCG tablet, Take 1 tablet (75 mcg total) by mouth daily.    Current Outpatient Medications (Analgesics):    meloxicam (MOBIC) 15 MG tablet, Take 1 tablet (15 mg total) by mouth daily.   Current Outpatient Medications (Other):    Cholecalciferol (VITAMIN D3) 1.25 MG (50000 UT) CAPS, Take 1 weekly for 12 weeks   cholecalciferol (VITAMIN D3) 25 MCG (1000 UNIT) tablet,  Take 2,000 Units by mouth daily.   cyclobenzaprine (FLEXERIL) 10 MG tablet, Take 0.5-1 tablets (5-10 mg total) by mouth 2 (two) times daily as needed for muscle spasms.   FLUoxetine (PROZAC) 10 MG capsule, TAKE 1 CAPSULE BY MOUTH EVERY DAY   gabapentin (NEURONTIN) 100 MG capsule, TAKE 2 CAPSULES BY MOUTH AT BEDTIME.   valACYclovir (VALTREX) 1000 MG tablet, Take 2 gm once, repeat 12 hours later. Use as needed for cold sore   Reviewed prior external information including notes and imaging from  primary care provider As well as notes that were available from care everywhere and other healthcare systems.  Past medical history, social, surgical and family history all reviewed in electronic medical record.  No pertanent information unless stated regarding to the chief complaint.   Review of Systems:  No headache, visual changes, nausea, vomiting, diarrhea, constipation, dizziness, abdominal  pain, skin rash, fevers, chills, night sweats, weight loss, swollen lymph nodes, body aches, joint swelling, chest pain, shortness of breath, mood changes. POSITIVE muscle aches  Objective  There were no vitals taken for this visit.   General: No apparent distress alert and oriented x3 mood and affect normal, dressed appropriately.  HEENT: Pupils equal, extraocular movements intact  Respiratory: Patient's speak in full sentences and does not appear short of breath  Cardiovascular: No lower extremity edema, non tender, no erythema  Gait antalgic  MSK:      Impression and Recommendations:     The above documentation has been reviewed and is accurate and complete Lyndal Pulley, DO

## 2021-01-15 NOTE — Progress Notes (Signed)
[facility] [place] Phone: [phone] Subjective:   [name], am serving as a scribe for Dr. Hulan Saas. This visit occurred during the SARS-CoV-2 public health emergency.  Safety protocols were in place, including screening questions prior to the visit, additional usage of staff PPE, and extensive cleaning of exam room while observing appropriate contact time as indicated for disinfecting solutions.   I'm seeing this patient by the request  of:  Copland, Gay Filler, MD  CC: left foot pain   [id number]  12/04/2020 Significant improvement at this time with patient having the weight loss.  Encourage patient to continue with this.  Discussed with patient that she should be proud with the results she is made so far. Patient has known lumbar spinal stenosis.  We discussed with patient about the gabapentin still.  Patient's pain seems to be a little bit lower than where the rib fracture was noted.  I did review the images of the rib fracture and did appear to have a potential nondisplaced fracture that is likely chronic in nature.  We discussed that with patient not having any true injury that bone DEXA would be potentially beneficial.  Patient would like to do this to see why or if anything else is contributing.  We have done work-up previously of some lab work.  Patient does have an amazing primary care physician and is in good hands otherwise.  Follow-up with me again in 2 to 3 months. Patient's right hand x-rays are showing isolated potential rib fracture on the right side.  This seems to be chronic.  We will get DEXA scan with patient having no other significant injury.  Think that this could be potentially old.  Differential includes potentially more of a lumbar pathology as well as potential constipation likely contributing to the pain.  We discussed this and home exercises.  Patient is slowly getting better.  Follow-up with  me otherwise in 2 months.  Updated 01/16/2021 RICKAYLA WIELAND is a 65 y.o. female coming in with complaint of left foot pain. Was wearing [employer] and after walking on treadmill felt some pain. Stepped off stoop and felt pop on inside of ankle. Pain radiating up into medial aspect of lower leg. Painful with DF and EV. Tried compression sleeve which made pain worse. Patient has been icing over medial aspect of tibia.   Would like recommendations as to what she can do with personal trainer.   Past Medical History:  Diagnosis Date   Anemia    Anxiety    Arthritis    Blood transfusion without reported diagnosis    Depression    Fibromyalgia 12/29/2013   Hyperplastic colon polyp    Migraines    Neuromuscular disorder ([place])    Patent foramen ovale    Use filter on all IV fluids due to PFO   Post-operative nausea and vomiting    Special circumstances    Use filter on all IV tubing due to PFO   Thyroid disease    Vitamin D deficiency    Past Surgical History:  Procedure Laterality Date   ABDOMINAL HYSTERECTOMY     COLONOSCOPY  04/06/2009   ENDOVENOUS ABLATION SAPHENOUS VEIN W/ LASER Left 2010   Approx 2010 per patient, Has LLE EVLT at [facility] in [place], had a reaction to Tumescent??? given during the procedure, could not follow through with procedure on Right leg.   KNEE ARTHROSCOPY  right knee/arthroscopic   TONSILLECTOMY     UPPER GASTROINTESTINAL ENDOSCOPY  2015   Social History   Socioeconomic History   Marital status: Married    Spouse name: Not on file   Number of children: Not on file   Years of education: Not on file   Highest education level: Not on file  Occupational History   Occupation: mortgage industry Advertising copywriter  Tobacco Use   Smoking status: Never   Smokeless tobacco: Never  Vaping Use   Vaping Use: Never used  Substance and Sexual Activity   Alcohol use: Yes    Comment: occ   Drug use: No   Sexual activity: Not on file  Other Topics  Concern   Not on file  Social History Narrative   Not on file   Social Determinants of Health   Financial Resource Strain: Not on file  Food Insecurity: Not on file  Transportation Needs: Not on file  Physical Activity: Not on file  Stress: Not on file  Social Connections: Not on file   Allergies  Allergen Reactions   Bupropion Hcl     REACTION: hives   Codeine     REACTION: nausea and vomiting   Family History  Problem Relation Age of Onset   Cancer Mother    Cancer Father    Diabetes Father    Parkinson's disease Maternal Grandmother    Cancer Maternal Grandfather    Colon cancer Neg Hx    Colon polyps Neg Hx    Esophageal cancer Neg Hx    Rectal cancer Neg Hx    Stomach cancer Neg Hx     Current Outpatient Medications (Endocrine & Metabolic):    levothyroxine (SYNTHROID) 75 MCG tablet, Take 1 tablet (75 mcg total) by mouth daily.    Current Outpatient Medications (Analgesics):    meloxicam (MOBIC) 15 MG tablet, Take 1 tablet (15 mg total) by mouth daily.   Current Outpatient Medications (Other):    Cholecalciferol (VITAMIN D3) 1.25 MG (50000 UT) CAPS, Take 1 weekly for 12 weeks   cholecalciferol (VITAMIN D3) 25 MCG (1000 UNIT) tablet, Take 2,000 Units by mouth daily.   cyclobenzaprine (FLEXERIL) 10 MG tablet, Take 0.5-1 tablets (5-10 mg total) by mouth 2 (two) times daily as needed for muscle spasms.   FLUoxetine (PROZAC) 10 MG capsule, TAKE 1 CAPSULE BY MOUTH EVERY DAY   gabapentin (NEURONTIN) 100 MG capsule, TAKE 2 CAPSULES BY MOUTH AT BEDTIME.   valACYclovir (VALTREX) 1000 MG tablet, Take 2 gm once, repeat 12 hours later. Use as needed for cold sore   Reviewed prior external information including notes and imaging from  primary care provider As well as notes that were available from care everywhere and other healthcare systems.  Past medical history, social, surgical and family history all reviewed in electronic medical record.  No pertanent information  unless stated regarding to the chief complaint.   Review of Systems:  No headache, visual changes, nausea, vomiting, diarrhea, constipation, dizziness, abdominal pain, skin rash, fevers, chills, night sweats, weight loss, swollen lymph nodes, body aches, joint swelling, chest pain, shortness of breath, mood changes. POSITIVE muscle aches  Objective  Blood pressure 116/74, pulse 71, height 5\' 7"  (1.702 m), weight 167 lb (75.8 kg), SpO2 97 %.   General: No apparent distress alert and oriented x3 mood and affect normal, dressed appropriately.  HEENT: Pupils equal, extraocular movements intact  Respiratory: Patient's speak in full sentences and does not appear short of breath  Cardiovascular:  No lower extremity edema, non tender, no erythema  Gait normal with good balance and coordination.  MSK: Left ankle exam shows some tenderness over the navicular bone.  Mild over the anterior aspect of the ankle.  Patient does have lacking some 5 degrees of dorsiflexion.  Mild positive Tinel's over the tarsal tunnel.  Patient does have a rigid midfoot.  Limited muscular skeletal ultrasound was performed and interpreted by Hulan Saas, M  Limited musculoskeletal ultrasound of patient's foot shows some mild arthritic changes of the midfoot.  Trace effusion noted of the ankle mortise.  Patient does have what appears to be partial tearing of the posterior tibialis tendon at the insertion of the navicular bone with questionable avulsion of the navicular bone. Impression: Arthritis with acute partial tear of the posterior tibialis and possible avulsion of the navicular.    Impression and Recommendations:     The above documentation has been reviewed and is accurate and complete Lyndal Pulley, DO

## 2021-01-16 ENCOUNTER — Ambulatory Visit: Payer: Managed Care, Other (non HMO) | Admitting: Family Medicine

## 2021-01-16 ENCOUNTER — Other Ambulatory Visit: Payer: Self-pay

## 2021-01-16 ENCOUNTER — Ambulatory Visit: Payer: Self-pay

## 2021-01-16 ENCOUNTER — Encounter: Payer: Self-pay | Admitting: Family Medicine

## 2021-01-16 ENCOUNTER — Ambulatory Visit (INDEPENDENT_AMBULATORY_CARE_PROVIDER_SITE_OTHER): Payer: Managed Care, Other (non HMO)

## 2021-01-16 VITALS — BP 116/74 | HR 71 | Ht 67.0 in | Wt 167.0 lb

## 2021-01-16 DIAGNOSIS — M25572 Pain in left ankle and joints of left foot: Secondary | ICD-10-CM | POA: Diagnosis not present

## 2021-01-16 NOTE — Patient Instructions (Addendum)
Low impact, no jumping or running for your personal trainer Xray today HOKA recovery sandals in house Continue Vit D K2 250mcg for 4 weeks Ok to bike for cardio or swim Machine weights only Listen to your body See me in 6 weeks

## 2021-01-16 NOTE — Assessment & Plan Note (Signed)
Concerned the patient may have a very small navicular avulsion fracture with some mild partial tearing at the insertion of the posterior tibialis tendon.  Discussed with patient about the potential for a cam walker which patient declined because she is concerned that it could cause the hip and the knee pain to be worsening pain patient also has a lumbar spinal stenosis.  Discussed with patient to wear proper shoes, avoid being barefoot, continue to once weekly vitamin D prescription that was given by the primary care provider as well as potentially a short course of K2 over-the-counter.  We discussed avoiding the high impact including walking on the treadmill at the moment.  Patient will follow up with me again in 4 to 6 weeks to make sure patient is healing appropriately and will progress patient's exercises thereafter.

## 2021-01-17 ENCOUNTER — Encounter: Payer: Self-pay | Admitting: Family Medicine

## 2021-01-17 MED ORDER — LEVOTHYROXINE SODIUM 75 MCG PO TABS: 75.0000 ug | ORAL_TABLET | Freq: Every day | ORAL | 1 refills | Status: DC

## 2021-01-21 ENCOUNTER — Other Ambulatory Visit: Payer: Self-pay | Admitting: Family Medicine

## 2021-01-21 DIAGNOSIS — F428 Other obsessive-compulsive disorder: Secondary | ICD-10-CM

## 2021-02-06 ENCOUNTER — Ambulatory Visit: Payer: Managed Care, Other (non HMO) | Admitting: Family Medicine

## 2021-02-12 ENCOUNTER — Other Ambulatory Visit: Payer: Self-pay | Admitting: Family Medicine

## 2021-02-12 ENCOUNTER — Encounter: Payer: Self-pay | Admitting: Family Medicine

## 2021-02-12 DIAGNOSIS — E559 Vitamin D deficiency, unspecified: Secondary | ICD-10-CM

## 2021-02-12 NOTE — Telephone Encounter (Signed)
Would you like Pt to continue Ergocalciferol?  

## 2021-02-24 ENCOUNTER — Encounter: Payer: Self-pay | Admitting: Family Medicine

## 2021-02-27 ENCOUNTER — Ambulatory Visit: Payer: Managed Care, Other (non HMO) | Admitting: Family Medicine

## 2021-03-05 NOTE — Telephone Encounter (Signed)
Transition was approved on 02/27/21.

## 2021-03-06 ENCOUNTER — Encounter: Payer: Self-pay | Admitting: Family Medicine

## 2021-03-07 ENCOUNTER — Other Ambulatory Visit: Payer: Self-pay

## 2021-03-07 DIAGNOSIS — M25572 Pain in left ankle and joints of left foot: Secondary | ICD-10-CM

## 2021-03-08 ENCOUNTER — Other Ambulatory Visit: Payer: Self-pay

## 2021-03-08 DIAGNOSIS — M79672 Pain in left foot: Secondary | ICD-10-CM

## 2021-03-08 DIAGNOSIS — M25572 Pain in left ankle and joints of left foot: Secondary | ICD-10-CM

## 2021-03-09 NOTE — Telephone Encounter (Signed)
New insurance: [employer] Member ID # [id number] Group # [id number] Customer Service Phone #[phone]

## 2021-03-10 ENCOUNTER — Encounter: Payer: Self-pay | Admitting: Family Medicine

## 2021-03-10 DIAGNOSIS — D649 Anemia, unspecified: Secondary | ICD-10-CM

## 2021-03-13 ENCOUNTER — Other Ambulatory Visit: Payer: Self-pay

## 2021-03-13 DIAGNOSIS — M79672 Pain in left foot: Secondary | ICD-10-CM

## 2021-03-14 ENCOUNTER — Encounter: Payer: Self-pay | Admitting: Family Medicine

## 2021-03-14 ENCOUNTER — Other Ambulatory Visit (INDEPENDENT_AMBULATORY_CARE_PROVIDER_SITE_OTHER): Payer: BLUE CROSS/BLUE SHIELD

## 2021-03-14 DIAGNOSIS — D649 Anemia, unspecified: Secondary | ICD-10-CM | POA: Diagnosis not present

## 2021-03-14 LAB — CBC
HCT: 35.8 % — ABNORMAL LOW (ref 36.0–46.0)
Hemoglobin: 12.3 g/dL (ref 12.0–15.0)
MCHC: 34.3 g/dL (ref 30.0–36.0)
MCV: 92.8 fl (ref 78.0–100.0)
Platelets: 222 10*3/uL (ref 150.0–400.0)
RBC: 3.86 Mil/uL — ABNORMAL LOW (ref 3.87–5.11)
RDW: 11.8 % (ref 11.5–15.5)
WBC: 3.7 10*3/uL — ABNORMAL LOW (ref 4.0–10.5)

## 2021-03-14 LAB — FERRITIN: Ferritin: 29.3 ng/mL (ref 10.0–291.0)

## 2021-03-15 ENCOUNTER — Other Ambulatory Visit: Payer: Managed Care, Other (non HMO)

## 2021-03-18 ENCOUNTER — Ambulatory Visit
Admission: RE | Admit: 2021-03-18 | Discharge: 2021-03-18 | Disposition: A | Discharge status: Home / Self Care | Payer: BLUE CROSS/BLUE SHIELD | Source: Ambulatory Visit | Attending: Family Medicine | Admitting: Family Medicine

## 2021-03-18 ENCOUNTER — Other Ambulatory Visit: Payer: Self-pay

## 2021-03-18 DIAGNOSIS — M65872 Other synovitis and tenosynovitis, left ankle and foot: Secondary | ICD-10-CM | POA: Diagnosis not present

## 2021-03-18 DIAGNOSIS — M25572 Pain in left ankle and joints of left foot: Secondary | ICD-10-CM

## 2021-03-18 DIAGNOSIS — M19072 Primary osteoarthritis, left ankle and foot: Secondary | ICD-10-CM | POA: Diagnosis not present

## 2021-03-18 DIAGNOSIS — M79672 Pain in left foot: Secondary | ICD-10-CM

## 2021-03-19 ENCOUNTER — Encounter: Payer: Self-pay | Admitting: Family Medicine

## 2021-03-22 ENCOUNTER — Encounter: Payer: Self-pay | Admitting: Family Medicine

## 2021-03-22 DIAGNOSIS — R052 Subacute cough: Secondary | ICD-10-CM

## 2021-03-22 MED ORDER — DOXYCYCLINE HYCLATE 100 MG PO CAPS: 100.0000 mg | ORAL_CAPSULE | Freq: Two times a day (BID) | ORAL | 0 refills | Status: DC

## 2021-03-27 NOTE — Progress Notes (Signed)
[facility] [facility] [address], [place], [place] [address] [phone]  Date:  03/28/2021   Name:  Kimberly Kent   DOB:  1955-10-22   MRN:  [id number]  PCP:  Darreld Mclean, MD    Chief Complaint: Cough (Persistent. Started Doxycycline on 03/27/21- has not completed yet. Has tried Mucinex DM, and spectrum. Both at home covid tests were negative. Has not picked up the script for Bromphen-Pse-Dm yet. )   History of Present Illness:  Kimberly Kent is a 66 y.o. very pleasant female patient who presents with the following:  Patient seen today with concern of illness Most recent visit with myself was in October- History of migraine headache, hypothyroidism, PFO, polyarthralgia/arthritis/lumbar stenosis  She contacted me on January 26 with concern of illness, she had noted a cough and sore throat She was negative for COVID-19 I called in doxycycline for her-however, she wrote in again yesterday with concern of continued cough and wheezing so we made appointment for today  Prozac 10 Gabapentin Levothyroxine  Lab Results  Component Value Date   TSH 0.40 11/21/2020   She has been taking doxy for about 5 days now Her sx started 10 days ago She may hear some wheezing when she lays down at night She feels like there is mucus in her chest/ throat but she cannot get it out Her mucus is still green when she coughs it up No fever noted Other than being "worn out from the cough" she is feeling well No GI symptoms   She is using some mucinex DM OTC- nothing else so far   Lab Results  Component Value Date   HGBA1C 5.2 03/30/2020   No history of DM   Patient Active Problem List   Diagnosis Date Noted   Left ankle pain 01/16/2021   Rib fracture 12/05/2020   Piriformis syndrome of right side 10/02/2020   Right hamstring muscle strain 06/15/2020   Greater trochanteric bursitis of right hip 04/30/2018   Polyarthralgia 04/03/2018    Degenerative arthritis of right knee 04/03/2018   Lumbar spinal stenosis 04/03/2018   PFO (patent foramen ovale) 01/13/2018   Vitamin D deficiency 03/06/2014   Migraine with aura 05/20/2011   Hypothyroid 05/20/2011   SHORTNESS OF BREATH 12/22/2009   TRANSIENT VISUAL LOSS 07/13/2008    Past Medical History:  Diagnosis Date   Anemia    Anxiety    Arthritis    Blood transfusion without reported diagnosis    Depression    Fibromyalgia 12/29/2013   Hyperplastic colon polyp    Migraines    Neuromuscular disorder ([place])    Patent foramen ovale    Use filter on all IV fluids due to PFO   Post-operative nausea and vomiting    Special circumstances    Use filter on all IV tubing due to PFO   Thyroid disease    Vitamin D deficiency     Past Surgical History:  Procedure Laterality Date   ABDOMINAL HYSTERECTOMY     COLONOSCOPY  04/06/2009   ENDOVENOUS ABLATION SAPHENOUS VEIN W/ LASER Left 2010   Approx 2010 per patient, Has LLE EVLT at [facility] in [place], had a reaction to Tumescent??? given during the procedure, could not follow through with procedure on Right leg.   KNEE ARTHROSCOPY     right knee/arthroscopic   TONSILLECTOMY     UPPER GASTROINTESTINAL ENDOSCOPY  2015    Social History  Tobacco Use   Smoking status: Never   Smokeless tobacco: Never  Vaping Use   Vaping Use: Never used  Substance Use Topics   Alcohol use: Yes    Comment: occ   Drug use: No    Family History  Problem Relation Age of Onset   Cancer Mother    Cancer Father    Diabetes Father    Parkinson's disease Maternal Grandmother    Cancer Maternal Grandfather    Colon cancer Neg Hx    Colon polyps Neg Hx    Esophageal cancer Neg Hx    Rectal cancer Neg Hx    Stomach cancer Neg Hx     Allergies  Allergen Reactions   Bupropion Hcl     REACTION: hives   Codeine     REACTION: nausea and vomiting    Medication list has been reviewed and updated.  Current Outpatient  Medications on File Prior to Visit  Medication Sig Dispense Refill   Cholecalciferol (VITAMIN D3) 1.25 MG (50000 UT) CAPS Take 1 weekly for 12 weeks 12 capsule 0   cholecalciferol (VITAMIN D3) 25 MCG (1000 UNIT) tablet Take 2,000 Units by mouth daily.     cyclobenzaprine (FLEXERIL) 10 MG tablet Take 0.5-1 tablets (5-10 mg total) by mouth 2 (two) times daily as needed for muscle spasms. 30 tablet 0   doxycycline (VIBRAMYCIN) 100 MG capsule Take 1 capsule (100 mg total) by mouth 2 (two) times daily. 20 capsule 0   FLUoxetine (PROZAC) 10 MG capsule TAKE 1 CAPSULE BY MOUTH EVERY DAY 90 capsule 1   gabapentin (NEURONTIN) 100 MG capsule TAKE 2 CAPSULES BY MOUTH AT BEDTIME. 180 capsule 2   levothyroxine (SYNTHROID) 75 MCG tablet Take 1 tablet (75 mcg total) by mouth daily before breakfast. 90 tablet 1   meloxicam (MOBIC) 15 MG tablet Take 1 tablet (15 mg total) by mouth daily. 90 tablet 1   valACYclovir (VALTREX) 1000 MG tablet Take 2 gm once, repeat 12 hours later. Use as needed for cold sore 20 tablet 1   No current facility-administered medications on file prior to visit.    Review of Systems:  As per HPI- otherwise negative.   Physical Examination: Vitals:   03/28/21 0855  BP: 120/80  Pulse: 67  Resp: 18  Temp: 97.9 F (36.6 C)  SpO2: 98%   Vitals:   03/28/21 0855  Weight: 171 lb 6.4 oz (77.7 kg)  Height: 5' 6.5" (1.689 m)   Body mass index is 27.25 kg/m. Ideal Body Weight: Weight in (lb) to have BMI = 25: 156.9  GEN: no acute distress. Mild overweight, looks well, coughing some  HEENT: Atraumatic, Normocephalic.  Ears and Nose: No external deformity. CV: RRR, No M/G/R. No JVD. No thrill. No extra heart sounds. PULM: CTA B, no wheezes, crackles, rhonchi. No retractions. No resp. distress. No accessory muscle use. ABD: S, NT, ND EXTR: No c/c/e PSYCH: Normally interactive. Conversant.    Assessment and Plan: Acute cough - Plan: predniSONE (DELTASONE) 20 MG tablet,  albuterol (VENTOLIN HFA) 108 (90 Base) MCG/ACT inhaler  Bronchospasm - Plan: predniSONE (DELTASONE) 20 MG tablet, albuterol (VENTOLIN HFA) 108 (90 Base) MCG/ACT inhaler  Wheezing - Plan: predniSONE (DELTASONE) 20 MG tablet, albuterol (VENTOLIN HFA) 108 (90 Base) MCG/ACT inhaler   Seen today with concern of cough and wheezing, otherwise feeling ok. I started her on doxycycline for her illness about 5 days ago Discussed possible SE of medication and how to use them She will alert me  if not feeling better in the next few days- Sooner if worse.    Signed Lamar Blinks, MD

## 2021-03-28 ENCOUNTER — Ambulatory Visit (INDEPENDENT_AMBULATORY_CARE_PROVIDER_SITE_OTHER): Payer: BLUE CROSS/BLUE SHIELD | Admitting: Family Medicine

## 2021-03-28 VITALS — BP 120/80 | HR 67 | Temp 97.9°F | Resp 18 | Ht 66.5 in | Wt 171.4 lb

## 2021-03-28 DIAGNOSIS — R062 Wheezing: Secondary | ICD-10-CM | POA: Diagnosis not present

## 2021-03-28 DIAGNOSIS — R051 Acute cough: Secondary | ICD-10-CM

## 2021-03-28 DIAGNOSIS — J9801 Acute bronchospasm: Secondary | ICD-10-CM | POA: Diagnosis not present

## 2021-03-28 MED ORDER — ALBUTEROL SULFATE HFA 108 (90 BASE) MCG/ACT IN AERS: 2.0000 | INHALATION_SPRAY | Freq: Four times a day (QID) | RESPIRATORY_TRACT | 2 refills | Status: DC | PRN

## 2021-03-28 MED ORDER — PREDNISONE 20 MG PO TABS: ORAL_TABLET | ORAL | 0 refills | Status: DC

## 2021-03-28 NOTE — Patient Instructions (Signed)
It was good to see you!   Prednisone 40 mg daily for 3 days, then 20 mg daily for 3 days Continue doxycycline Albuterol as needed Can use the cough syrup you have a home if you like Let me know if not getting better in the next few days- Sooner if worse.   We can get a chest film if you are not improving!

## 2021-04-07 NOTE — Addendum Note (Signed)
Addended by: Lamar Blinks C on: 04/07/2021 02:31 PM   Modules accepted: Orders

## 2021-04-09 ENCOUNTER — Encounter: Payer: Self-pay | Admitting: Family Medicine

## 2021-04-09 ENCOUNTER — Ambulatory Visit
Admission: RE | Admit: 2021-04-09 | Discharge: 2021-04-09 | Disposition: A | Discharge status: Home / Self Care | Payer: BLUE CROSS/BLUE SHIELD | Source: Ambulatory Visit | Attending: Family Medicine | Admitting: Family Medicine

## 2021-04-09 ENCOUNTER — Other Ambulatory Visit: Payer: Self-pay

## 2021-04-09 DIAGNOSIS — R052 Subacute cough: Secondary | ICD-10-CM

## 2021-04-13 NOTE — Progress Notes (Signed)
[place] Phone: [phone] Subjective:   Kimberly Kent, am serving as a scribe for Dr. Hulan Saas.  This visit occurred during the SARS-CoV-2 public health emergency.  Safety protocols were in place, including screening questions prior to the visit, additional usage of staff PPE, and extensive cleaning of exam room while observing appropriate contact time as indicated for disinfecting solutions.  I'm seeing this patient by the request  of:  Copland, Gay Filler, MD  CC: left foot pain   [id number]  01/16/2021 Concerned the patient may have a very small navicular avulsion fracture with some mild partial tearing at the insertion of the posterior tibialis tendon.  Discussed with patient about the potential for a cam walker which patient declined because she is concerned that it could cause the hip and the knee pain to be worsening pain patient also has a lumbar spinal stenosis.  Discussed with patient to wear proper shoes, avoid being barefoot, continue to once weekly vitamin D prescription that was given by the primary care provider as well as potentially a short course of K2 over-the-counter.  We discussed avoiding the high impact including walking on the treadmill at the moment.  Patient will follow up with me again in 4 to 6 weeks to make sure patient is healing appropriately and will progress patient's exercises thereafter.  Updated 04/17/2021 Kimberly Kent is a 66 y.o. female coming in with complaint of L foot pain. Patient states that she has burning pain in medial aspect of foot mostly with weight bearing. Unable to wear HOKA tennis shoes as this puts pressure on foot. Able to wear ECCO and HOKA sandals.   MRI IMPRESSION: 1. Mild tendinosis of the peroneus longus. Mild tendinosis of the peroneus brevis without a tear. Mild peroneal tenosynovitis. 2. 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       Past Medical History:  Diagnosis Date   Anemia    Anxiety    Arthritis    Blood transfusion without reported diagnosis    Depression    Fibromyalgia 12/29/2013   Hyperplastic colon polyp    Migraines    Neuromuscular disorder ([place])    Patent foramen ovale    Use filter on all IV fluids due to PFO   Post-operative nausea and vomiting    Special circumstances    Use filter on all IV tubing due to PFO   Thyroid disease    Vitamin D deficiency    Past Surgical History:  Procedure Laterality Date   ABDOMINAL HYSTERECTOMY     COLONOSCOPY  04/06/2009   ENDOVENOUS ABLATION SAPHENOUS VEIN W/ LASER Left 2010   Approx 2010 per patient, Has LLE EVLT at [facility] in [place], had a reaction to Tumescent??? given during the procedure, could not follow through with procedure on Right leg.   KNEE ARTHROSCOPY     right knee/arthroscopic   TONSILLECTOMY     UPPER GASTROINTESTINAL ENDOSCOPY  2015   Social History   Socioeconomic History   Marital status: Married    Spouse name: Not on file   Number of children: Not on file   Years of education: Not on file   Highest education level: Not on file  Occupational History   Occupation: mortgage industry Advertising copywriter  Tobacco Use   Smoking status: Never   Smokeless tobacco: Never  Vaping Use   Vaping Use: Never used  Substance  and Sexual Activity   Alcohol use: Yes    Comment: occ   Drug use: Kent   Sexual activity: Not on file  Other Topics Concern   Not on file  Social History Narrative   Not on file   Social Determinants of Health   Financial Resource Strain: Not on file  Food Insecurity: Not on file  Transportation Needs: Not on file  Physical Activity: Not on file  Stress: Not on file  Social Connections: Not on file   Allergies  Allergen Reactions   Bupropion Hcl     REACTION: hives   Codeine     REACTION: nausea and vomiting   Family History  Problem Relation  Age of Onset   Cancer Mother    Cancer Father    Diabetes Father    Parkinson's disease Maternal Grandmother    Cancer Maternal Grandfather    Colon cancer Neg Hx    Colon polyps Neg Hx    Esophageal cancer Neg Hx    Rectal cancer Neg Hx    Stomach cancer Neg Hx     Current Outpatient Medications (Endocrine & Metabolic):    levothyroxine (SYNTHROID) 75 MCG tablet, Take 1 tablet (75 mcg total) by mouth daily before breakfast.   predniSONE (DELTASONE) 20 MG tablet, Take 40 mg daily for 3 days ,then 20 mg daily for 3 days   Current Outpatient Medications (Respiratory):    albuterol (VENTOLIN HFA) 108 (90 Base) MCG/ACT inhaler, Inhale 2 puffs into the lungs every 6 (six) hours as needed for wheezing or shortness of breath.  Current Outpatient Medications (Analgesics):    meloxicam (MOBIC) 15 MG tablet, Take 1 tablet (15 mg total) by mouth daily.   Current Outpatient Medications (Other):    Cholecalciferol (VITAMIN D3) 1.25 MG (50000 UT) CAPS, Take 1 weekly for 12 weeks   cholecalciferol (VITAMIN D3) 25 MCG (1000 UNIT) tablet, Take 2,000 Units by mouth daily.   cyclobenzaprine (FLEXERIL) 10 MG tablet, Take 0.5-1 tablets (5-10 mg total) by mouth 2 (two) times daily as needed for muscle spasms.   doxycycline (VIBRAMYCIN) 100 MG capsule, Take 1 capsule (100 mg total) by mouth 2 (two) times daily.   FLUoxetine (PROZAC) 10 MG capsule, TAKE 1 CAPSULE BY MOUTH EVERY DAY   gabapentin (NEURONTIN) 100 MG capsule, TAKE 2 CAPSULES BY MOUTH AT BEDTIME.   valACYclovir (VALTREX) 1000 MG tablet, Take 2 gm once, repeat 12 hours later. Use as needed for cold sore   Reviewed prior external information including notes and imaging from  primary care provider As well as notes that were available from care everywhere and other healthcare systems.  Past medical history, social, surgical and family history all reviewed in electronic medical record.  Kent pertanent information unless stated regarding to the  chief complaint.   Review of Systems:  Kent headache, visual changes, nausea, vomiting, diarrhea, constipation, dizziness, abdominal pain, skin rash, fevers, chills, night sweats, weight loss, swollen lymph nodes, body aches, joint swelling, chest pain, shortness of breath, mood changes. POSITIVE muscle aches  Objective  Blood pressure (!) 144/88, pulse 61, height 5' 6.5" (1.689 m), weight 173 lb (78.5 kg), SpO2 99 %.   General: Kent apparent distress alert and oriented x3 mood and affect normal, dressed appropriately.  HEENT: Pupils equal, extraocular movements intact  Respiratory: Patient's speak in full sentences and does not appear short of breath  Cardiovascular: Kent lower extremity edema, non tender, Kent erythema  Gait antalgic  MSK: Patient does have some  tenderness to palpation over the medial aspect of the foot.  Seems to be more over the navicular cuneiform joint.  Seems to be more on the inferior aspect of the foot.  Procedure: Real-time Ultrasound Guided Injection of naviculocuneiform joint Device: GE Logiq Q7 Ultrasound guided injection is preferred based studies that show increased duration, increased effect, greater accuracy, decreased procedural pain, increased response rate, and decreased cost with ultrasound guided versus blind injection.  Verbal informed consent obtained.  Time-out conducted.  Noted Kent overlying erythema, induration, or other signs of local infection.  Skin prepped in a sterile fashion.  Local anesthesia: Topical Ethyl chloride.  With sterile technique and under real time ultrasound guidance:  with a 25g 1/2 inch needle injected with a total of 0.5 cc of 0.5% Marcaine and 0.5 cc of Kenalog 40 mg/mL.  This approach was from the medial aspect Completed without difficulty  Pain immediately resolved suggesting accurate placement of the medication.  Advised to call if fevers/chills, erythema, induration, drainage, or persistent bleeding.  Impression: Technically  successful ultrasound guided injection.    Impression and Recommendations:     The above documentation has been reviewed and is accurate and complete Lyndal Pulley, DO

## 2021-04-17 ENCOUNTER — Ambulatory Visit: Payer: Self-pay

## 2021-04-17 ENCOUNTER — Other Ambulatory Visit: Payer: Self-pay

## 2021-04-17 ENCOUNTER — Ambulatory Visit: Payer: BLUE CROSS/BLUE SHIELD | Admitting: Family Medicine

## 2021-04-17 VITALS — BP 144/88 | HR 61 | Ht 66.5 in | Wt 173.0 lb

## 2021-04-17 DIAGNOSIS — M19079 Primary osteoarthritis, unspecified ankle and foot: Secondary | ICD-10-CM | POA: Diagnosis not present

## 2021-04-17 DIAGNOSIS — M79672 Pain in left foot: Secondary | ICD-10-CM | POA: Diagnosis not present

## 2021-04-17 NOTE — Assessment & Plan Note (Signed)
Patient given injection and tolerated the procedure well, discussed icing regimen and home exercise, discussed which activities to do and which ones to avoid.  Increase activity slowly.  Follow-up again in 6 to 8 weeks

## 2021-04-17 NOTE — Patient Instructions (Signed)
Injected foot today Any redness or swelling, call or seek medical attention Give it 3 days until you go to the gym [employer]  See me in 6 weeks

## 2021-04-18 DIAGNOSIS — Z1231 Encounter for screening mammogram for malignant neoplasm of breast: Secondary | ICD-10-CM | POA: Diagnosis not present

## 2021-04-18 LAB — HM MAMMOGRAPHY

## 2021-04-20 NOTE — Progress Notes (Signed)
[place] at [facility] [address], [place], [place] [address] [phone]  Date:  04/23/2021   Name:  Kimberly Kent   DOB:  1955-05-27   MRN:  [id number]  PCP:  Darreld Mclean, MD    Chief Complaint: Cough (Seen 03/28/21- Rx: prednisone 20 MG tablet and albuterol inhaler. She stopped the prednisone because she was experiencing some adverse reactions. Claritin nor Zyrtec has helped. She would like RSV and covid testing. )   History of Present Illness:  ESTEPHANI Kent is a 66 y.o. very pleasant female patient who presents with the following:   History of migraine headache, hypothyroidism, PFO, polyarthralgia/arthritis/lumbar stenosis  Pt seen today with a cough Last seen by myself 2/1-  Seen today with concern of cough and wheezing, otherwise feeling ok. I started her on doxycycline for her illness about 5 days ago Discussed possible SE of medication and how to use them She will alert me if not feeling better in the next few days- Sooner if worse.    She stopped using the prednisone as her throat and mouth were sore- her throat and mouth sx did resolve She notes she is still coughing up green mucus She feels tired still  She tried adding claritin for a few days- no change She also tried zyrtec- no change  Some family members and her partner  She finished up her doxy about 2 weeks ago She has had sx for about 5 weeks now total  She has tested herself for covid-negative She does not have a fever She notes nasal congestion No sneezing No GI symptoms  Never a smoker   Mammo- done at [facility] last week, normal per pt report  Pap- s/p hyst Shingirx   Patient Active Problem List   Diagnosis Date Noted   Arthritis of midfoot 04/17/2021   Left ankle pain 01/16/2021   Rib fracture 12/05/2020   Piriformis syndrome of right side 10/02/2020   Right hamstring muscle strain 06/15/2020   Greater trochanteric bursitis of right hip  04/30/2018   Polyarthralgia 04/03/2018   Degenerative arthritis of right knee 04/03/2018   Lumbar spinal stenosis 04/03/2018   PFO (patent foramen ovale) 01/13/2018   Vitamin D deficiency 03/06/2014   Migraine with aura 05/20/2011   Hypothyroid 05/20/2011   SHORTNESS OF BREATH 12/22/2009   TRANSIENT VISUAL LOSS 07/13/2008    Past Medical History:  Diagnosis Date   Anemia    Anxiety    Arthritis    Blood transfusion without reported diagnosis    Depression    Fibromyalgia 12/29/2013   Hyperplastic colon polyp    Migraines    Neuromuscular disorder ([place])    Patent foramen ovale    Use filter on all IV fluids due to PFO   Post-operative nausea and vomiting    Special circumstances    Use filter on all IV tubing due to PFO   Thyroid disease    Vitamin D deficiency     Past Surgical History:  Procedure Laterality Date   ABDOMINAL HYSTERECTOMY     COLONOSCOPY  04/06/2009   ENDOVENOUS ABLATION SAPHENOUS VEIN W/ LASER Left 2010   Approx 2010 per patient, Has LLE EVLT at [facility] in [place], had a reaction to Tumescent??? given during the procedure, could not follow through with procedure on Right leg.   KNEE ARTHROSCOPY     right knee/arthroscopic   TONSILLECTOMY     UPPER GASTROINTESTINAL ENDOSCOPY  2015    Social History   Tobacco Use   Smoking status: Never   Smokeless tobacco: Never  Vaping Use   Vaping Use: Never used  Substance Use Topics   Alcohol use: Yes    Comment: occ   Drug use: No    Family History  Problem Relation Age of Onset   Cancer Mother    Cancer Father    Diabetes Father    Parkinson's disease Maternal Grandmother    Cancer Maternal Grandfather    Colon cancer Neg Hx    Colon polyps Neg Hx    Esophageal cancer Neg Hx    Rectal cancer Neg Hx    Stomach cancer Neg Hx     Allergies  Allergen Reactions   Bupropion Hcl     REACTION: hives   Codeine     REACTION: nausea and vomiting    Medication list has been reviewed and  updated.  Current Outpatient Medications on File Prior to Visit  Medication Sig Dispense Refill   albuterol (VENTOLIN HFA) 108 (90 Base) MCG/ACT inhaler Inhale 2 puffs into the lungs every 6 (six) hours as needed for wheezing or shortness of breath. 18 g 2   Cholecalciferol (VITAMIN D3) 1.25 MG (50000 UT) CAPS Take 1 weekly for 12 weeks 12 capsule 0   cholecalciferol (VITAMIN D3) 25 MCG (1000 UNIT) tablet Take 2,000 Units by mouth daily.     cyclobenzaprine (FLEXERIL) 10 MG tablet Take 0.5-1 tablets (5-10 mg total) by mouth 2 (two) times daily as needed for muscle spasms. 30 tablet 0   doxycycline (VIBRAMYCIN) 100 MG capsule Take 1 capsule (100 mg total) by mouth 2 (two) times daily. 20 capsule 0   FLUoxetine (PROZAC) 10 MG capsule TAKE 1 CAPSULE BY MOUTH EVERY DAY 90 capsule 1   gabapentin (NEURONTIN) 100 MG capsule TAKE 2 CAPSULES BY MOUTH AT BEDTIME. 180 capsule 2   levothyroxine (SYNTHROID) 75 MCG tablet Take 1 tablet (75 mcg total) by mouth daily before breakfast. 90 tablet 1   meloxicam (MOBIC) 15 MG tablet Take 1 tablet (15 mg total) by mouth daily. 90 tablet 1   predniSONE (DELTASONE) 20 MG tablet Take 40 mg daily for 3 days ,then 20 mg daily for 3 days 9 tablet 0   valACYclovir (VALTREX) 1000 MG tablet Take 2 gm once, repeat 12 hours later. Use as needed for cold sore 20 tablet 1   No current facility-administered medications on file prior to visit.    Review of Systems:  As per HPI- otherwise negative.   Physical Examination: Vitals:   04/23/21 1031  BP: 134/82  Pulse: (!) 59  Resp: 18  Temp: 97.8 F (36.6 C)  SpO2: 99%   Vitals:   04/23/21 1031  Weight: 174 lb (78.9 kg)  Height: 5' 6.5" (1.689 m)   Body mass index is 27.66 kg/m. Ideal Body Weight: Weight in (lb) to have BMI = 25: 156.9  GEN: no acute distress. HEENT: Atraumatic, Normocephalic.  Ears and Nose: No external deformity. CV: RRR, No M/G/R. No JVD. No thrill. No extra heart sounds. PULM: Some  rhonchi noted left lower lobe. No retractions. No resp. distress. No accessory muscle use. ABD: S, NT, ND, +BS. No rebound. No HSM. EXTR: No c/c/e PSYCH: Normally interactive. Conversant.   Did a DuoNeb treatment-improvement of lung sounds Assessment and Plan: Subacute cough - Plan: CBC with Differential/Platelet, ipratropium-albuterol (DUONEB) 0.5-2.5 (3) MG/3ML nebulizer solution 3 mL, Respiratory virus panel, cefdinir (OMNICEF) 300 MG capsule  Patient seen today with persistent cough for about 5 weeks.  We have treated with 1 course of antibiotics, doxycycline She took part of a prednisone burst but stopped due to side effects Offered repeat chest x-ray, she declines for the time being We will treat with Omnicef for 10 days Test for RSV Patient will let me know if she is not noting improvement or resolution of symptoms  Signed Lamar Blinks, MD  Received her CBC as below, message to patient  Results for orders placed or performed in visit on 04/23/21  CBC with Differential/Platelet  Result Value Ref Range   WBC 6.3 4.0 - 10.5 K/uL   RBC 4.03 3.87 - 5.11 Mil/uL   Hemoglobin 12.6 12.0 - 15.0 g/dL   HCT 37.4 36.0 - 46.0 %   MCV 92.8 78.0 - 100.0 fl   MCHC 33.7 30.0 - 36.0 g/dL   RDW 12.5 11.5 - 15.5 %   Platelets 290.0 150.0 - 400.0 K/uL   Neutrophils Relative % 58.2 43.0 - 77.0 %   Lymphocytes Relative 28.6 12.0 - 46.0 %   Monocytes Relative 7.4 3.0 - 12.0 %   Eosinophils Relative 5.2 (H) 0.0 - 5.0 %   Basophils Relative 0.6 0.0 - 3.0 %   Neutro Abs 3.7 1.4 - 7.7 K/uL   Lymphs Abs 1.8 0.7 - 4.0 K/uL   Monocytes Absolute 0.5 0.1 - 1.0 K/uL   Eosinophils Absolute 0.3 0.0 - 0.7 K/uL   Basophils Absolute 0.0 0.0 - 0.1 K/uL

## 2021-04-23 ENCOUNTER — Encounter: Payer: Self-pay | Admitting: Family Medicine

## 2021-04-23 ENCOUNTER — Ambulatory Visit: Payer: BLUE CROSS/BLUE SHIELD | Admitting: Family Medicine

## 2021-04-23 VITALS — BP 134/82 | HR 59 | Temp 97.8°F | Resp 18 | Ht 66.5 in | Wt 174.0 lb

## 2021-04-23 DIAGNOSIS — R052 Subacute cough: Secondary | ICD-10-CM

## 2021-04-23 LAB — CBC WITH DIFFERENTIAL/PLATELET
Basophils Absolute: 0 10*3/uL (ref 0.0–0.1)
Basophils Relative: 0.6 % (ref 0.0–3.0)
Eosinophils Absolute: 0.3 10*3/uL (ref 0.0–0.7)
Eosinophils Relative: 5.2 % — ABNORMAL HIGH (ref 0.0–5.0)
HCT: 37.4 % (ref 36.0–46.0)
Hemoglobin: 12.6 g/dL (ref 12.0–15.0)
Lymphocytes Relative: 28.6 % (ref 12.0–46.0)
Lymphs Abs: 1.8 10*3/uL (ref 0.7–4.0)
MCHC: 33.7 g/dL (ref 30.0–36.0)
MCV: 92.8 fl (ref 78.0–100.0)
Monocytes Absolute: 0.5 10*3/uL (ref 0.1–1.0)
Monocytes Relative: 7.4 % (ref 3.0–12.0)
Neutro Abs: 3.7 10*3/uL (ref 1.4–7.7)
Neutrophils Relative %: 58.2 % (ref 43.0–77.0)
Platelets: 290 10*3/uL (ref 150.0–400.0)
RBC: 4.03 Mil/uL (ref 3.87–5.11)
RDW: 12.5 % (ref 11.5–15.5)
WBC: 6.3 10*3/uL (ref 4.0–10.5)

## 2021-04-23 MED ORDER — CEFDINIR 300 MG PO CAPS: 300.0000 mg | ORAL_CAPSULE | Freq: Two times a day (BID) | ORAL | 0 refills | Status: DC

## 2021-04-23 MED ORDER — IPRATROPIUM-ALBUTEROL 0.5-2.5 (3) MG/3ML IN SOLN
3.0000 mL | Freq: Once | RESPIRATORY_TRACT | Status: AC
  Administered 2021-04-23: 3 mL via RESPIRATORY_TRACT

## 2021-04-23 NOTE — Patient Instructions (Signed)
I will be in touch with your RSV results asap and your repeat CBC  Try using the omnicef antibiotc. Please let me know if you are not feeling better in the next few days- Sooner if worse.   We may consider doing a repeat chest x-ray if you are not improving!

## 2021-04-25 LAB — RESPIRATORY VIRUS PANEL

## 2021-04-26 ENCOUNTER — Encounter: Payer: Self-pay | Admitting: Family Medicine

## 2021-04-26 DIAGNOSIS — R052 Subacute cough: Secondary | ICD-10-CM

## 2021-04-29 NOTE — Addendum Note (Signed)
Addended by: Lamar Blinks C on: 04/29/2021 07:09 AM ? ? Modules accepted: Orders ? ?

## 2021-05-03 ENCOUNTER — Encounter (HOSPITAL_BASED_OUTPATIENT_CLINIC_OR_DEPARTMENT_OTHER): Payer: Self-pay

## 2021-05-03 ENCOUNTER — Ambulatory Visit (HOSPITAL_BASED_OUTPATIENT_CLINIC_OR_DEPARTMENT_OTHER)
Admission: RE | Admit: 2021-05-03 | Discharge: 2021-05-03 | Disposition: A | Discharge status: Home / Self Care | Payer: BLUE CROSS/BLUE SHIELD | Source: Ambulatory Visit | Attending: Family Medicine | Admitting: Family Medicine

## 2021-05-03 ENCOUNTER — Other Ambulatory Visit: Payer: Self-pay

## 2021-05-03 ENCOUNTER — Ambulatory Visit (HOSPITAL_BASED_OUTPATIENT_CLINIC_OR_DEPARTMENT_OTHER): Payer: BLUE CROSS/BLUE SHIELD

## 2021-05-03 DIAGNOSIS — R052 Subacute cough: Secondary | ICD-10-CM

## 2021-05-03 DIAGNOSIS — J984 Other disorders of lung: Secondary | ICD-10-CM | POA: Diagnosis not present

## 2021-05-03 NOTE — Addendum Note (Signed)
Addended by: Lamar Blinks C on: 05/03/2021 04:59 PM ? ? Modules accepted: Orders ? ?

## 2021-05-04 NOTE — Addendum Note (Signed)
Addended by: Lamar Blinks C on: 05/04/2021 07:54 AM ? ? Modules accepted: Orders ? ?

## 2021-05-09 ENCOUNTER — Other Ambulatory Visit: Payer: Self-pay | Admitting: Family Medicine

## 2021-05-09 DIAGNOSIS — M48062 Spinal stenosis, lumbar region with neurogenic claudication: Secondary | ICD-10-CM

## 2021-05-15 ENCOUNTER — Ambulatory Visit: Payer: BLUE CROSS/BLUE SHIELD | Admitting: Family Medicine

## 2021-05-15 ENCOUNTER — Other Ambulatory Visit: Payer: Self-pay

## 2021-05-15 ENCOUNTER — Ambulatory Visit (INDEPENDENT_AMBULATORY_CARE_PROVIDER_SITE_OTHER): Payer: BLUE CROSS/BLUE SHIELD

## 2021-05-15 ENCOUNTER — Ambulatory Visit: Payer: Self-pay

## 2021-05-15 VITALS — BP 140/90 | HR 70 | Ht 66.0 in | Wt 174.0 lb

## 2021-05-15 DIAGNOSIS — M25572 Pain in left ankle and joints of left foot: Secondary | ICD-10-CM

## 2021-05-15 DIAGNOSIS — M79672 Pain in left foot: Secondary | ICD-10-CM | POA: Diagnosis not present

## 2021-05-15 DIAGNOSIS — M19079 Primary osteoarthritis, unspecified ankle and foot: Secondary | ICD-10-CM | POA: Diagnosis not present

## 2021-05-15 DIAGNOSIS — M79662 Pain in left lower leg: Secondary | ICD-10-CM | POA: Diagnosis not present

## 2021-05-15 NOTE — Assessment & Plan Note (Signed)
Patient is having more pain going up the tibia area at this time.  I think this could be more compensation from patient's previous injury that is now well taken care of at the moment.  We discussed with patient about icing regimen, home exercises, which activities to do which ones to avoid.  Discussed with patient otherwise I would like to consider the possibility of a nerve conduction test as well as an ABI if this continues to give her difficulties.  We will get a tib-fib x-ray but I do not see any significant bony abnormality and on ultrasound no significant soft tissue abnormality noted. ?

## 2021-05-15 NOTE — Progress Notes (Signed)
?Kimberly Kent D.O. ?[facility] ?[place] ?Phone: [phone] ?Subjective:   ?I, Kimberly Kent, am serving as a Education administrator for Dr. Hulan Saas. ?This visit occurred during the SARS-CoV-2 public health emergency.  Safety protocols were in place, including screening questions prior to the visit, additional usage of staff PPE, and extensive cleaning of exam room while observing appropriate contact time as indicated for disinfecting solutions.  ? ?I'm seeing this patient by the request  of:  Kent, Kimberly Filler, MD ? ?CC: Left shin and foot pain ? ?PPJ:[id number]  ?04/17/2021 ?Patient given injection and tolerated the procedure well, discussed icing regimen and home exercise, discussed which activities to do and which ones to avoid.  Increase activity slowly.  Follow-up again in 6 to 8 weeks ? ?Updated 05/15/2021 ?Kimberly Kent is a 66 y.o. female coming in with complaint of left foot pain ? ?Patient's most recent MRI on the left foot showed the patient did have mild tendinosis of the peroneus longus and peroneal brevis noted.  Patient was also found to have navicular medial cuneiform joint arthritic changes.  At most recent visit we did do a an injection of the navicular cuneiform joint.  Patient states injection didn't do anything. Feels like something in lower leg is pulled. Swelling in lower leg. Feels it most when dorsiflexion. Standing feels like she looses feeling in 4th and 5th toe. No other complaints. ? ?  ? ?Past Medical History:  ?Diagnosis Date  ? Anemia   ? Anxiety   ? Arthritis   ? Blood transfusion without reported diagnosis   ? Depression   ? Fibromyalgia 12/29/2013  ? Hyperplastic colon polyp   ? Migraines   ? Neuromuscular disorder ([place])   ? Patent foramen ovale   ? Use filter on all IV fluids due to PFO  ? Post-operative nausea and vomiting   ? Special circumstances   ? Use filter on all IV tubing due to PFO  ? Thyroid disease   ? Vitamin D deficiency   ? ?Past  Surgical History:  ?Procedure Laterality Date  ? ABDOMINAL HYSTERECTOMY    ? COLONOSCOPY  04/06/2009  ? ENDOVENOUS ABLATION SAPHENOUS VEIN W/ LASER Left 2010  ? Approx 2010 per patient, Has LLE EVLT at [facility] in [place], had a reaction to Tumescent??? given during the procedure, could not follow through with procedure on Right leg.  ? KNEE ARTHROSCOPY    ? right knee/arthroscopic  ? TONSILLECTOMY    ? UPPER GASTROINTESTINAL ENDOSCOPY  2015  ? ?Social History  ? ?Socioeconomic History  ? Marital status: Married  ?  Spouse name: Not on file  ? Number of children: Not on file  ? Years of education: Not on file  ? Highest education level: Not on file  ?Occupational History  ? Occupation: mortgage industry [employer]  ?Tobacco Use  ? Smoking status: Never  ? Smokeless tobacco: Never  ?Vaping Use  ? Vaping Use: Never used  ?Substance and Sexual Activity  ? Alcohol use: Yes  ?  Comment: occ  ? Drug use: No  ? Sexual activity: Not on file  ?Other Topics Concern  ? Not on file  ?Social History Narrative  ? Not on file  ? ?Social Determinants of Health  ? ?Financial Resource Strain: Not on file  ?Food Insecurity: Not on file  ?Transportation Needs: Not on file  ?Physical Activity: Not on file  ?Stress: Not on file  ?Social Connections: Not on  file  ? ?Allergies  ?Allergen Reactions  ? Bupropion Hcl   ?  REACTION: hives  ? Codeine   ?  REACTION: nausea and vomiting  ? ?Family History  ?Problem Relation Age of Onset  ? Cancer Mother   ? Cancer Father   ? Diabetes Father   ? Parkinson's disease Maternal Grandmother   ? Cancer Maternal Grandfather   ? Colon cancer Neg Hx   ? Colon polyps Neg Hx   ? Esophageal cancer Neg Hx   ? Rectal cancer Neg Hx   ? Stomach cancer Neg Hx   ? ? ?Current Outpatient Medications (Endocrine & Metabolic):  ?  levothyroxine (SYNTHROID) 75 MCG tablet, Take 1 tablet (75 mcg total) by mouth daily before breakfast. ?  predniSONE (DELTASONE) 20 MG tablet, Take 40 mg daily for 3 days ,then 20 mg  daily for 3 days ? ? ?Current Outpatient Medications (Respiratory):  ?  albuterol (VENTOLIN HFA) 108 (90 Base) MCG/ACT inhaler, Inhale 2 puffs into the lungs every 6 (six) hours as needed for wheezing or shortness of breath. ? ?Current Outpatient Medications (Analgesics):  ?  meloxicam (MOBIC) 15 MG tablet, TAKE 1 TABLET (15 MG TOTAL) BY MOUTH DAILY. ? ? ?Current Outpatient Medications (Other):  ?  cefdinir (OMNICEF) 300 MG capsule, Take 1 capsule (300 mg total) by mouth 2 (two) times daily. ?  Cholecalciferol (VITAMIN D3) 1.25 MG (50000 UT) CAPS, Take 1 weekly for 12 weeks ?  cholecalciferol (VITAMIN D3) 25 MCG (1000 UNIT) tablet, Take 2,000 Units by mouth daily. ?  cyclobenzaprine (FLEXERIL) 10 MG tablet, Take 0.5-1 tablets (5-10 mg total) by mouth 2 (two) times daily as needed for muscle spasms. ?  doxycycline (VIBRAMYCIN) 100 MG capsule, Take 1 capsule (100 mg total) by mouth 2 (two) times daily. ?  FLUoxetine (PROZAC) 10 MG capsule, TAKE 1 CAPSULE BY MOUTH EVERY DAY ?  gabapentin (NEURONTIN) 100 MG capsule, TAKE 2 CAPSULES BY MOUTH AT BEDTIME. ?  valACYclovir (VALTREX) 1000 MG tablet, Take 2 gm once, repeat 12 hours later. Use as needed for cold sore ? ? ?Reviewed prior external information including notes and imaging from  ?primary care provider ?As well as notes that were available from care everywhere and other healthcare systems. ? ?Past medical history, social, surgical and family history all reviewed in electronic medical record.  No pertanent information unless stated regarding to the chief complaint.  ? ?Review of Systems: ? No headache, visual changes, nausea, vomiting, diarrhea, constipation, dizziness, abdominal pain, skin rash, fevers, chills, night sweats, weight loss, swollen lymph nodes, body aches, joint swelling, chest pain, shortness of breath, mood changes. POSITIVE muscle aches ? ?Objective  ?Blood pressure 140/90, pulse 70, height '5\' 6"'$  (1.676 m), weight 174 lb (78.9 kg), SpO2 98 %. ?   ?General: No apparent distress alert and oriented x3 mood and affect normal, dressed appropriately.  ?HEENT: Pupils equal, extraocular movements intact  ?Respiratory: Patient's speak in full sentences and does not appear short of breath  ?Cardiovascular: No lower extremity edema, non tender, no erythema  ?Gait normal with good balance and coordination.  ?MSK: Left ankle exam shows the patient does have improvement in range of motion.  Patient does have swelling just above the patient's sock line of the soft tissues.  Patient is nontender on exam but states that she feels a discomfort noted on the anterior aspect of the tibia.  Negative squeeze test noted. ? ?Limited muscular skeletal ultrasound was performed and interpreted by Hulan Saas,  M Limited ultrasound of patient's area of the tibia and does not show any significant bony abnormality.  No significant soft tissue swelling, other than some mild varicose veins nothing specific.  All of them seems to be fully compressible.  Patient's previous hypoechoic changes of the navicular cuneiform joint are completely resolved.  Still underlying arthritic changes noted. ?Impression: Arthritic changes of the foot but no significant hypoechoic changes.  No abnormality noted of the tibia or fibula on ultrasound today. ? ?  ?Impression and Recommendations:  ?  ? ?The above documentation has been reviewed and is accurate and complete Lyndal Pulley, DO ? ? ? ?

## 2021-05-15 NOTE — Patient Instructions (Signed)
Try to picture keeping big toe pointed down ?Xrays today ?Give it a month ?If worsening symptoms consider ABI and nerve conduction study ?See you again in 6 weeks ?

## 2021-05-15 NOTE — Assessment & Plan Note (Signed)
Significant decrease in the hypoechoic changes that was noted previously. ?

## 2021-05-16 ENCOUNTER — Ambulatory Visit: Payer: BLUE CROSS/BLUE SHIELD | Admitting: Family Medicine

## 2021-05-16 DIAGNOSIS — Z1389 Encounter for screening for other disorder: Secondary | ICD-10-CM | POA: Diagnosis not present

## 2021-05-16 DIAGNOSIS — Z780 Asymptomatic menopausal state: Secondary | ICD-10-CM | POA: Diagnosis not present

## 2021-05-16 DIAGNOSIS — Z130 Encounter for screening for diseases of the blood and blood-forming organs and certain disorders involving the immune mechanism: Secondary | ICD-10-CM | POA: Diagnosis not present

## 2021-05-16 DIAGNOSIS — Z01419 Encounter for gynecological examination (general) (routine) without abnormal findings: Secondary | ICD-10-CM | POA: Diagnosis not present

## 2021-05-21 ENCOUNTER — Other Ambulatory Visit: Payer: Self-pay

## 2021-05-21 DIAGNOSIS — J9801 Acute bronchospasm: Secondary | ICD-10-CM

## 2021-05-21 DIAGNOSIS — R052 Subacute cough: Secondary | ICD-10-CM

## 2021-05-23 NOTE — Telephone Encounter (Signed)
Are you able to follow up on this? ?

## 2021-05-29 ENCOUNTER — Ambulatory Visit: Payer: BLUE CROSS/BLUE SHIELD | Admitting: Family Medicine

## 2021-05-30 ENCOUNTER — Encounter: Payer: Self-pay | Admitting: Pulmonary Disease

## 2021-05-30 ENCOUNTER — Ambulatory Visit: Payer: BLUE CROSS/BLUE SHIELD | Admitting: Pulmonary Disease

## 2021-05-30 VITALS — BP 138/60 | HR 63 | Temp 98.0°F | Ht 66.0 in | Wt 177.2 lb

## 2021-05-30 DIAGNOSIS — J45998 Other asthma: Secondary | ICD-10-CM

## 2021-05-30 DIAGNOSIS — R052 Subacute cough: Secondary | ICD-10-CM | POA: Diagnosis not present

## 2021-05-30 DIAGNOSIS — R0981 Nasal congestion: Secondary | ICD-10-CM | POA: Diagnosis not present

## 2021-05-30 MED ORDER — IPRATROPIUM BROMIDE 0.06 % NA SOLN: 2.0000 | Freq: Four times a day (QID) | NASAL | 1 refills | Status: DC

## 2021-05-30 MED ORDER — FLUTICASONE FUROATE-VILANTEROL 100-25 MCG/ACT IN AEPB: 1.0000 | INHALATION_SPRAY | Freq: Every day | RESPIRATORY_TRACT | 0 refills | Status: DC

## 2021-05-30 NOTE — Progress Notes (Signed)
? ? ?Subjective:  ? ?PATIENT ID: Kimberly Kent GENDER: female DOB: 10-22-1955, MRN: [id number] ? ? ?HPI ? ?Chief Complaint  ?Patient presents with  ? Consult  ?  CT was on 05/03/21 coughing up green stuff since Jan. Has been on 2 antibiotics and inhaler  ? ? ?Reason for Visit: New consult for cough ? ?[name] is a 66 year old female with migraine, hypothyroidism, polyarthralgia, fibromyalgia, depression history of PFO who presents for evaluation of cough. ? ?She was referred by her PCP, Dr. Lorelei Kent.  Reviewed telephone notes in EMR.  Reviewed PCP note on 03/28/2021.  Patient has had productive cough for >8 weeks.  Associated with wheezing.  COVID test x2 negative.  Prescribe doxycycline without change.  Partial response with steroids.  RSV panel negative. CT ordered. ? ?She reports chronic cough since end of January. She continues to have thick green sputum daily. Reports wheezing. Does now awaken her at night. She does have nasal congestion. Takes allegra. Denies chest tightness or heaviness. She has albuterol and reports this helps with sputum production.  ? 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 ?I have personally reviewed patient's past medical/family/social history, allergies, current medications. ? ?Past Medical History:  ?Diagnosis Date  ? Anemia   ? Anxiety   ? Arthritis   ? Blood transfusion without reported diagnosis   ? Depression   ? Fibromyalgia 12/29/2013  ? Hyperplastic colon polyp   ? Migraines   ? Neuromuscular disorder ([place])   ? Patent foramen ovale   ? Use filter on all IV fluids due to PFO  ? Post-operative nausea and vomiting   ? Special circumstances   ? Use filter on all IV tubing due to PFO  ? Thyroid disease   ? Vitamin D deficiency   ?  ? ?Family History  ?Problem Relation Age of Onset  ? Cancer Mother   ? Cancer Father   ? Diabetes Father   ? Parkinson's disease Maternal Grandmother   ? Cancer Maternal Grandfather   ? Colon cancer Neg Hx   ? Colon polyps Neg Hx   ?  Esophageal cancer Neg Hx   ? Rectal cancer Neg Hx   ? Stomach cancer Neg Hx   ?  ? ?Social History  ? ?Occupational History  ? Occupation: mortgage industry [employer]  ?Tobacco Use  ? Smoking status: Never  ? Smokeless tobacco: Never  ?Vaping Use  ? Vaping Use: Never used  ?Substance and Sexual Activity  ? Alcohol use: Yes  ?  Comment: occ  ? Drug use: No  ? Sexual activity: Not on file  ? ? ?Allergies  ?Allergen Reactions  ? Bupropion Hcl   ?  REACTION: hives  ? Codeine   ?  REACTION: nausea and vomiting  ?  ? ?Outpatient Medications Prior to Visit  ?Medication Sig Dispense Refill  ? albuterol (VENTOLIN HFA) 108 (90 Base) MCG/ACT inhaler Inhale 2 puffs into the lungs every 6 (six) hours as needed for wheezing or shortness of breath. 18 g 2  ? FLUoxetine (PROZAC) 10 MG capsule TAKE 1 CAPSULE BY MOUTH EVERY DAY 90 capsule 1  ? gabapentin (NEURONTIN) 100 MG capsule TAKE 2 CAPSULES BY MOUTH AT BEDTIME. 180 capsule 2  ? levothyroxine (SYNTHROID) 75 MCG tablet Take 1 tablet (75 mcg total) by mouth daily before breakfast. 90 tablet 1  ? meloxicam (MOBIC) 15 MG tablet TAKE 1 TABLET (15 MG TOTAL) BY MOUTH DAILY. 90 tablet 1  ? cefdinir (OMNICEF)  300 MG capsule Take 1 capsule (300 mg total) by mouth 2 (two) times daily. 20 capsule 0  ? Cholecalciferol (VITAMIN D3) 1.25 MG (50000 UT) CAPS Take 1 weekly for 12 weeks (Patient not taking: Reported on 05/30/2021) 12 capsule 0  ? cholecalciferol (VITAMIN D3) 25 MCG (1000 UNIT) tablet Take 2,000 Units by mouth daily.    ? cyclobenzaprine (FLEXERIL) 10 MG tablet Take 0.5-1 tablets (5-10 mg total) by mouth 2 (two) times daily as needed for muscle spasms. (Patient not taking: Reported on 05/30/2021) 30 tablet 0  ? doxycycline (VIBRAMYCIN) 100 MG capsule Take 1 capsule (100 mg total) by mouth 2 (two) times daily. 20 capsule 0  ? predniSONE (DELTASONE) 20 MG tablet Take 40 mg daily for 3 days ,then 20 mg daily for 3 days 9 tablet 0  ? valACYclovir (VALTREX) 1000 MG tablet Take 2 gm once,  repeat 12 hours later. Use as needed for cold sore (Patient not taking: Reported on 05/30/2021) 20 tablet 1  ? ?No facility-administered medications prior to visit.  ? ? ?Review of Systems  ?Constitutional:  Negative for chills, diaphoresis, fever, malaise/fatigue and weight loss.  ?HENT:  Negative for congestion.   ?Respiratory:  Positive for cough. Negative for hemoptysis, sputum production, shortness of breath and wheezing.   ?Cardiovascular:  Negative for chest pain, palpitations and leg swelling.  ? ? ?Objective:  ? ?Vitals:  ? 05/30/21 0923  ?BP: 138/60  ?Pulse: 63  ?Temp: 98 ?F (36.7 ?C)  ?TempSrc: Oral  ?SpO2: 97%  ?Weight: 177 lb 3.2 oz (80.4 kg)  ?Height: '5\' 6"'$  (1.676 m)  ? ?SpO2: 97 % ?O2 Device: None (Room air) ? ?Physical Exam: ?General: Well-appearing, no acute distress ?HENT: [place], AT ?Eyes: EOMI, no scleral icterus ?Respiratory: Clear to auscultation bilaterally.  No crackles, wheezing or rales ?Cardiovascular: RRR, -M/R/G, no JVD ?Extremities:-Edema,-tenderness ?Neuro: AAO x4, CNII-XII grossly intact ?Psych: Normal mood, normal affect ? ?Data Reviewed: ? ?Imaging: ?CT chest 05/03/2021-overall unremarkable with normal parenchyma except for linear scarring in the lingula.  No acute abnormalities.   ? ?PFT: ?None on file ? ?Labs: ?Absolute eosinophil 04/23/2021-300 ? ?Health Maintenance: ?Immunization History  ?Administered Date(s) Administered  ? Influenza,inj,Quad PF,6+ Mos 10/20/2018, 03/30/2020, 11/27/2020  ? PFIZER(Purple Top)SARS-COV-2 Vaccination 01/23/2020  ? Tdap 05/13/2016  ? ?CT Lung Screen - not qualified. Never smoker ?   ?Assessment & Plan:  ? ?Discussion: ?66 year old female with migraine, hypothyroidism, polyarthralgia, fibromyalgia, depression history of PFO who presents for evaluation of cough. ? ?Post viral bronchitis/asthma ?Subacute cough ?Nasal congestion ?--ARRANGE for pulmonary function tests in May ?--START Breo 100-25 mcg ONE puff ONCE a day ?--START atrovent nasal spray 1 spray per  nostril TWICE a day as needed ?--ORDER sputum culture ? ?Orders Placed This Encounter  ?Procedures  ? Respiratory or Resp and Sputum Culture  ?  Standing Status:   Future  ?  Number of Occurrences:   1  ?  Standing Expiration Date:   05/30/2022  ? Pulmonary function test  ?  Standing Status:   Future  ?  Standing Expiration Date:   05/31/2022  ?  Order Specific Question:   Where should this test be performed?  ?  Answer:   [facility]  ?  Order Specific Question:   Full PFT: includes the following: basic spirometry, spirometry pre & post bronchodilator, diffusion capacity (DLCO), lung volumes  ?  Answer:   Full PFT  ? ?Meds ordered this encounter  ?Medications  ? fluticasone furoate-vilanterol (BREO ELLIPTA)  100-25 MCG/ACT AEPB  ?  Sig: Inhale 1 puff into the lungs daily.  ?  Dispense:  60 each  ?  Refill:  0  ? ipratropium (ATROVENT) 0.06 % nasal spray  ?  Sig: Place 2 sprays into both nostrils 4 (four) times daily.  ?  Dispense:  15 mL  ?  Refill:  1  ? ?No follow-ups on file. After PFTs ? ?I have spent a total time of 45-minutes on the day of the appointment reviewing prior documentation, coordinating care and discussing medical diagnosis and plan with the patient/family. Imaging, labs and tests included in this note have been reviewed and interpreted independently by me. ? ?Kashara Blocher Rodman Pickle, MD ?[facility] ?05/30/2021 9:55 AM  ?Office Number [phone] ? ? ?

## 2021-05-30 NOTE — Patient Instructions (Signed)
Post viral bronchitis/asthma ?Subacute cough ?Nasal congestion ?--ARRANGE for pulmonary function tests in May ?--START Breo 100-25 mcg ONE puff ONCE a day ?--START atrovent nasal spray 1 spray per nostril TWICE a day as needed ?--ORDER sputum culture ? ?Follow-up with me in May with PFTs prior. OK to order on separate on days ?

## 2021-06-02 ENCOUNTER — Encounter: Payer: Self-pay | Admitting: Pulmonary Disease

## 2021-06-02 LAB — RESPIRATORY CULTURE OR RESPIRATORY AND SPUTUM CULTURE
MICRO NUMBER:: 13225854
RESULT:: NORMAL
SPECIMEN QUALITY:: ADEQUATE

## 2021-06-04 ENCOUNTER — Ambulatory Visit: Payer: BLUE CROSS/BLUE SHIELD | Admitting: Family Medicine

## 2021-06-04 MED ORDER — PREDNISONE 10 MG PO TABS: 40.0000 mg | ORAL_TABLET | Freq: Every day | ORAL | 0 refills | Status: AC

## 2021-06-04 NOTE — Telephone Encounter (Signed)
Sent in prednisone 40 mg x 5 days Please update patient ?

## 2021-06-04 NOTE — Telephone Encounter (Signed)
Mychart message sent by pt stating that she would like to try prednisone. Routing to Dr. Loanne Drilling. ?

## 2021-06-19 ENCOUNTER — Encounter: Payer: Self-pay | Admitting: Family Medicine

## 2021-06-28 ENCOUNTER — Ambulatory Visit (INDEPENDENT_AMBULATORY_CARE_PROVIDER_SITE_OTHER): Payer: BLUE CROSS/BLUE SHIELD | Admitting: Family Medicine

## 2021-06-28 VITALS — BP 122/80 | HR 71 | Temp 97.5°F | Resp 18 | Ht 66.5 in | Wt 177.0 lb

## 2021-06-28 DIAGNOSIS — R0981 Nasal congestion: Secondary | ICD-10-CM

## 2021-06-28 DIAGNOSIS — R052 Subacute cough: Secondary | ICD-10-CM

## 2021-06-28 MED ORDER — CEFDINIR 300 MG PO CAPS: 300.0000 mg | ORAL_CAPSULE | Freq: Two times a day (BID) | ORAL | 0 refills | Status: DC

## 2021-06-28 NOTE — Progress Notes (Signed)
Therapist, music at [facility] ?[address], [address] ?[place], [place] [address] ?336 [phone] ?Fax [phone] ? ?Date:  06/28/2021  ? ?Name:  Kimberly Kent   DOB:  1955/07/29   MRN:  [id number] ? ?PCP:  Darreld Mclean, MD  ? ? ?Chief Complaint: cough and congestion (X January. Pt says her Allergy appt is on 07/12/21. She has already been seen by Pulm. Pt asks if Levaquin will help- she says she knows someone who had similar sxs and this helped. ) ? ? ?History of Present Illness: ? ?Kimberly Kent is a 66 y.o. very pleasant female patient who presents with the following: ? ?Pt seen today with concern of cough and congestion- history of migraine, hypothyroidism, polyarthralgia, fibromyalgia, depression history of PFO ? ?Last seen by myself in February with cough ?She has been to see pulmonology- Dr Loanne Drilling- as well- visit about a month ago: ?She was referred by her PCP, Dr. Lorelei Pont.  Reviewed telephone notes in EMR.  Reviewed PCP note on 03/28/2021.  Patient has had productive cough for >8 weeks.  Associated with wheezing.  COVID test x2 negative.  Prescribe doxycycline without change.  Partial response with steroids.  RSV panel negative. CT ordered. ?She reports chronic cough since end of January. She continues to have thick green sputum daily. Reports wheezing. Does now awaken her at night. She does have nasal congestion. Takes allegra. Denies chest tightness or heaviness. She has albuterol and reports this helps with sputum production.//////////////////////////////////////////////// ?Post viral bronchitis/asthma ?Subacute cough ?Nasal congestion ?--ARRANGE for pulmonary function tests in May ?--START Breo 100-25 mcg ONE puff ONCE a day ?--START atrovent nasal spray 1 spray per nostril TWICE a day as needed ?--ORDER sputum culture ? ?Pt notes she continues to cough ?She is using her Breo inhaler.  Her pulmonary function test is coming up  ?She also continues to have sinus congestion-she feels like  drainage from her sinuses is triggering her cough ?She notes her cough is productive ?We got a CT of her chest on 3/9 which was noncontributory ? ?She is also seeing an allergist in about 2 weeks  ? ?Pt notes her tongue felt funny after taking prednisone -the prescription I gave her back in February.  She stopped taking it, only took the 1 dose.  DR Loanne Drilling called in another prescription for steroids about a month ago, [name] filled it but never took it ?She notes that previously she has used prednisone many times without difficulty ?Patient Active Problem List  ? Diagnosis Date Noted  ? 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 Subacute cough 05/30/2021  ? Arthritis of midfoot 04/17/2021  ? Left ankle pain 01/16/2021  ? Rib fracture 12/05/2020  ? Piriformis syndrome of right side 10/02/2020  ? Right hamstring muscle strain 06/15/2020  ? Greater trochanteric bursitis of right hip 04/30/2018  ? Polyarthralgia 04/03/2018  ? Degenerative arthritis of right knee 04/03/2018  ? Lumbar spinal stenosis 04/03/2018  ? PFO (patent foramen ovale) 01/13/2018  ? Vitamin D deficiency 03/06/2014  ? Migraine with aura 05/20/2011  ? Hypothyroid 05/20/2011  ? SHORTNESS OF BREATH 12/22/2009  ? TRANSIENT VISUAL LOSS 07/13/2008  ? ? ?Past Medical History:  ?Diagnosis Date  ? Anemia   ? Anxiety   ? Arthritis   ? Blood transfusion without reported diagnosis   ? Depression   ? Fibromyalgia 12/29/2013  ? Hyperplastic colon polyp   ? Migraines   ? Neuromuscular disorder ([place])   ? Patent  foramen ovale   ? Use filter on all IV fluids due to PFO  ? Post-operative nausea and vomiting   ? Special circumstances   ? Use filter on all IV tubing due to PFO  ? Thyroid disease   ? Vitamin D deficiency   ? ? ?Past Surgical History:  ?Procedure Laterality Date  ? ABDOMINAL HYSTERECTOMY    ? COLONOSCOPY  04/06/2009  ? ENDOVENOUS ABLATION SAPHENOUS VEIN W/ LASER Left 2010  ? Approx 2010 per patient, Has LLE EVLT at [facility] in [place], had a reaction to  Tumescent??? given during the procedure, could not follow through with procedure on Right leg.  ? KNEE ARTHROSCOPY    ? right knee/arthroscopic  ? TONSILLECTOMY    ? UPPER GASTROINTESTINAL ENDOSCOPY  2015  ? ? ?Social History  ? ?Tobacco Use  ? Smoking status: Never  ? Smokeless tobacco: Never  ?Vaping Use  ? Vaping Use: Never used  ?Substance Use Topics  ? Alcohol use: Yes  ?  Comment: occ  ? Drug use: No  ? ? ?Family History  ?Problem Relation Age of Onset  ? Cancer Mother   ? Cancer Father   ? Diabetes Father   ? Parkinson's disease Maternal Grandmother   ? Cancer Maternal Grandfather   ? Colon cancer Neg Hx   ? Colon polyps Neg Hx   ? Esophageal cancer Neg Hx   ? Rectal cancer Neg Hx   ? Stomach cancer Neg Hx   ? ? ?Allergies  ?Allergen Reactions  ? Bupropion Hcl   ?  REACTION: hives  ? Codeine   ?  REACTION: nausea and vomiting  ? ? ?Medication list has been reviewed and updated. ? ?Current Outpatient Medications on File Prior to Visit  ?Medication Sig Dispense Refill  ? albuterol (VENTOLIN HFA) 108 (90 Base) MCG/ACT inhaler Inhale 2 puffs into the lungs every 6 (six) hours as needed for wheezing or shortness of breath. 18 g 2  ? Cholecalciferol (VITAMIN D3) 1.25 MG (50000 UT) CAPS Take 1 weekly for 12 weeks 12 capsule 0  ? cyclobenzaprine (FLEXERIL) 10 MG tablet Take 0.5-1 tablets (5-10 mg total) by mouth 2 (two) times daily as needed for muscle spasms. 30 tablet 0  ? fexofenadine-pseudoephedrine (ALLEGRA-D 24) 180-240 MG 24 hr tablet Take 1 tablet by mouth daily.    ? FLUoxetine (PROZAC) 10 MG capsule TAKE 1 CAPSULE BY MOUTH EVERY DAY 90 capsule 1  ? fluticasone furoate-vilanterol (BREO ELLIPTA) 100-25 MCG/ACT AEPB Inhale 1 puff into the lungs daily. 60 each 0  ? gabapentin (NEURONTIN) 100 MG capsule TAKE 2 CAPSULES BY MOUTH AT BEDTIME. 180 capsule 2  ? ipratropium (ATROVENT) 0.06 % nasal spray Place 2 sprays into both nostrils 4 (four) times daily. 15 mL 1  ? levothyroxine (SYNTHROID) 75 MCG tablet Take 1  tablet (75 mcg total) by mouth daily before breakfast. 90 tablet 1  ? meloxicam (MOBIC) 15 MG tablet TAKE 1 TABLET (15 MG TOTAL) BY MOUTH DAILY. 90 tablet 1  ? valACYclovir (VALTREX) 1000 MG tablet Take 2 gm once, repeat 12 hours later. Use as needed for cold sore 20 tablet 1  ? ?No current facility-administered medications on file prior to visit.  ? ? ?Review of Systems: ? ?As per HPI- otherwise negative. ? ? ?Physical Examination: ?Vitals:  ? 06/28/21 1323  ?BP: 122/80  ?Pulse: 71  ?Resp: 18  ?Temp: (!) 97.5 ?F (36.4 ?C)  ?SpO2: 99%  ? ?Vitals:  ? 06/28/21 1323  ?  Weight: 177 lb (80.3 kg)  ?Height: 5' 6.5" (1.689 m)  ? ?Body mass index is 28.14 kg/m?. ?Ideal Body Weight: Weight in (lb) to have BMI = 25: 156.9 ? ?GEN: no acute distress.  Mild overweight, looks well, Coughing occasionally ?HEENT: Atraumatic, Normocephalic.  Bilateral TM wnl, oropharynx normal.  PEERL,EOMI.   ?Ears and Nose: No external deformity. ?CV: RRR, No M/G/R. No JVD. No thrill. No extra heart sounds. ?PULM: CTA B, no wheezes, crackles, rhonchi. No retractions. No resp. distress. No accessory muscle use. ?EXTR: No c/c/e ?PSYCH: Normally interactive. Conversant.  ? ? ?Assessment and Plan: ?Sinus congestion - Plan: cefdinir (OMNICEF) 300 MG capsule ? ?Subacute cough ? ?Patient seen today with persistent cough and sinus congestion.  We have used antibiotics and steroids, referral to pulmonology.  She has an allergy appointment pending.  Right now it seems her main concern is sinus pressure and congestion.  We will treat with Omnicef for 10 days.  We also discussed being a CT scan of her sinuses, I have asked her to let me know if symptoms or not improved by next week.  In that case we will order a CT of her sinuses ?I also encouraged her to follow-up with pulmonary function testing as planned ? ?Signed ?Lamar Blinks, MD ? ?

## 2021-06-28 NOTE — Telephone Encounter (Signed)
Called pt and sheduled a sooner appointment today at 1:20 PM.  ?

## 2021-06-28 NOTE — Patient Instructions (Signed)
Let's try again with an antibiotic- Omnicef- for 10 days ?However if you don't get better I will order a CT scan of your sinuses ? ?Please let me know how you do  ?

## 2021-06-29 NOTE — Progress Notes (Signed)
?Charlann Boxer D.O. ?[facility] ?[place] ?Phone: [phone] ?Subjective:   ?I, [name], am serving as a Education administrator for Dr. Hulan Saas. ?This visit occurred during the SARS-CoV-2 public health emergency.  Safety protocols were in place, including screening questions prior to the visit, additional usage of staff PPE, and extensive cleaning of exam room while observing appropriate contact time as indicated for disinfecting solutions.  ? ?I'm seeing this patient by the request  of:  Copland, Gay Filler, MD ? ?CC: Left foot and leg pain ? ?FAO:[id number]  ?05/15/2021 ?Significant decrease in the hypoechoic changes that was noted previously. ? ?Patient is having more pain going up the tibia area at this time.  I think this could be more compensation from patient's previous injury that is now well taken care of at the moment.  We discussed with patient about icing regimen, home exercises, which activities to do which ones to avoid.  Discussed with patient otherwise I would like to consider the possibility of a nerve conduction test as well as an ABI if this continues to give her difficulties.  We will get a tib-fib x-ray but I do not see any significant bony abnormality and on ultrasound no significant soft tissue abnormality noted. ? ?Updated 5/9/203 ?ALANAH SAKUMA is a 66 y.o. female coming in with complaint of left foot pain. Doesn't want to do nerve conduction study. Pain is sporadic. Shoots across the foot. Thinks its nerve related and coming from shin. No other complaints. ? ?Xray (-) ? ?  ? ?Past Medical History:  ?Diagnosis Date  ? Anemia   ? Anxiety   ? Arthritis   ? Blood transfusion without reported diagnosis   ? Depression   ? Fibromyalgia 12/29/2013  ? Hyperplastic colon polyp   ? Migraines   ? Neuromuscular disorder ([place])   ? Patent foramen ovale   ? Use filter on all IV fluids due to PFO  ? Post-operative nausea and vomiting   ? Special circumstances   ? Use filter  on all IV tubing due to PFO  ? Thyroid disease   ? Vitamin D deficiency   ? ?Past Surgical History:  ?Procedure Laterality Date  ? ABDOMINAL HYSTERECTOMY    ? COLONOSCOPY  04/06/2009  ? ENDOVENOUS ABLATION SAPHENOUS VEIN W/ LASER Left 2010  ? Approx 2010 per patient, Has LLE EVLT at [facility] in [place], had a reaction to Tumescent??? given during the procedure, could not follow through with procedure on Right leg.  ? KNEE ARTHROSCOPY    ? right knee/arthroscopic  ? TONSILLECTOMY    ? UPPER GASTROINTESTINAL ENDOSCOPY  2015  ? ?Social History  ? ?Socioeconomic History  ? Marital status: Married  ?  Spouse name: Not on file  ? Number of children: Not on file  ? Years of education: Not on file  ? Highest education level: Not on file  ?Occupational History  ? Occupation: mortgage industry [employer]  ?Tobacco Use  ? Smoking status: Never  ? Smokeless tobacco: Never  ?Vaping Use  ? Vaping Use: Never used  ?Substance and Sexual Activity  ? Alcohol use: Yes  ?  Comment: occ  ? Drug use: No  ? Sexual activity: Not on file  ?Other Topics Concern  ? Not on file  ?Social History Narrative  ? Not on file  ? ?Social Determinants of Health  ? ?Financial Resource Strain: Not on file  ?Food Insecurity: Not on file  ?Transportation Needs:  Not on file  ?Physical Activity: Not on file  ?Stress: Not on file  ?Social Connections: Not on file  ? ?Allergies  ?Allergen Reactions  ? Bupropion Hcl   ?  REACTION: hives  ? Codeine   ?  REACTION: nausea and vomiting  ? ?Family History  ?Problem Relation Age of Onset  ? Cancer Mother   ? Cancer Father   ? Diabetes Father   ? Parkinson's disease Maternal Grandmother   ? Cancer Maternal Grandfather   ? Colon cancer Neg Hx   ? Colon polyps Neg Hx   ? Esophageal cancer Neg Hx   ? Rectal cancer Neg Hx   ? Stomach cancer Neg Hx   ? ? ?Current Outpatient Medications (Endocrine & Metabolic):  ?  levothyroxine (SYNTHROID) 75 MCG tablet, Take 1 tablet (75 mcg total) by mouth daily before  breakfast. ? ? ?Current Outpatient Medications (Respiratory):  ?  albuterol (VENTOLIN HFA) 108 (90 Base) MCG/ACT inhaler, Inhale 2 puffs into the lungs every 6 (six) hours as needed for wheezing or shortness of breath. ?  fexofenadine-pseudoephedrine (ALLEGRA-D 24) 180-240 MG 24 hr tablet, Take 1 tablet by mouth daily. ?  fluticasone furoate-vilanterol (BREO ELLIPTA) 100-25 MCG/ACT AEPB, Inhale 1 puff into the lungs daily. ?  ipratropium (ATROVENT) 0.06 % nasal spray, Place 2 sprays into both nostrils 4 (four) times daily. ? ?Current Outpatient Medications (Analgesics):  ?  meloxicam (MOBIC) 15 MG tablet, TAKE 1 TABLET (15 MG TOTAL) BY MOUTH DAILY. ? ? ?Current Outpatient Medications (Other):  ?  cefdinir (OMNICEF) 300 MG capsule, Take 1 capsule (300 mg total) by mouth 2 (two) times daily. ?  Cholecalciferol (VITAMIN D3) 1.25 MG (50000 UT) CAPS, Take 1 weekly for 12 weeks ?  cyclobenzaprine (FLEXERIL) 10 MG tablet, Take 0.5-1 tablets (5-10 mg total) by mouth 2 (two) times daily as needed for muscle spasms. ?  FLUoxetine (PROZAC) 10 MG capsule, TAKE 1 CAPSULE BY MOUTH EVERY DAY ?  gabapentin (NEURONTIN) 100 MG capsule, TAKE 2 CAPSULES BY MOUTH AT BEDTIME. ?  valACYclovir (VALTREX) 1000 MG tablet, Take 2 gm once, repeat 12 hours later. Use as needed for cold sore ? ? ?Reviewed prior external information including notes and imaging from  ?primary care provider ?As well as notes that were available from care everywhere and other healthcare systems. ? ?Past medical history, social, surgical and family history all reviewed in electronic medical record.  No pertanent information unless stated regarding to the chief complaint.  ? ?Review of Systems: ? No headache, visual changes, nausea, vomiting, diarrhea, constipation, dizziness, abdominal pain, skin rash, fevers, chills, night sweats, weight loss, swollen lymph nodes, body aches, joint swelling, chest pain, shortness of breath, mood changes. POSITIVE muscle  aches ? ?Objective  ?Blood pressure (!) 130/92, pulse 78, height '5\' 6"'$  (1.676 m), weight 177 lb (80.3 kg), SpO2 98 %. ?  ?General: No apparent distress alert and oriented x3 mood and affect normal, dressed appropriately.  ?HEENT: Pupils equal, extraocular movements intact  ?Respiratory: Patient's speak in full sentences and does not appear short of breath  ?Cardiovascular: No lower extremity edema, non tender, no erythema  ?Gait normal with good balance and coordination.  ?MSK:  left foot loss of lordosis with positive squeeze test patient does have some tightness in the anterior tibialis tendon. ? ?Procedure: Real-time Ultrasound Guided Injection of left foot neuroma ?Device: GE Logiq Q7 ?Ultrasound guided injection is preferred based studies that show increased duration, increased effect, greater accuracy, decreased procedural pain,  increased response rate, and decreased cost with ultrasound guided versus blind injection.  ?Verbal informed consent obtained.  ?Time-out conducted.  ?Noted no overlying erythema, induration, or other signs of local infection.  ?Skin prepped in a sterile fashion.  ?Local anesthesia: Topical Ethyl chloride.  ?With sterile technique and under real time ultrasound guidance: With a 25-gauge half inch needle injecting 0.5 cc of 0.5% Marcaine and 0.5 cc of Kenalog 40 mg/mL ?Completed without difficulty  ?Pain immediately improved suggesting accurate placement of the medication.  ?Advised to call if fevers/chills, erythema, induration, drainage, or persistent bleeding.  ?Impression: Technically successful ultrasound guided injection. ? ?  ?Impression and Recommendations:  ?  ? ?The above documentation has been reviewed and is accurate and complete Lyndal Pulley, DO ? ? ? ?

## 2021-07-02 ENCOUNTER — Ambulatory Visit: Payer: BLUE CROSS/BLUE SHIELD | Admitting: Family Medicine

## 2021-07-03 ENCOUNTER — Ambulatory Visit: Payer: Self-pay

## 2021-07-03 ENCOUNTER — Ambulatory Visit: Payer: BLUE CROSS/BLUE SHIELD | Admitting: Family Medicine

## 2021-07-03 VITALS — BP 130/92 | HR 78 | Ht 66.0 in | Wt 177.0 lb

## 2021-07-03 DIAGNOSIS — M79672 Pain in left foot: Secondary | ICD-10-CM

## 2021-07-03 DIAGNOSIS — G5762 Lesion of plantar nerve, left lower limb: Secondary | ICD-10-CM

## 2021-07-03 NOTE — Assessment & Plan Note (Signed)
Patient given injection and tolerated the procedure well, discussed icing regimen and home exercises.  Discussed proper she has over-the-counter orthotics.  Increase activity slowly.  Follow-up again in 6 to 8 weeks.  Worsening pain we did discuss nerve conduction study would be necessary.  Patient wants to avoid that considerably ?

## 2021-07-03 NOTE — Patient Instructions (Signed)
Good to see you!  ?Avoid being barefoot ?Wear good shoes ?See you again in 6-8 weeks ?

## 2021-07-10 ENCOUNTER — Ambulatory Visit: Payer: BLUE CROSS/BLUE SHIELD | Admitting: Pulmonary Disease

## 2021-07-12 DIAGNOSIS — J411 Mucopurulent chronic bronchitis: Secondary | ICD-10-CM | POA: Diagnosis not present

## 2021-07-13 ENCOUNTER — Encounter: Payer: Self-pay | Admitting: Family Medicine

## 2021-07-13 DIAGNOSIS — J321 Chronic frontal sinusitis: Secondary | ICD-10-CM

## 2021-07-13 DIAGNOSIS — R430 Anosmia: Secondary | ICD-10-CM

## 2021-07-18 ENCOUNTER — Ambulatory Visit (HOSPITAL_BASED_OUTPATIENT_CLINIC_OR_DEPARTMENT_OTHER)
Admission: RE | Admit: 2021-07-18 | Discharge: 2021-07-18 | Disposition: A | Discharge status: Home / Self Care | Payer: BLUE CROSS/BLUE SHIELD | Source: Ambulatory Visit | Attending: Family Medicine | Admitting: Family Medicine

## 2021-07-18 ENCOUNTER — Encounter: Payer: Self-pay | Admitting: Family Medicine

## 2021-07-18 DIAGNOSIS — J329 Chronic sinusitis, unspecified: Secondary | ICD-10-CM | POA: Diagnosis not present

## 2021-07-18 DIAGNOSIS — R430 Anosmia: Secondary | ICD-10-CM

## 2021-07-18 DIAGNOSIS — J321 Chronic frontal sinusitis: Secondary | ICD-10-CM

## 2021-07-18 DIAGNOSIS — J3489 Other specified disorders of nose and nasal sinuses: Secondary | ICD-10-CM | POA: Diagnosis not present

## 2021-07-18 DIAGNOSIS — J342 Deviated nasal septum: Secondary | ICD-10-CM | POA: Diagnosis not present

## 2021-07-19 NOTE — Addendum Note (Signed)
Addended by: Lamar Blinks C on: 07/19/2021 02:29 PM   Modules accepted: Orders

## 2021-07-24 NOTE — Progress Notes (Unsigned)
[place] at [facility] [address], [place], [place] [address] [phone]  Date:  07/26/2021   Name:  Kimberly Kent   DOB:  10-11-1955   MRN:  [id number]  PCP:  Darreld Mclean, MD    Chief Complaint: No chief complaint on file.   History of Present Illness:  Kimberly Kent is a 66 y.o. very pleasant female patient who presents with the following:  Patient seen today for concern of possible additional blood work to needed I last saw her on 5/4-at that time she had concern of cough and sinus congestion bothersome for several months.  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   Subacute cough 05/30/2021   Arthritis of midfoot 04/17/2021   Left ankle pain 01/16/2021   Rib fracture 12/05/2020   Piriformis syndrome of right side 10/02/2020   Right hamstring muscle strain 06/15/2020   Greater trochanteric bursitis of right hip 04/30/2018   Polyarthralgia 04/03/2018   Degenerative arthritis of right knee 04/03/2018   Lumbar spinal stenosis 04/03/2018   PFO (patent foramen ovale) 01/13/2018   Vitamin D deficiency 03/06/2014   Migraine with aura 05/20/2011   Hypothyroid 05/20/2011   SHORTNESS OF BREATH 12/22/2009   TRANSIENT VISUAL LOSS 07/13/2008    Past Medical History:  Diagnosis Date   Anemia    Anxiety    Arthritis    Blood transfusion without reported diagnosis    Depression    Fibromyalgia 12/29/2013   Hyperplastic colon polyp    Migraines    Neuromuscular disorder ([place])    Patent foramen ovale    Use filter on all IV fluids due to PFO   Post-operative nausea and vomiting    Special circumstances    Use filter on all IV tubing due to  PFO   Thyroid disease    Vitamin D deficiency     Past Surgical History:  Procedure Laterality Date   ABDOMINAL HYSTERECTOMY     COLONOSCOPY  04/06/2009   ENDOVENOUS ABLATION SAPHENOUS VEIN W/ LASER Left 2010   Approx 2010 per patient, Has LLE EVLT at [facility] in [place], had a reaction to Tumescent??? given during the procedure, could not follow through with procedure on Right leg.   KNEE ARTHROSCOPY     right knee/arthroscopic   TONSILLECTOMY     UPPER GASTROINTESTINAL ENDOSCOPY  2015    Social History   Tobacco Use   Smoking status: Never   Smokeless tobacco: Never  Vaping Use   Vaping Use: Never used  Substance Use Topics   Alcohol use: Yes    Comment: occ   Drug use: No    Family History  Problem Relation Age of Onset   Cancer Mother    Cancer Father    Diabetes Father    Parkinson's disease Maternal Grandmother    Cancer Maternal Grandfather    Colon cancer Neg Hx    Colon polyps Neg Hx    Esophageal cancer Neg Hx    Rectal cancer Neg Hx    Stomach cancer Neg Hx     Allergies  Allergen Reactions   Bupropion Hcl     REACTION:  hives   Codeine     REACTION: nausea and vomiting    Medication list has been reviewed and updated.  Current Outpatient Medications on File Prior to Visit  Medication Sig Dispense Refill   albuterol (VENTOLIN HFA) 108 (90 Base) MCG/ACT inhaler Inhale 2 puffs into the lungs every 6 (six) hours as needed for wheezing or shortness of breath. 18 g 2   cefdinir (OMNICEF) 300 MG capsule Take 1 capsule (300 mg total) by mouth 2 (two) times daily. 20 capsule 0   Cholecalciferol (VITAMIN D3) 1.25 MG (50000 UT) CAPS Take 1 weekly for 12 weeks 12 capsule 0   cyclobenzaprine (FLEXERIL) 10 MG tablet Take 0.5-1 tablets (5-10 mg total) by mouth 2 (two) times daily as needed for muscle spasms. 30 tablet 0   fexofenadine-pseudoephedrine (ALLEGRA-D 24) 180-240 MG 24 hr tablet Take 1 tablet by mouth daily.     FLUoxetine (PROZAC) 10 MG  capsule TAKE 1 CAPSULE BY MOUTH EVERY DAY 90 capsule 1   fluticasone furoate-vilanterol (BREO ELLIPTA) 100-25 MCG/ACT AEPB Inhale 1 puff into the lungs daily. 60 each 0   gabapentin (NEURONTIN) 100 MG capsule TAKE 2 CAPSULES BY MOUTH AT BEDTIME. 180 capsule 2   ipratropium (ATROVENT) 0.06 % nasal spray Place 2 sprays into both nostrils 4 (four) times daily. 15 mL 1   levothyroxine (SYNTHROID) 75 MCG tablet Take 1 tablet (75 mcg total) by mouth daily before breakfast. 90 tablet 1   meloxicam (MOBIC) 15 MG tablet TAKE 1 TABLET (15 MG TOTAL) BY MOUTH DAILY. 90 tablet 1   valACYclovir (VALTREX) 1000 MG tablet Take 2 gm once, repeat 12 hours later. Use as needed for cold sore 20 tablet 1   No current facility-administered medications on file prior to visit.    Review of Systems:  As per HPI- otherwise negative.   Physical Examination: There were no vitals filed for this visit. There were no vitals filed for this visit. There is no height or weight on file to calculate BMI. Ideal Body Weight:    GEN: no acute distress. HEENT: Atraumatic, Normocephalic.  Ears and Nose: No external deformity. CV: RRR, No M/G/R. No JVD. No thrill. No extra heart sounds. PULM: CTA B, no wheezes, crackles, rhonchi. No retractions. No resp. distress. No accessory muscle use. ABD: S, NT, ND, +BS. No rebound. No HSM. EXTR: No c/c/e PSYCH: Normally interactive. Conversant.    Assessment and Plan: ***  Signed Lamar Blinks, MD

## 2021-07-26 ENCOUNTER — Ambulatory Visit (INDEPENDENT_AMBULATORY_CARE_PROVIDER_SITE_OTHER): Payer: BLUE CROSS/BLUE SHIELD | Admitting: Family Medicine

## 2021-07-26 VITALS — BP 110/60 | HR 72 | Temp 98.0°F | Resp 18 | Ht 66.5 in | Wt 177.2 lb

## 2021-07-26 DIAGNOSIS — R052 Subacute cough: Secondary | ICD-10-CM | POA: Diagnosis not present

## 2021-07-26 DIAGNOSIS — R079 Chest pain, unspecified: Secondary | ICD-10-CM | POA: Diagnosis not present

## 2021-07-26 NOTE — Patient Instructions (Signed)
It was good to see you today Try the prednisone! Please let me know how this works for you

## 2021-07-27 ENCOUNTER — Encounter: Payer: Self-pay | Admitting: Family Medicine

## 2021-07-27 LAB — COMPREHENSIVE METABOLIC PANEL
ALT: 15 U/L (ref 0–35)
AST: 20 U/L (ref 0–37)
Albumin: 4.2 g/dL (ref 3.5–5.2)
Alkaline Phosphatase: 61 U/L (ref 39–117)
BUN: 15 mg/dL (ref 6–23)
CO2: 31 mEq/L (ref 19–32)
Calcium: 9.5 mg/dL (ref 8.4–10.5)
Chloride: 102 mEq/L (ref 96–112)
Creatinine, Ser: 0.72 mg/dL (ref 0.40–1.20)
GFR: 88.95 mL/min (ref >60.00)
Glucose, Bld: 92 mg/dL (ref 70–99)
Potassium: 4.1 mEq/L (ref 3.5–5.1)
Sodium: 138 mEq/L (ref 135–145)
Total Bilirubin: 0.5 mg/dL (ref 0.2–1.2)
Total Protein: 6.9 g/dL (ref 6.0–8.3)

## 2021-07-27 LAB — CBC
HCT: 36.8 % (ref 36.0–46.0)
Hemoglobin: 12.5 g/dL (ref 12.0–15.0)
MCHC: 33.9 g/dL (ref 30.0–36.0)
MCV: 93.9 fl (ref 78.0–100.0)
Platelets: 247 10*3/uL (ref 150.0–400.0)
RBC: 3.92 Mil/uL (ref 3.87–5.11)
RDW: 12.5 % (ref 11.5–15.5)
WBC: 6.1 10*3/uL (ref 4.0–10.5)

## 2021-08-09 MED ORDER — PREDNISONE 20 MG PO TABS: ORAL_TABLET | ORAL | 0 refills | Status: DC

## 2021-08-13 ENCOUNTER — Other Ambulatory Visit: Payer: Self-pay | Admitting: Family Medicine

## 2021-08-13 DIAGNOSIS — L565 Disseminated superficial actinic porokeratosis (DSAP): Secondary | ICD-10-CM | POA: Diagnosis not present

## 2021-08-13 DIAGNOSIS — Z85828 Personal history of other malignant neoplasm of skin: Secondary | ICD-10-CM | POA: Diagnosis not present

## 2021-08-13 DIAGNOSIS — L820 Inflamed seborrheic keratosis: Secondary | ICD-10-CM | POA: Diagnosis not present

## 2021-08-13 DIAGNOSIS — D225 Melanocytic nevi of trunk: Secondary | ICD-10-CM | POA: Diagnosis not present

## 2021-08-13 DIAGNOSIS — D2271 Melanocytic nevi of right lower limb, including hip: Secondary | ICD-10-CM | POA: Diagnosis not present

## 2021-08-14 ENCOUNTER — Ambulatory Visit: Payer: BLUE CROSS/BLUE SHIELD | Admitting: Family Medicine

## 2021-09-14 ENCOUNTER — Other Ambulatory Visit: Payer: Self-pay | Admitting: Family Medicine

## 2021-09-14 DIAGNOSIS — F428 Other obsessive-compulsive disorder: Secondary | ICD-10-CM

## 2021-09-18 ENCOUNTER — Encounter: Payer: Self-pay | Admitting: Family Medicine

## 2021-09-18 DIAGNOSIS — F428 Other obsessive-compulsive disorder: Secondary | ICD-10-CM

## 2021-09-20 ENCOUNTER — Telehealth (INDEPENDENT_AMBULATORY_CARE_PROVIDER_SITE_OTHER): Payer: BLUE CROSS/BLUE SHIELD | Admitting: Family Medicine

## 2021-09-20 DIAGNOSIS — F411 Generalized anxiety disorder: Secondary | ICD-10-CM

## 2021-09-20 MED ORDER — CLONAZEPAM 0.5 MG PO TABS: 0.2500 mg | ORAL_TABLET | Freq: Two times a day (BID) | ORAL | 1 refills | Status: DC | PRN

## 2021-09-20 NOTE — Progress Notes (Signed)
[place] at [facility] [address], [place], [place] [address] [phone]  Date:  09/20/2021   Name:  Kimberly Kent   DOB:  1955-09-27   MRN:  [id number]  PCP:  Darreld Mclean, MD    Chief Complaint: Anxiety   History of Present Illness:  Kimberly Kent is a 66 y.o. very pleasant female patient who presents with the following:  Virtual visit today to discuss anxiety  Patient location is home, my location is office.  Patient identity from with 2 factors, she gives consent for virtual visit today.  The patient and myself are present on the call today Last seen by myself in June with concern of CP  She had also been struggling with a cough for several months, seen by pulmonology. We did a course of prednisone at last visit and it did seem to help her  Today she notes anxiety increasing over the last 1-2 weeks She is not sure if this is due to stress at work or her relationship She has not slept the last 4-5 nights She has been on 10 mg of prozac for about 20 years!  She tried adding another 10 mg but it may have triggered a headache?   She is not having depression- that she can tell at least.  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   Subacute cough 05/30/2021   Arthritis of midfoot 04/17/2021   Left ankle pain 01/16/2021   Rib fracture 12/05/2020   Piriformis syndrome of right side 10/02/2020   Right hamstring muscle strain 06/15/2020   Greater trochanteric bursitis of right hip 04/30/2018   Polyarthralgia 04/03/2018   Degenerative arthritis of right knee 04/03/2018   Lumbar spinal stenosis 04/03/2018   PFO (patent foramen  ovale) 01/13/2018   Vitamin D deficiency 03/06/2014   Migraine with aura 05/20/2011   Hypothyroid 05/20/2011   SHORTNESS OF BREATH 12/22/2009   TRANSIENT VISUAL LOSS 07/13/2008    Past Medical History:  Diagnosis Date   Anemia    Anxiety    Arthritis    Blood transfusion without reported diagnosis    Depression    Fibromyalgia 12/29/2013   Hyperplastic colon polyp    Migraines    Neuromuscular disorder ([place])    Patent foramen ovale    Use filter on all IV fluids due to PFO   Post-operative nausea and vomiting    Special circumstances    Use filter on all IV tubing due to PFO   Thyroid disease    Vitamin D deficiency     Past Surgical History:  Procedure Laterality Date   ABDOMINAL HYSTERECTOMY     COLONOSCOPY  04/06/2009   ENDOVENOUS ABLATION SAPHENOUS VEIN W/ LASER Left 2010   Approx 2010 per patient, Has LLE EVLT at [facility] in [place], had a reaction to Tumescent??? given during the procedure, could not follow through with procedure on Right leg.   KNEE ARTHROSCOPY     right knee/arthroscopic   TONSILLECTOMY     UPPER GASTROINTESTINAL ENDOSCOPY  2015    Social History   Tobacco Use  Smoking status: Never   Smokeless tobacco: Never  Vaping Use   Vaping Use: Never used  Substance Use Topics   Alcohol use: Yes    Comment: occ   Drug use: No    Family History  Problem Relation Age of Onset   Cancer Mother    Cancer Father    Diabetes Father    Parkinson's disease Maternal Grandmother    Cancer Maternal Grandfather    Colon cancer Neg Hx    Colon polyps Neg Hx    Esophageal cancer Neg Hx    Rectal cancer Neg Hx    Stomach cancer Neg Hx     Allergies  Allergen Reactions   Bupropion Hcl     REACTION: hives   Codeine     REACTION: nausea and vomiting    Medication list has been reviewed and updated.  Current Outpatient Medications on File Prior to Visit  Medication Sig Dispense Refill   albuterol (VENTOLIN HFA) 108 (90 Base) MCG/ACT  inhaler Inhale 2 puffs into the lungs every 6 (six) hours as needed for wheezing or shortness of breath. 18 g 2   Cholecalciferol (VITAMIN D3) 1.25 MG (50000 UT) CAPS Take 1 weekly for 12 weeks 12 capsule 0   cyclobenzaprine (FLEXERIL) 10 MG tablet Take 0.5-1 tablets (5-10 mg total) by mouth 2 (two) times daily as needed for muscle spasms. 30 tablet 0   fexofenadine-pseudoephedrine (ALLEGRA-D 24) 180-240 MG 24 hr tablet Take 1 tablet by mouth daily.     FLUoxetine (PROZAC) 10 MG capsule TAKE 1 CAPSULE BY MOUTH EVERY DAY 90 capsule 1   fluticasone furoate-vilanterol (BREO ELLIPTA) 100-25 MCG/ACT AEPB Inhale 1 puff into the lungs daily. 60 each 0   gabapentin (NEURONTIN) 100 MG capsule TAKE 2 CAPSULES BY MOUTH AT BEDTIME. 180 capsule 2   ipratropium (ATROVENT) 0.06 % nasal spray Place 2 sprays into both nostrils 4 (four) times daily. 15 mL 1   levothyroxine (SYNTHROID) 75 MCG tablet Take 1 tablet (75 mcg total) by mouth daily before breakfast. 90 tablet 1   meloxicam (MOBIC) 15 MG tablet TAKE 1 TABLET (15 MG TOTAL) BY MOUTH DAILY. 90 tablet 1   predniSONE (DELTASONE) 20 MG tablet Take 40 mg by mouth daily for 3 days, then 20 mg by mouth daily for 7 days 13 tablet 0   valACYclovir (VALTREX) 1000 MG tablet TAKE 2 TABLETS ONCE, REPEAT 12 HOURS LATER. USE AS NEEDED FOR COLD SORE 20 tablet 1   No current facility-administered medications on file prior to visit.    Review of Systems:  As per HPI- otherwise negative.   Physical Examination: There were no vitals filed for this visit. There were no vitals filed for this visit. There is no height or weight on file to calculate BMI. Ideal Body Weight:    Patient observed via video monitor.  She looks well, her normal self.  No distress is noted  Assessment and Plan: GAD (generalized anxiety disorder) - Plan: clonazePAM (KLONOPIN) 0.5 MG tablet  Virtual visit today to discuss anxiety.  [place] has been using 10 mg of fluoxetine for many years.   However, over the last week or 2 she has felt a lot more anxious.  She is not aware of anything in particular that may have triggered increased anxiety.  She is having difficulty sleeping and having symptoms of panic attack  We decided to have her try increasing fluoxetine to 20 mg for a week or 2, see how she does with  this.  If she continues to have headaches with increased dose can discontinue  I also gave her a prescription for clonazepam to use as needed short-term.  Recommend using at bedtime, can try during the day as long as not too sedating.  She will keep [place] closely posted about how she is feeling  Signed Lamar Blinks, MD

## 2021-10-09 MED ORDER — FLUOXETINE HCL 20 MG PO CAPS: 20.0000 mg | ORAL_CAPSULE | Freq: Every day | ORAL | 3 refills | Status: DC

## 2021-10-09 NOTE — Addendum Note (Signed)
Addended by: Lamar Blinks C on: 10/09/2021 12:08 PM   Modules accepted: Orders

## 2021-10-11 DIAGNOSIS — J343 Hypertrophy of nasal turbinates: Secondary | ICD-10-CM | POA: Diagnosis not present

## 2021-10-11 DIAGNOSIS — J342 Deviated nasal septum: Secondary | ICD-10-CM | POA: Diagnosis not present

## 2021-10-11 DIAGNOSIS — J310 Chronic rhinitis: Secondary | ICD-10-CM | POA: Diagnosis not present

## 2021-10-31 NOTE — Progress Notes (Signed)
Kimberly Kent [place] [address] [place] Phone: [phone] Subjective:   Kimberly Kent, am serving as a scribe for Dr. Hulan Kent.  I'm seeing this patient by the request  of:  Kent, Kimberly Filler, MD  CC: Right knee and left hip pain  [id number]  07/03/2021 Patient given injection and tolerated the procedure well, discussed icing regimen and home exercises.  Discussed proper she has over-the-counter orthotics.  Increase activity slowly.  Follow-up again in 6 to 8 weeks.  Worsening pain we did discuss nerve conduction study would be necessary.  Patient wants to avoid that considerably  Update 11/01/2021 Kimberly Kent is a 66 y.o. female coming in with complaint of L hip and right knee pain. Patient states hip pain wakes her at night. Pain in area a little below greater trochanter. Hip doesn't hurt with activity but sometimes afterwards. Right knee swelling and pain in the posterior.      Past Medical History:  Diagnosis Date   Anemia    Anxiety    Arthritis    Blood transfusion without reported diagnosis    Depression    Fibromyalgia 12/29/2013   Hyperplastic colon polyp    Migraines    Neuromuscular disorder ([place])    Patent foramen ovale    Use filter on all IV fluids due to PFO   Post-operative nausea and vomiting    Special circumstances    Use filter on all IV tubing due to PFO   Thyroid disease    Vitamin D deficiency    Past Surgical History:  Procedure Laterality Date   ABDOMINAL HYSTERECTOMY     COLONOSCOPY  04/06/2009   ENDOVENOUS ABLATION SAPHENOUS VEIN W/ LASER Left 2010   Approx 2010 per patient, Has LLE EVLT at [facility] in [place], had a reaction to Tumescent??? given during the procedure, could not follow through with procedure on Right leg.   KNEE ARTHROSCOPY     right knee/arthroscopic   TONSILLECTOMY     UPPER GASTROINTESTINAL ENDOSCOPY  2015   Social History   Socioeconomic History   Marital  status: Married    Spouse name: Not on file   Number of children: Not on file   Years of education: Not on file   Highest education level: Not on file  Occupational History   Occupation: mortgage industry Advertising copywriter  Tobacco Use   Smoking status: Never   Smokeless tobacco: Never  Vaping Use   Vaping Use: Never used  Substance and Sexual Activity   Alcohol use: Yes    Comment: occ   Drug use: No   Sexual activity: Not on file  Other Topics Concern   Not on file  Social History Narrative   Not on file   Social Determinants of Health   Financial Resource Strain: Not on file  Food Insecurity: Not on file  Transportation Needs: Not on file  Physical Activity: Not on file  Stress: Not on file  Social Connections: Not on file   Allergies  Allergen Reactions   Bupropion Hcl     REACTION: hives   Codeine     REACTION: nausea and vomiting   Family History  Problem Relation Age of Onset   Cancer Mother    Cancer Father    Diabetes Father    Parkinson's disease Maternal Grandmother    Cancer Maternal Grandfather    Colon cancer Neg Hx    Colon polyps Neg Hx  Esophageal cancer Neg Hx    Rectal cancer Neg Hx    Stomach cancer Neg Hx     Current Outpatient Medications (Endocrine & Metabolic):    levothyroxine (SYNTHROID) 75 MCG tablet, Take 1 tablet (75 mcg total) by mouth daily before breakfast.   predniSONE (DELTASONE) 20 MG tablet, Take 40 mg by mouth daily for 3 days, then 20 mg by mouth daily for 7 days   Current Outpatient Medications (Respiratory):    albuterol (VENTOLIN HFA) 108 (90 Base) MCG/ACT inhaler, Inhale 2 puffs into the lungs every 6 (six) hours as needed for wheezing or shortness of breath.   fexofenadine-pseudoephedrine (ALLEGRA-D 24) 180-240 MG 24 hr tablet, Take 1 tablet by mouth daily.   fluticasone furoate-vilanterol (BREO ELLIPTA) 100-25 MCG/ACT AEPB, Inhale 1 puff into the lungs daily.   ipratropium (ATROVENT) 0.06 % nasal spray, Place 2  sprays into both nostrils 4 (four) times daily.  Current Outpatient Medications (Analgesics):    meloxicam (MOBIC) 15 MG tablet, TAKE 1 TABLET (15 MG TOTAL) BY MOUTH DAILY.   Current Outpatient Medications (Other):    Cholecalciferol (VITAMIN D3) 1.25 MG (50000 UT) CAPS, Take 1 weekly for 12 weeks   clonazePAM (KLONOPIN) 0.5 MG tablet, Take 0.5 tablets (0.25 mg total) by mouth 2 (two) times daily as needed for anxiety. May take 0.5 mg (whole tablet) if needed   cyclobenzaprine (FLEXERIL) 10 MG tablet, Take 0.5-1 tablets (5-10 mg total) by mouth 2 (two) times daily as needed for muscle spasms.   FLUoxetine (PROZAC) 20 MG capsule, Take 1 capsule (20 mg total) by mouth daily.   gabapentin (NEURONTIN) 100 MG capsule, TAKE 2 CAPSULES BY MOUTH AT BEDTIME.   valACYclovir (VALTREX) 1000 MG tablet, TAKE 2 TABLETS ONCE, REPEAT 12 HOURS LATER. USE AS NEEDED FOR COLD SORE   Reviewed prior external information including notes and imaging from  primary care provider As well as notes that were available from care everywhere and other healthcare systems.  Past medical history, social, surgical and family history all reviewed in electronic medical record.  No pertanent information unless stated regarding to the chief complaint.   Review of Systems:  No headache, visual changes, nausea, vomiting, diarrhea, constipation, dizziness, abdominal pain, skin rash, fevers, chills, night sweats, weight loss, swollen lymph nodes, body aches, joint swelling, chest pain, shortness of breath, mood changes. POSITIVE muscle aches  Objective  Blood pressure 110/74, pulse 67, height '5\' 6"'$  (1.676 m), weight 180 lb (81.6 kg), SpO2 98 %.   General: No apparent distress alert and oriented x3 mood and affect normal, dressed appropriately.  HEENT: Pupils equal, extraocular movements intact  Respiratory: Patient's speak in full sentences and does not appear short of breath  Cardiovascular: No lower extremity edema, non tender,  no erythema  Antalgic gait noted.  Does have some instability noted of the right hip.  Patient with more pain of the right knee.  Trace movement.  Nearly bone-on-bone osteoarthritic changes on the medial aspect. Patient's left hip severely tender to palpation positive FABER   After informed written and verbal consent, patient was seated on exam table. Right knee was prepped with alcohol swab and utilizing anterolateral approach, patient's right knee space was injected with 4:1  marcaine 0.5%: Kenalog '40mg'$ /dL. Patient tolerated the procedure well without immediate complications.   Procedure: Real-time Ultrasound Guided Injection of left  greater trochanteric bursitis secondary to patient's body habitus Device: GE Logiq Q7  Ultrasound guided injection is preferred based studies that show increased duration, increased  effect, greater accuracy, decreased procedural pain, increased response rate, and decreased cost with ultrasound guided versus blind injection.  Verbal informed consent obtained.  Time-out conducted.  Noted no overlying erythema, induration, or other signs of local infection.  Skin prepped in a sterile fashion.  Local anesthesia: Topical Ethyl chloride.  With sterile technique and under real time ultrasound guidance:  Greater trochanteric area was visualized and patient's bursa was noted. A 22-gauge 3 inch needle was inserted and 2 cc of 0.5% Marcaine and 1 cc of Kenalog 40 mg/dL was injected. Pictures taken Completed without difficulty  Pain immediately resolved suggesting accurate placement of the medication.  Advised to call if fevers/chills, erythema, induration, drainage, or persistent bleeding.  Impression: Technically successful ultrasound guided injection.     Impression and Recommendations:    The above documentation has been reviewed and is accurate and complete Lyndal Pulley, DO

## 2021-11-01 ENCOUNTER — Other Ambulatory Visit: Payer: Self-pay | Admitting: Family Medicine

## 2021-11-01 ENCOUNTER — Ambulatory Visit: Payer: Self-pay

## 2021-11-01 ENCOUNTER — Encounter: Payer: Self-pay | Admitting: Family Medicine

## 2021-11-01 ENCOUNTER — Ambulatory Visit (INDEPENDENT_AMBULATORY_CARE_PROVIDER_SITE_OTHER): Payer: 59 | Admitting: Family Medicine

## 2021-11-01 VITALS — BP 110/74 | HR 67 | Ht 66.0 in | Wt 180.0 lb

## 2021-11-01 DIAGNOSIS — M1711 Unilateral primary osteoarthritis, right knee: Secondary | ICD-10-CM | POA: Diagnosis not present

## 2021-11-01 DIAGNOSIS — M48062 Spinal stenosis, lumbar region with neurogenic claudication: Secondary | ICD-10-CM

## 2021-11-01 DIAGNOSIS — M25552 Pain in left hip: Secondary | ICD-10-CM

## 2021-11-01 DIAGNOSIS — M7062 Trochanteric bursitis, left hip: Secondary | ICD-10-CM

## 2021-11-01 NOTE — Assessment & Plan Note (Signed)
Injection given today and tolerated the procedure well.  Discussed icing regimen and home exercises.  Which activities to do and which ones to avoid.  Increase activity slowly.  Discussed hip abductor strengthening.  Differential includes lumbar radiculopathy that we will monitor.  Follow-up again in 6 to 8 weeks

## 2021-11-01 NOTE — Patient Instructions (Addendum)
Injection in knee and hip today Good to see you! 3 ibuprofen 3x a day in 3 days burst Keep treadmill flat

## 2021-11-01 NOTE — Assessment & Plan Note (Signed)
Chronic problem worsening symptoms again.  Does have effusion noted on ultrasound.  Discussed with patient about icing regimen and home exercises.  Still wants to avoid any surgical intervention.  Follow-up with me again in 6 to 8 weeks.

## 2021-11-05 ENCOUNTER — Other Ambulatory Visit: Payer: Self-pay | Admitting: Family Medicine

## 2021-11-05 DIAGNOSIS — M48062 Spinal stenosis, lumbar region with neurogenic claudication: Secondary | ICD-10-CM

## 2021-11-07 ENCOUNTER — Encounter: Payer: Self-pay | Admitting: Family Medicine

## 2021-11-21 ENCOUNTER — Ambulatory Visit: Payer: BLUE CROSS/BLUE SHIELD | Admitting: Family Medicine

## 2021-12-10 ENCOUNTER — Other Ambulatory Visit: Payer: Self-pay | Admitting: Family Medicine

## 2021-12-26 NOTE — Progress Notes (Signed)
[place] Phone: [phone] Subjective:   [name], am serving as a scribe for Dr. Hulan Saas.  I'm seeing this patient by the request  of:  Cop[place], Gay Filler, MD  CC: Low back pain  [id number]  11/01/2021 Chronic problem worsening symptoms again.  Does have effusion noted on ultrasound.  Discussed with patient about icing regimen and home exercises.  Still wants to avoid any surgical intervention.  Follow-up with me again in 6 to 8 weeks.  Injection given today and tolerated the procedure well.  Discussed icing regimen and home exercises.  Which activities to do and which ones to avoid.  Increase activity slowly.  Discussed hip abductor strengthening.  Differential includes lumbar radiculopathy that we will monitor.  Follow-up again in 6 to 8 weeks  Update 12/31/2021 Kimberly Kent is a 66 y.o. female coming in with complaint of L hip and R knee pain. Patient states that both of her hips are bothering her really bad starts lateral hips and radiates to the groin. Some days the pain will bounce back and forth on her lower back, today the pain is really bad lower left back/SI joint. The left hip pain can radiate laterally to he calf then across the left shin to medial left ankle. The right hip she notices if she left the left leg she can feel the hip radiate to the right knee. Patient states she played a golf tournament yesterday that has really stirred everything up. She notices the pain is worse if she stands for a long period of time or when she is sitting and goes to stand it takes her awhile to get up. Once she is up and moving the pain does seem to settle down a bit.       Past Medical History:  Diagnosis Date   Anemia    Anxiety    Arthritis    Blood transfusion without reported diagnosis    Depression    Fibromyalgia 12/29/2013   Hyperplastic colon polyp    Migraines    Neuromuscular disorder  ([place])    Patent foramen ovale    Use filter on all IV fluids due to PFO   Post-operative nausea and vomiting    Special circumstances    Use filter on all IV tubing due to PFO   Thyroid disease    Vitamin D deficiency    Past Surgical History:  Procedure Laterality Date   ABDOMINAL HYSTERECTOMY     COLONOSCOPY  04/06/2009   ENDOVENOUS ABLATION SAPHENOUS VEIN W/ LASER Left 2010   Approx 2010 per patient, Has LLE EVLT at [facility] in [place], had a reaction to Tumescent??? given during the procedure, could not follow through with procedure on Right leg.   KNEE ARTHROSCOPY     right knee/arthroscopic   TONSILLECTOMY     UPPER GASTROINTESTINAL ENDOSCOPY  2015   Social History   Socioeconomic History   Marital status: Married    Spouse name: Not on file   Number of children: Not on file   Years of education: Not on file   Highest education level: Not on file  Occupational History   Occupation: mortgage industry Advertising copywriter  Tobacco Use   Smoking status: Never   Smokeless tobacco: Never  Vaping Use   Vaping Use: Never used  Substance and Sexual Activity   Alcohol use: Yes    Comment: occ   Drug use: No  Sexual activity: Not on file  Other Topics Concern   Not on file  Social History Narrative   Not on file   Social Determinants of Health   Financial Resource Strain: Not on file  Food Insecurity: Not on file  Transportation Needs: Not on file  Physical Activity: Not on file  Stress: Not on file  Social Connections: Not on file   Allergies  Allergen Reactions   Bupropion Hcl     REACTION: hives   Codeine     REACTION: nausea and vomiting   Family History  Problem Relation Age of Onset   Cancer Mother    Cancer Father    Diabetes Father    Parkinson's disease Maternal Grandmother    Cancer Maternal Grandfather    Colon cancer Neg Hx    Colon polyps Neg Hx    Esophageal cancer Neg Hx    Rectal cancer Neg Hx    Stomach cancer Neg Hx     Current  Outpatient Medications (Endocrine & Metabolic):    levothyroxine (SYNTHROID) 75 MCG tablet, TAKE 1 TABLET BY MOUTH DAILY BEFORE BREAKFAST.   predniSONE (DELTASONE) 20 MG tablet, Take 40 mg by mouth daily for 3 days, then 20 mg by mouth daily for 7 days   Current Outpatient Medications (Respiratory):    albuterol (VENTOLIN HFA) 108 (90 Base) MCG/ACT inhaler, Inhale 2 puffs into the lungs every 6 (six) hours as needed for wheezing or shortness of breath.   fexofenadine-pseudoephedrine (ALLEGRA-D 24) 180-240 MG 24 hr tablet, Take 1 tablet by mouth daily.   fluticasone furoate-vilanterol (BREO ELLIPTA) 100-25 MCG/ACT AEPB, Inhale 1 puff into the lungs daily.   ipratropium (ATROVENT) 0.06 % nasal spray, Place 2 sprays into both nostrils 4 (four) times daily.  Current Outpatient Medications (Analgesics):    celecoxib (CELEBREX) 200 MG capsule, Take 1 capsule (200 mg total) by mouth 2 (two) times daily.   meloxicam (MOBIC) 15 MG tablet, TAKE 1 TABLET (15 MG TOTAL) BY MOUTH DAILY.   Current Outpatient Medications (Other):    Cholecalciferol (VITAMIN D3) 1.25 MG (50000 UT) CAPS, Take 1 weekly for 12 weeks   clonazePAM (KLONOPIN) 0.5 MG tablet, Take 0.5 tablets (0.25 mg total) by mouth 2 (two) times daily as needed for anxiety. May take 0.5 mg (whole tablet) if needed   cyclobenzaprine (FLEXERIL) 10 MG tablet, Take 0.5-1 tablets (5-10 mg total) by mouth 2 (two) times daily as needed for muscle spasms.   FLUoxetine (PROZAC) 20 MG capsule, Take 1 capsule (20 mg total) by mouth daily.   gabapentin (NEURONTIN) 100 MG capsule, Take 2 capsules (200 mg total) by mouth at bedtime.   valACYclovir (VALTREX) 1000 MG tablet, TAKE 2 TABLETS ONCE, REPEAT 12 HOURS LATER. USE AS NEEDED FOR COLD SORE   Reviewed prior external information including notes and imaging from  primary care provider As well as notes that were available from care everywhere and other healthcare systems.  Past medical history, social,  surgical and family history all reviewed in electronic medical record.  No pertanent information unless stated regarding to the chief complaint.   Review of Systems:  No headache, visual changes, nausea, vomiting, diarrhea, constipation, dizziness, abdominal pain, skin rash, fevers, chills, night sweats, weight loss, swollen lymph nodes,  joint swelling, chest pain, shortness of breath, mood changes. POSITIVE muscle aches, body aches  Objective  Blood pressure 138/80, pulse 71, height '5\' 6"'$  (1.676 m), weight 180 lb (81.6 kg), SpO2 98 %.   General: No apparent  distress alert and oriented x3 mood and affect normal, dressed appropriately.  HEENT: Pupils equal, extraocular movements intact  Respiratory: Patient's speak in full sentences and does not appear short of breath  Cardiovascular: No lower extremity edema, non tender, no erythema  Low back exam does have some loss of lordosis.  Tightness noted in the paraspinal musculature bilaterally.  No significant tightness in the hamstrings bilaterally.  Does not lack the last 10 degrees of extension in the last 5 degrees of flexion.  Tightness with FABER test bilaterally with voluntary guarding. Right knee does show arthritic changes.  Instability is noted with valgus and varus force   Limited muscular skeletal ultrasound was performed and interpreted by Hulan Saas, M  Limited ultrasound of patient's right knee has less hypoechoic changes and effusion noted.  Still with significant arthritic changes of the tricompartment noted though. Impression: Improvement in the effusion with underlying severe arthritis   Impression and Recommendations:    The above documentation has been reviewed and is accurate and complete Lyndal Pulley, DO

## 2021-12-28 IMAGING — CT CT ABDOMEN W/ CM
2 of 4 series · 12 of 36 positions shown, 19 images · IV contrast (Omnipaque)
Comparison: 10/04/2010 and ultrasound May 26, 2019

CLINICAL DATA: Persistent right upper quadrant abdominal pain

EXAM:
CT ABDOMEN WITH CONTRAST
TECHNIQUE: Multidetector CT imaging of the abdomen was performed using the
standard protocol following bolus administration of intravenous
contrast.
CONTRAST:  100mL OMNIPAQUE IOHEXOL 300 MG/ML  SOLN

[Series 2: axial st · axial · 0.79mm/px · z∈[-250,-44]mm · 11 of 49 slices shown, 17 images]
[im 4/49  soft-tissue]
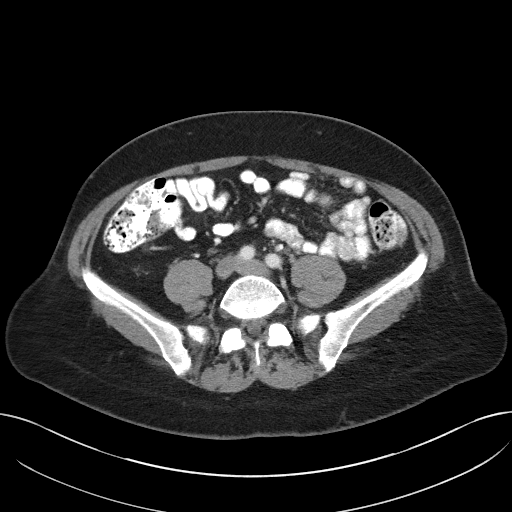
[im 4/49  bone]
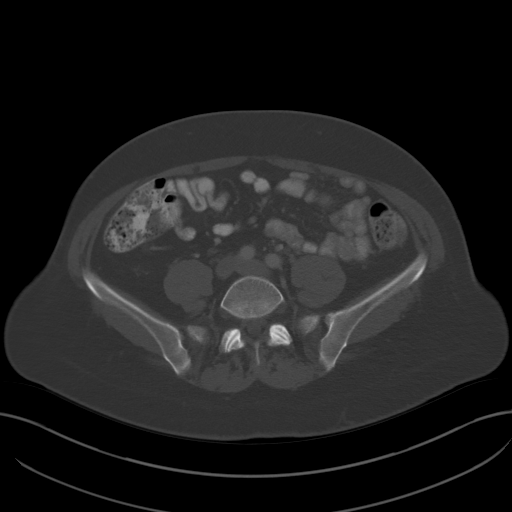
[im 8/49  soft-tissue]
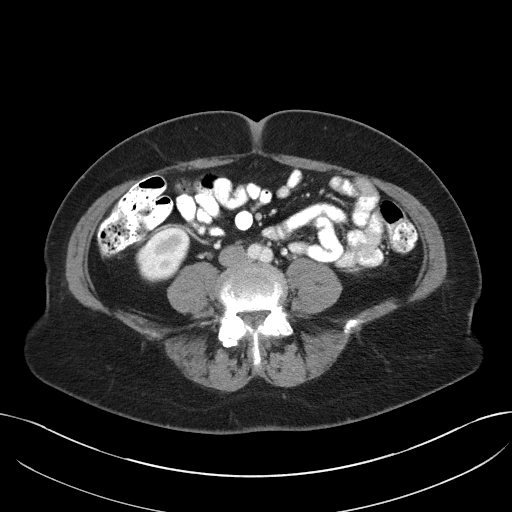
[im 12/49  soft-tissue]
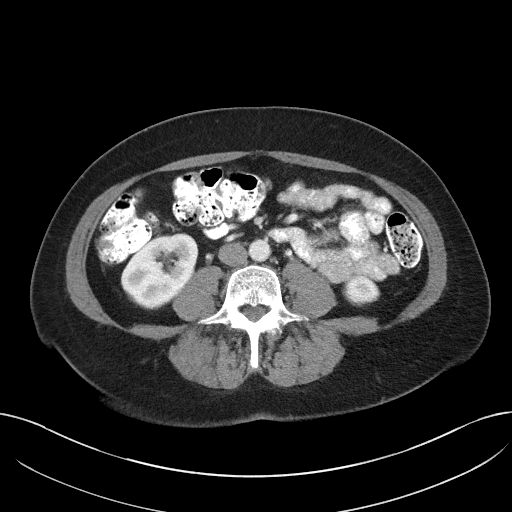
[im 15/49  soft-tissue]
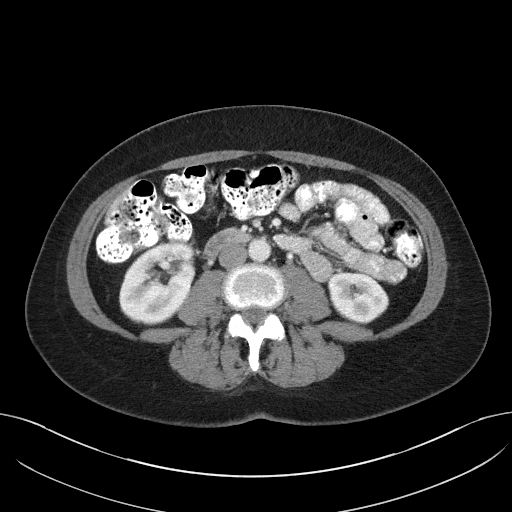
[im 19/49  soft-tissue]
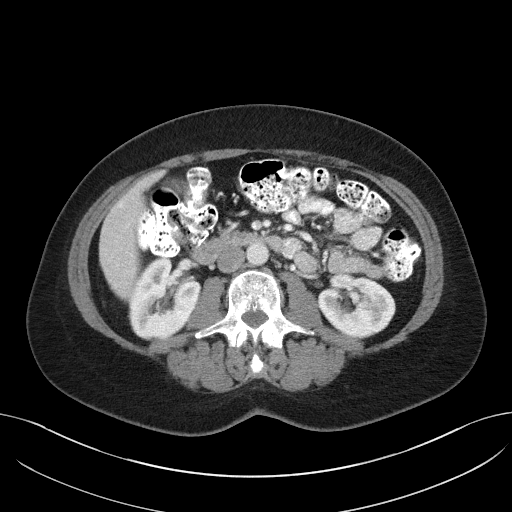
[im 26/49  soft-tissue]
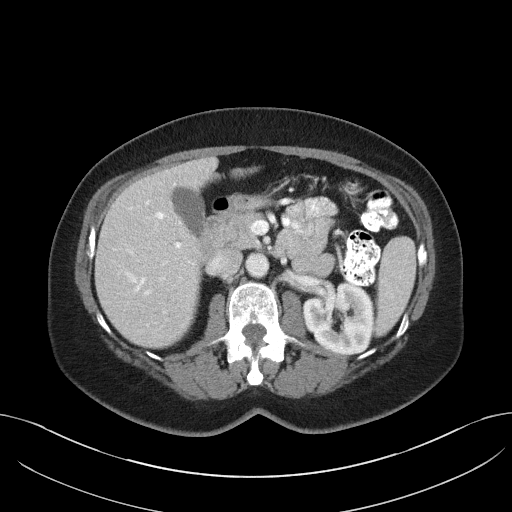
[im 30/49  soft-tissue]
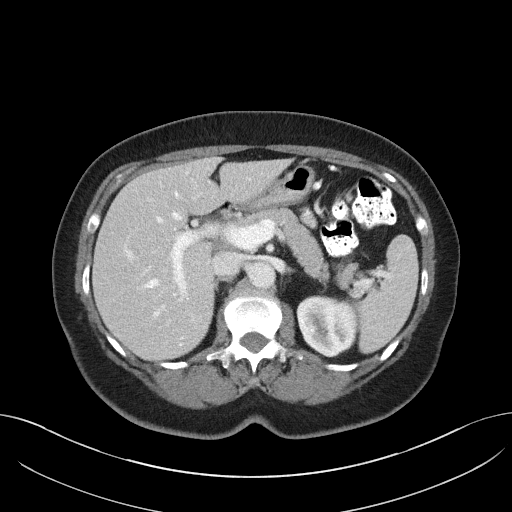
[im 34/49  soft-tissue]
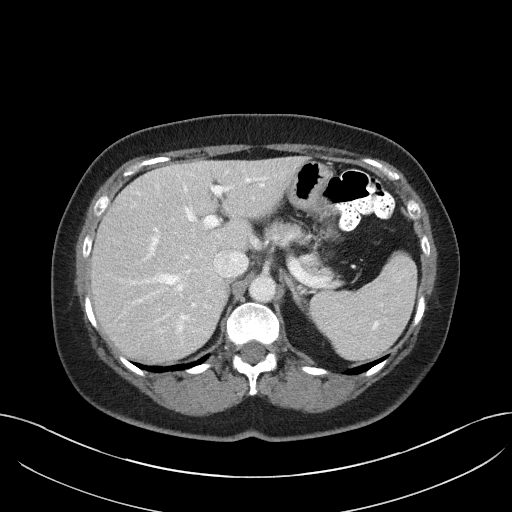
[im 34/49  lung]
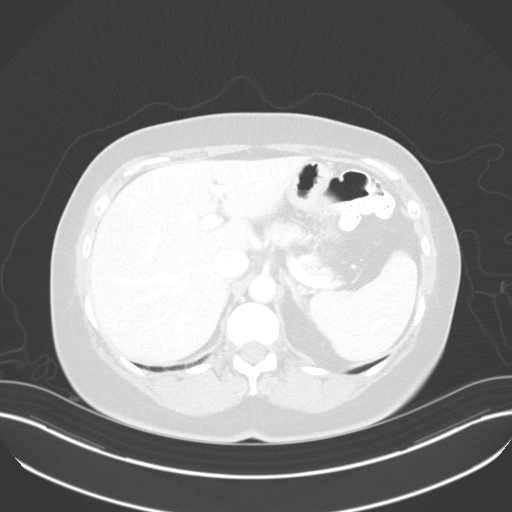
[im 37/49  soft-tissue]
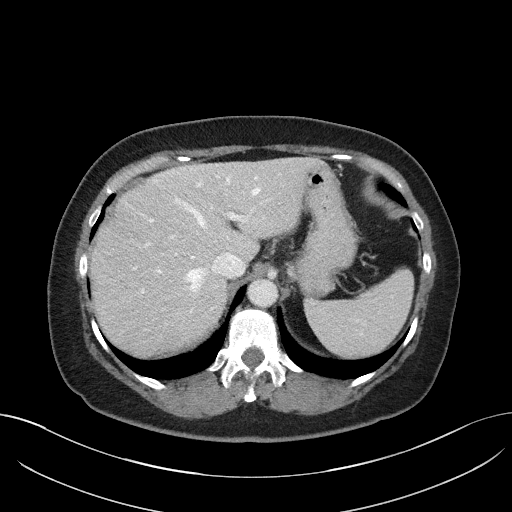
[im 37/49  lung]
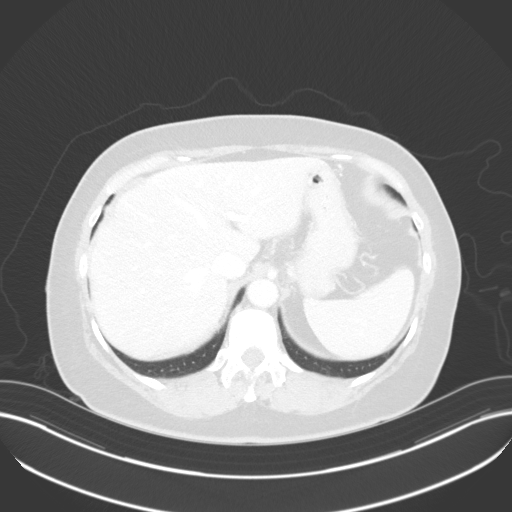
[im 37/49  bone]
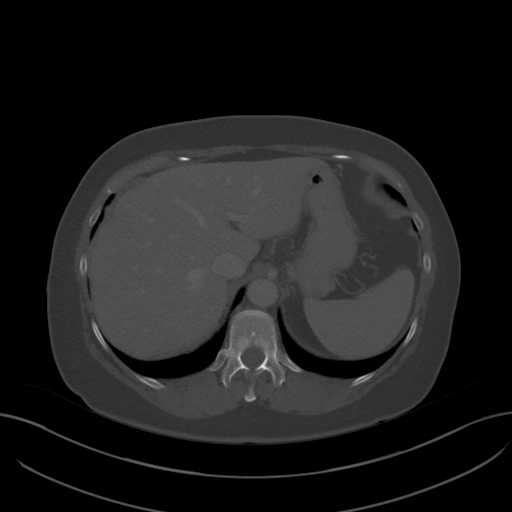
[im 41/49  soft-tissue]
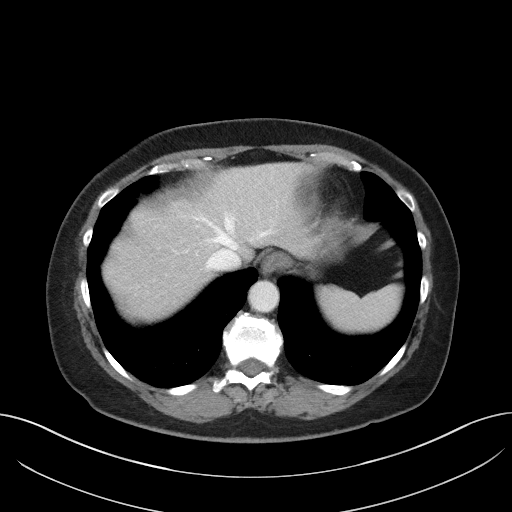
[im 41/49  lung]
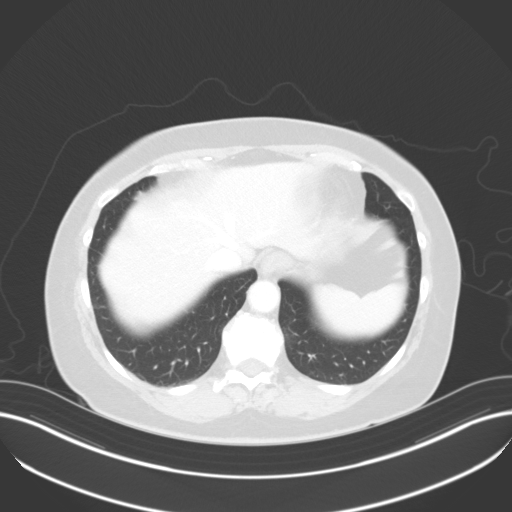
[im 45/49  soft-tissue]
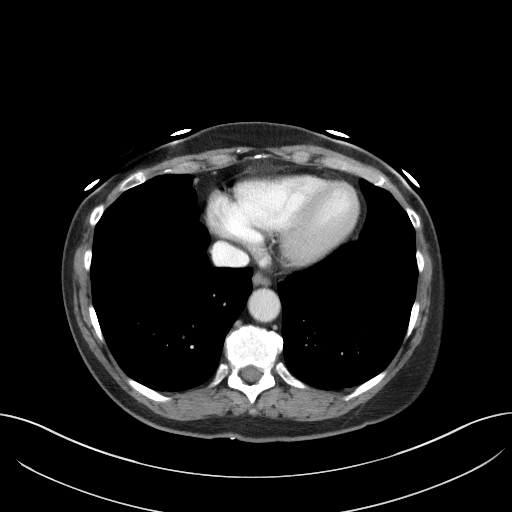
[im 45/49  lung]
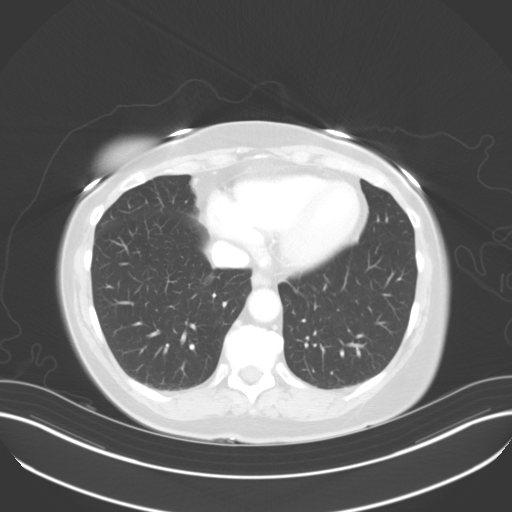

[Series 6: sagittal st · sagittal · 0.49mm/px · 1 of 119 slices shown, 2 images]
[im 40/119  soft-tissue]
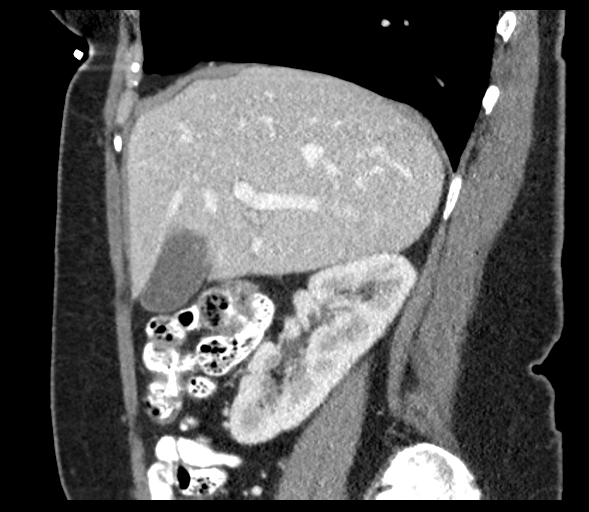
[im 40/119  bone]
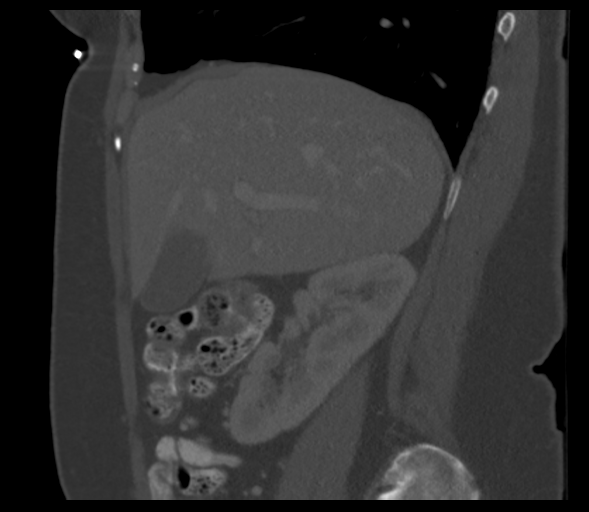

[12 of 36 positions shown; findings below may reference images not displayed]

FINDINGS: Lower chest: No consolidation. No pleural effusion.

Hepatobiliary: No focal, suspicious hepatic lesion. No
pericholecystic stranding. No biliary ductal dilation. Portal vein
is patent.

Pancreas: Pancreas without inflammation, ductal dilation or
inflammation.

Spleen: Spleen is normal size without focal lesion.

Adrenals/Urinary Tract: Adrenal glands are normal.

No hydronephrosis. No suspicious renal lesion. Tiny low-density in
the upper pole of the right kidney is likely a small cyst and is not
changed.

Stomach/Bowel: Imaging of the gastrointestinal tract is unremarkable
to the extent that is visualized. Pelvic bowel loops are not imaged.

Vascular/Lymphatic: Abdominal vascular structures are patent. No
retroperitoneal or upper abdominal lymphadenopathy.

Other: No abdominal wall hernia or abnormality. No abdominopelvic
ascites.

Musculoskeletal: No acute bone finding. No destructive bone process.
IMPRESSION: No acute abnormality in the abdomen.

## 2022-01-01 ENCOUNTER — Ambulatory Visit: Payer: Self-pay

## 2022-01-01 ENCOUNTER — Ambulatory Visit (INDEPENDENT_AMBULATORY_CARE_PROVIDER_SITE_OTHER): Payer: 59

## 2022-01-01 ENCOUNTER — Ambulatory Visit (INDEPENDENT_AMBULATORY_CARE_PROVIDER_SITE_OTHER): Payer: 59 | Admitting: Family Medicine

## 2022-01-01 VITALS — BP 138/80 | HR 71 | Ht 66.0 in | Wt 180.0 lb

## 2022-01-01 DIAGNOSIS — M1711 Unilateral primary osteoarthritis, right knee: Secondary | ICD-10-CM | POA: Diagnosis not present

## 2022-01-01 DIAGNOSIS — M25552 Pain in left hip: Secondary | ICD-10-CM | POA: Diagnosis not present

## 2022-01-01 DIAGNOSIS — M545 Low back pain, unspecified: Secondary | ICD-10-CM

## 2022-01-01 DIAGNOSIS — M48062 Spinal stenosis, lumbar region with neurogenic claudication: Secondary | ICD-10-CM | POA: Diagnosis not present

## 2022-01-01 MED ORDER — METHYLPREDNISOLONE ACETATE 40 MG/ML IJ SUSP
40.0000 mg | Freq: Once | INTRAMUSCULAR | Status: AC
  Administered 2022-01-01: 40 mg via INTRAMUSCULAR

## 2022-01-01 MED ORDER — KETOROLAC TROMETHAMINE 60 MG/2ML IM SOLN
60.0000 mg | Freq: Once | INTRAMUSCULAR | Status: AC
  Administered 2022-01-01: 60 mg via INTRAMUSCULAR

## 2022-01-01 MED ORDER — CELECOXIB 200 MG PO CAPS: 200.0000 mg | ORAL_CAPSULE | Freq: Two times a day (BID) | ORAL | 0 refills | Status: DC

## 2022-01-01 NOTE — Assessment & Plan Note (Signed)
Patient has significant decrease in the effusion noted but still has the arthritic changes.  Can consider treatment options.  Has done relatively well with the injection.

## 2022-01-01 NOTE — Patient Instructions (Addendum)
Good to see you Injection in the backside today X-ray on way out Stop the Meloxicam start Celebrex tomorrow Celebrex '200mg'$  twice a day Follow up in 4-5 weeks

## 2022-01-01 NOTE — Assessment & Plan Note (Signed)
Known lumbar spinal stenosis.  Worsening symptoms at this time.   Make sure there is no type of compression fracture but I think it would be highly unlikely.  Toradol and Depo-Medrol given today.  Discussed icing regimen.  Have done further work-up for anti-inflammatory and other pathology that could be contributing to this.  Follow-up with me again in 4 weeks change medications to Cymbalta 200 mg twice a day and discontinue the meloxicam.

## 2022-01-03 ENCOUNTER — Encounter: Payer: Self-pay | Admitting: Family Medicine

## 2022-01-15 ENCOUNTER — Encounter: Payer: Self-pay | Admitting: Family Medicine

## 2022-01-15 DIAGNOSIS — U071 COVID-19: Secondary | ICD-10-CM

## 2022-01-15 MED ORDER — NIRMATRELVIR/RITONAVIR (PAXLOVID)TABLET: 3.0000 | ORAL_TABLET | Freq: Two times a day (BID) | ORAL | 0 refills | Status: AC

## 2022-01-15 NOTE — Addendum Note (Signed)
Addended by: Lamar Blinks C on: 01/15/2022 06:07 PM   Modules accepted: Orders

## 2022-02-05 NOTE — Progress Notes (Unsigned)
[place] Phone: [phone] Subjective:   [name], am serving as a scribe for Dr. Hulan Saas.  I'm seeing this patient by the request  of:  Copland, Gay Filler, MD  CC: Right knee and back pain follow-up  WGN:[id number]  01/01/2022 Patient has significant decrease in the effusion noted but still has the arthritic changes.  Can consider treatment options.  Has done relatively well with the injection.      Known lumbar spinal stenosis.  Worsening symptoms at this time.   Make sure there is no type of compression fracture but I think it would be highly unlikely.  Toradol and Depo-Medrol given today.  Discussed icing regimen.  Have done further work-up for anti-inflammatory and other pathology that could be contributing to this.  Follow-up with me again in 4 weeks change medications to Cymbalta 200 mg twice a day and discontinue the meloxicam.     Update 02/07/2022 AMBRE KOBAYASHI is a 66 y.o. female coming in with complaint of lumbar spine pain. Patient states that she cannot take celebrex as it bothers her stomach. Went back to taking Meloxicam. When she does not take medication she will be very achy throughout the entire body. Pain in lumbar spine causing radiating symptoms into L thigh.   R knee has been doing well. Working out at [employer].        Past Medical History:  Diagnosis Date   Anemia    Anxiety    Arthritis    Blood transfusion without reported diagnosis    Depression    Fibromyalgia 12/29/2013   Hyperplastic colon polyp    Migraines    Neuromuscular disorder ([place])    Patent foramen ovale    Use filter on all IV fluids due to PFO   Post-operative nausea and vomiting    Special circumstances    Use filter on all IV tubing due to PFO   Thyroid disease    Vitamin D deficiency    Past Surgical History:  Procedure Laterality Date   ABDOMINAL HYSTERECTOMY     COLONOSCOPY   04/06/2009   ENDOVENOUS ABLATION SAPHENOUS VEIN W/ LASER Left 2010   Approx 2010 per patient, Has LLE EVLT at [facility] in [place], had a reaction to Tumescent??? given during the procedure, could not follow through with procedure on Right leg.   KNEE ARTHROSCOPY     right knee/arthroscopic   TONSILLECTOMY     UPPER GASTROINTESTINAL ENDOSCOPY  2015   Social History   Socioeconomic History   Marital status: Married    Spouse name: Not on file   Number of children: Not on file   Years of education: Not on file   Highest education level: Not on file  Occupational History   Occupation: mortgage industry Advertising copywriter  Tobacco Use   Smoking status: Never   Smokeless tobacco: Never  Vaping Use   Vaping Use: Never used  Substance and Sexual Activity   Alcohol use: Yes    Comment: occ   Drug use: No   Sexual activity: Not on file  Other Topics Concern   Not on file  Social History Narrative   Not on file   Social Determinants of Health   Financial Resource Strain: Not on file  Food Insecurity: Not on file  Transportation Needs: Not on file  Physical Activity: Not on file  Stress: Not on file  Social Connections: Not on file  Allergies  Allergen Reactions   Bupropion Hcl     REACTION: hives   Codeine     REACTION: nausea and vomiting   Family History  Problem Relation Age of Onset   Cancer Mother    Cancer Father    Diabetes Father    Parkinson's disease Maternal Grandmother    Cancer Maternal Grandfather    Colon cancer Neg Hx    Colon polyps Neg Hx    Esophageal cancer Neg Hx    Rectal cancer Neg Hx    Stomach cancer Neg Hx     Current Outpatient Medications (Endocrine & Metabolic):    levothyroxine (SYNTHROID) 75 MCG tablet, TAKE 1 TABLET BY MOUTH DAILY BEFORE BREAKFAST.    Current Outpatient Medications (Analgesics):    celecoxib (CELEBREX) 200 MG capsule, Take 1 capsule (200 mg total) by mouth 2 (two) times daily.   meloxicam (MOBIC) 15 MG  tablet, TAKE 1 TABLET (15 MG TOTAL) BY MOUTH DAILY.   Current Outpatient Medications (Other):    DULoxetine (CYMBALTA) 20 MG capsule, Take 1 capsule (20 mg total) by mouth daily.   FLUoxetine (PROZAC) 10 MG capsule, Take 1 capsule (10 mg total) by mouth daily.   gabapentin (NEURONTIN) 100 MG capsule, Take 2 capsules (200 mg total) by mouth at bedtime.   Reviewed prior external information including notes and imaging from  primary care provider As well as notes that were available from care everywhere and other healthcare systems.  Past medical history, social, surgical and family history all reviewed in electronic medical record.  No pertanent information unless stated regarding to the chief complaint.   Review of Systems:  No headache, visual changes, nausea, vomiting, diarrhea, constipation, dizziness, abdominal pain, skin rash, fevers, chills, night sweats, weight loss, swollen lymph nodes,  chest pain, shortness of breath, mood changes. POSITIVE muscle aches, body aches, joint swelling  Objective  Blood pressure 120/78, pulse 63, height '5\' 6"'$  (1.676 m), weight 182 lb (82.6 kg), SpO2 98 %.   General: No apparent distress alert and oriented x3 mood and affect normal, dressed appropriately.  HEENT: Pupils equal, extraocular movements intact  Respiratory: Patient's speak in full sentences and does not appear short of breath  Cardiovascular: No lower extremity edema, non tender, no erythema  Right knee exam does have some arthritic changes noted.  Tender to palpation over the paraspinal musculature as well in the lumbar spine.  The patient does have tightness with sidebending bilaterally.    Impression and Recommendations:    The above documentation has been reviewed and is accurate and complete Lyndal Pulley, DO

## 2022-02-07 ENCOUNTER — Ambulatory Visit (INDEPENDENT_AMBULATORY_CARE_PROVIDER_SITE_OTHER): Payer: 59 | Admitting: Family Medicine

## 2022-02-07 ENCOUNTER — Encounter: Payer: Self-pay | Admitting: Family Medicine

## 2022-02-07 VITALS — BP 120/78 | HR 63 | Ht 66.0 in | Wt 182.0 lb

## 2022-02-07 DIAGNOSIS — M2550 Pain in unspecified joint: Secondary | ICD-10-CM | POA: Diagnosis not present

## 2022-02-07 LAB — COMPREHENSIVE METABOLIC PANEL
ALT: 16 U/L (ref 0–35)
AST: 20 U/L (ref 0–37)
Albumin: 4.4 g/dL (ref 3.5–5.2)
Alkaline Phosphatase: 54 U/L (ref 39–117)
BUN: 17 mg/dL (ref 6–23)
CO2: 30 mEq/L (ref 19–32)
Calcium: 9.6 mg/dL (ref 8.4–10.5)
Chloride: 104 mEq/L (ref 96–112)
Creatinine, Ser: 0.63 mg/dL (ref 0.40–1.20)
GFR: 94.02 mL/min (ref >60.00)
Glucose, Bld: 80 mg/dL (ref 70–99)
Potassium: 4.1 mEq/L (ref 3.5–5.1)
Sodium: 141 mEq/L (ref 135–145)
Total Bilirubin: 0.6 mg/dL (ref 0.2–1.2)
Total Protein: 6.9 g/dL (ref 6.0–8.3)

## 2022-02-07 LAB — CBC WITH DIFFERENTIAL/PLATELET
Basophils Absolute: 0 10*3/uL (ref 0.0–0.1)
Basophils Relative: 1.1 % (ref 0.0–3.0)
Eosinophils Absolute: 0.1 10*3/uL (ref 0.0–0.7)
Eosinophils Relative: 3.1 % (ref 0.0–5.0)
HCT: 37.8 % (ref 36.0–46.0)
Hemoglobin: 12.9 g/dL (ref 12.0–15.0)
Lymphocytes Relative: 30.1 % (ref 12.0–46.0)
Lymphs Abs: 1.1 10*3/uL (ref 0.7–4.0)
MCHC: 34 g/dL (ref 30.0–36.0)
MCV: 94.4 fl (ref 78.0–100.0)
Monocytes Absolute: 0.3 10*3/uL (ref 0.1–1.0)
Monocytes Relative: 7.9 % (ref 3.0–12.0)
Neutro Abs: 2 10*3/uL (ref 1.4–7.7)
Neutrophils Relative %: 57.8 % (ref 43.0–77.0)
Platelets: 284 10*3/uL (ref 150.0–400.0)
RBC: 4.01 Mil/uL (ref 3.87–5.11)
RDW: 12.7 % (ref 11.5–15.5)
WBC: 3.5 10*3/uL — ABNORMAL LOW (ref 4.0–10.5)

## 2022-02-07 LAB — FERRITIN: Ferritin: 23.8 ng/mL (ref 10.0–291.0)

## 2022-02-07 LAB — IBC PANEL
Iron: 109 ug/dL (ref 42–145)
Saturation Ratios: 27.7 % (ref 20.0–50.0)
TIBC: 393.4 ug/dL (ref 250.0–450.0)
Transferrin: 281 mg/dL (ref 212.0–360.0)

## 2022-02-07 LAB — VITAMIN B12: Vitamin B-12: 361 pg/mL (ref 211–911)

## 2022-02-07 LAB — SEDIMENTATION RATE: Sed Rate: 26 mm/hr (ref 0–30)

## 2022-02-07 LAB — C-REACTIVE PROTEIN: CRP: <1 mg/dL (ref 0.5–20.0)

## 2022-02-07 LAB — TSH: TSH: 1.28 u[IU]/mL (ref 0.35–5.50)

## 2022-02-07 LAB — VITAMIN D 25 HYDROXY (VIT D DEFICIENCY, FRACTURES): VITD: 24.02 ng/mL — ABNORMAL LOW (ref 30.00–100.00)

## 2022-02-07 LAB — URIC ACID: Uric Acid, Serum: 5.2 mg/dL (ref 2.4–7.0)

## 2022-02-07 MED ORDER — FLUOXETINE HCL 10 MG PO CAPS: 10.0000 mg | ORAL_CAPSULE | Freq: Every day | ORAL | 1 refills | Status: DC

## 2022-02-07 MED ORDER — DULOXETINE HCL 20 MG PO CPEP: 20.0000 mg | ORAL_CAPSULE | Freq: Every day | ORAL | 1 refills | Status: DC

## 2022-02-07 NOTE — Patient Instructions (Addendum)
Good to see you  Lab work today Discontinue '20mg'$  Prozac start the '10mg'$  Prozac  Cymbalta '20mg'$  Continue to be active you are doing the right thing See me again in 6 weeks

## 2022-02-07 NOTE — Assessment & Plan Note (Signed)
Patient has had polyarthralgia previously.  Patient is having worsening symptoms at this moment.  Do feel laboratory workup would be beneficial.  There is a possibility of underlying autoimmune but think it would be highly unlikely.  Fibromyalgia is within the differential as well.  We discussed different medications to help with more of the chronic pain and will start to titrate down off of the Prozac and see if patient would respond better to the Cymbalta.  This could help with some of the underlying arthritic changes.  Patient will follow-up with me again 6 to 8 weeks.

## 2022-02-08 MED ORDER — VITAMIN D (ERGOCALCIFEROL) 1.25 MG (50000 UNIT) PO CAPS: 50000.0000 [IU] | ORAL_CAPSULE | ORAL | 0 refills | Status: DC

## 2022-02-09 LAB — ANA: Anti Nuclear Antibody (ANA): NEGATIVE

## 2022-02-09 LAB — ANGIOTENSIN CONVERTING ENZYME: Angiotensin-Converting Enzyme: 24 U/L (ref 9–67)

## 2022-02-09 LAB — PTH, INTACT AND CALCIUM
Calcium: 9.6 mg/dL (ref 8.6–10.4)
PTH: 44 pg/mL (ref 16–77)

## 2022-02-09 LAB — RHEUMATOID FACTOR: Rheumatoid fact SerPl-aCnc: <14 IU/mL (ref <14)

## 2022-02-09 LAB — CALCIUM, IONIZED: Calcium, Ion: 5.1 mg/dL (ref 4.7–5.5)

## 2022-02-09 LAB — CYCLIC CITRUL PEPTIDE ANTIBODY, IGG: Cyclic Citrullin Peptide Ab: <16 UNITS

## 2022-03-02 ENCOUNTER — Other Ambulatory Visit: Payer: Self-pay | Admitting: Family Medicine

## 2022-03-25 ENCOUNTER — Ambulatory Visit: Payer: 59 | Admitting: Family Medicine

## 2022-04-12 ENCOUNTER — Encounter: Payer: Self-pay | Admitting: Family Medicine

## 2022-04-14 ENCOUNTER — Encounter: Payer: Self-pay | Admitting: Family Medicine

## 2022-04-14 NOTE — Progress Notes (Deleted)
[place] at [facility] [address] [place], [place] [address] [phone]  Date:  04/18/2022   Name:  Kimberly Kent   DOB:  [birth year]   MRN:  [id number]  PCP:  Darreld Mclean, MD    Chief Complaint: No chief complaint on file.   History of Present Illness:  Kimberly Kent is a 67 y.o. very pleasant female patient who presents with the following:  Patient seen today with stomach and back pain I saw her most recently for a virtual visit in July She contacted [place] via [place] on 2/16 Hi Dr. Lorelei Pont, for a couple of weeks now I've been dealing with upper stomach and back pain in the same area.  A lot of gas and burping and over-all not feeling good.  I've been doing great working out and feeling good and now something has started.  I think this is the same thing that happened once before when you had a CT scan done.  Is it possible to have that done again to rule out anything bad? Also a lot of stomach cramping. All the symptoms are inline with pancreatitis is the best I can explain.   I can certainly come in first if you need me to.    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   Subacute cough 05/30/2021   Arthritis of midfoot 04/17/2021   Left ankle pain 01/16/2021   Rib fracture 12/05/2020   Piriformis syndrome of right side 10/02/2020   Right hamstring muscle strain 06/15/2020   Greater trochanteric bursitis of right hip 04/30/2018   Polyarthralgia 04/03/2018   Degenerative arthritis of right knee 04/03/2018   Lumbar spinal stenosis 04/03/2018   PFO (patent foramen ovale) 01/13/2018   Vitamin D deficiency 03/06/2014   Migraine with aura 05/20/2011   Hypothyroid 05/20/2011   SHORTNESS OF BREATH 12/22/2009   TRANSIENT VISUAL LOSS 07/13/2008    Past Medical History:  Diagnosis Date    Anemia    Anxiety    Arthritis    Blood transfusion without reported diagnosis    Depression    Fibromyalgia 12/29/2013   Hyperplastic colon polyp    Migraines    Neuromuscular disorder ([place])    Patent foramen ovale    Use filter on all IV fluids due to PFO   Post-operative nausea and vomiting    Special circumstances    Use filter on all IV tubing due to PFO   Thyroid disease    Vitamin D deficiency     Past Surgical History:  Procedure Laterality Date   ABDOMINAL HYSTERECTOMY     COLONOSCOPY  04/06/2009   ENDOVENOUS ABLATION SAPHENOUS VEIN W/ LASER Left 2010   Approx 2010 per patient, Has LLE EVLT at [facility] in [place], had a reaction to Tumescent??? given during the procedure, could not follow through with procedure on Right leg.   KNEE ARTHROSCOPY     right knee/arthroscopic   TONSILLECTOMY     UPPER GASTROINTESTINAL ENDOSCOPY  2015    Social History   Tobacco Use   Smoking status: Never   Smokeless tobacco: Never  Vaping Use   Vaping Use: Never used  Substance Use Topics   Alcohol use: Yes    Comment: occ   Drug use: No    Family History  Problem Relation Age of Onset   Cancer Mother    Cancer Father    Diabetes Father    Parkinson's disease Maternal Grandmother    Cancer Maternal Grandfather    Colon cancer Neg Hx    Colon polyps Neg Hx    Esophageal cancer Neg Hx    Rectal cancer Neg Hx    Stomach cancer Neg Hx     Allergies  Allergen Reactions   Bupropion Hcl     REACTION: hives   Codeine     REACTION: nausea and vomiting    Medication list has been reviewed and updated.  Current Outpatient Medications on File Prior to Visit  Medication Sig Dispense Refill   celecoxib (CELEBREX) 200 MG capsule Take 1 capsule (200 mg total) by mouth 2 (two) times daily. 60 capsule 0   DULoxetine (CYMBALTA) 20 MG capsule TAKE 1 CAPSULE BY MOUTH EVERY DAY 90 capsule 1   FLUoxetine (PROZAC) 10 MG capsule Take 1 capsule (10 mg total) by mouth daily. 30  capsule 1   gabapentin (NEURONTIN) 100 MG capsule Take 2 capsules (200 mg total) by mouth at bedtime. 180 capsule 1   levothyroxine (SYNTHROID) 75 MCG tablet TAKE 1 TABLET BY MOUTH DAILY BEFORE BREAKFAST. 90 tablet 1   meloxicam (MOBIC) 15 MG tablet TAKE 1 TABLET (15 MG TOTAL) BY MOUTH DAILY. 90 tablet 1   Vitamin D, Ergocalciferol, (DRISDOL) 1.25 MG (50000 UNIT) CAPS capsule Take 1 capsule (50,000 Units total) by mouth every 7 (seven) days. 12 capsule 0   No current facility-administered medications on file prior to visit.    Review of Systems:  ***  Physical Examination: There were no vitals filed for this visit. There were no vitals filed for this visit. There is no height or weight on file to calculate BMI. Ideal Body Weight:    ***  Assessment and Plan: ***  Signed Lamar Blinks, MD

## 2022-04-14 NOTE — Progress Notes (Unsigned)
[place] at [facility] [address], [place], [place] [address] [phone]  Date:  04/18/2022   Name:  Kimberly Kent   DOB:  November 30, 1955   MRN:  [id number]  PCP:  Darreld Mclean, MD    Chief Complaint: No chief complaint on file.   History of Present Illness:  Kimberly Kent is a 67 y.o. very pleasant female patient who presents with the following:  Patient seen today with stomach and back pain I saw her most recently for a virtual visit in July History of persistent cough, hypothyroidism, migraine headache, PFO She contacted [place] via [place] on 2/16 Hi Dr. Lorelei Pont, for a couple of weeks now I've been dealing with upper stomach and back pain in the same area.  A lot of gas and burping and over-all not feeling good.  I've been doing great working out and feeling good and now something has started.  I think this is the same thing that happened once before when you had a CT scan done.  Is it possible to have that done again to rule out anything bad? Also a lot of stomach cramping. All the symptoms are inline with pancreatitis is the best I can explain.   I can certainly come in first if you need me to.    She has noted burping, gas, crampy feeling in her epigastric area for a week or so She has lower back pain at baseline-this may be radiating a bit higher into her mid back at this time No vomiting No fever or chills Not worse after eating She feels ok in the early am- once she gets going however the discomfort begins She tried miralax just once- she is not taking a PPI or other antiacid medication  Not worse with activity- she is working out more and does not have any CP or SOB with activity or otherwise-she is started going to [employer] for exercise which is pretty intense  Stress 11/22- negative   Mammo per [facility] Her gyn is Dr Gita Kudo- I will request records   Patient Active Problem List   Diagnosis Date Noted   Greater  trochanteric bursitis of left hip 11/01/2021   Morton's neuroma of left foot 07/03/2021   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   Subacute cough 05/30/2021   Arthritis of midfoot 04/17/2021   Left ankle pain 01/16/2021   Rib fracture 12/05/2020   Piriformis syndrome of right side 10/02/2020   Right hamstring muscle strain 06/15/2020   Greater trochanteric bursitis of right hip 04/30/2018   Polyarthralgia 04/03/2018   Degenerative arthritis of right knee 04/03/2018   Lumbar spinal stenosis 04/03/2018   PFO (patent foramen ovale) 01/13/2018   Vitamin D deficiency 03/06/2014   Migraine with aura 05/20/2011   Hypothyroid 05/20/2011   SHORTNESS OF BREATH 12/22/2009   TRANSIENT VISUAL LOSS 07/13/2008    Past Medical History:  Diagnosis Date   Anemia    Anxiety    Arthritis    Blood transfusion without reported diagnosis    Depression    Fibromyalgia 12/29/2013   Hyperplastic colon polyp    Migraines    Neuromuscular disorder ([place])    Patent foramen ovale    Use filter on all IV fluids due to PFO   Post-operative nausea and vomiting    Special circumstances    Use filter on all IV tubing due to PFO   Thyroid disease    Vitamin D deficiency  Past Surgical History:  Procedure Laterality Date   ABDOMINAL HYSTERECTOMY     COLONOSCOPY  04/06/2009   ENDOVENOUS ABLATION SAPHENOUS VEIN W/ LASER Left 2010   Approx 2010 per patient, Has LLE EVLT at [facility] in [place], had a reaction to Tumescent??? given during the procedure, could not follow through with procedure on Right leg.   KNEE ARTHROSCOPY     right knee/arthroscopic   TONSILLECTOMY     UPPER GASTROINTESTINAL ENDOSCOPY  2015    Social History   Tobacco Use   Smoking status: Never   Smokeless tobacco: Never  Vaping Use   Vaping Use: Never used  Substance Use Topics   Alcohol use: Yes    Comment: occ   Drug use: No    Family History  Problem Relation Age of Onset   Cancer Mother    Cancer Father     Diabetes Father    Parkinson's disease Maternal Grandmother    Cancer Maternal Grandfather    Colon cancer Neg Hx    Colon polyps Neg Hx    Esophageal cancer Neg Hx    Rectal cancer Neg Hx    Stomach cancer Neg Hx     Allergies  Allergen Reactions   Bupropion Hcl     REACTION: hives   Codeine     REACTION: nausea and vomiting    Medication list has been reviewed and updated.  Current Outpatient Medications on File Prior to Visit  Medication Sig Dispense Refill   celecoxib (CELEBREX) 200 MG capsule Take 1 capsule (200 mg total) by mouth 2 (two) times daily. 60 capsule 0   DULoxetine (CYMBALTA) 20 MG capsule TAKE 1 CAPSULE BY MOUTH EVERY DAY 90 capsule 1   FLUoxetine (PROZAC) 10 MG capsule Take 1 capsule (10 mg total) by mouth daily. 30 capsule 1   gabapentin (NEURONTIN) 100 MG capsule Take 2 capsules (200 mg total) by mouth at bedtime. 180 capsule 1   levothyroxine (SYNTHROID) 75 MCG tablet TAKE 1 TABLET BY MOUTH DAILY BEFORE BREAKFAST. 90 tablet 1   meloxicam (MOBIC) 15 MG tablet TAKE 1 TABLET (15 MG TOTAL) BY MOUTH DAILY. 90 tablet 1   Vitamin D, Ergocalciferol, (DRISDOL) 1.25 MG (50000 UNIT) CAPS capsule Take 1 capsule (50,000 Units total) by mouth every 7 (seven) days. 12 capsule 0   No current facility-administered medications on file prior to visit.    Review of Systems:  As per HPI- otherwise negative.   Physical Examination: There were no vitals filed for this visit. There were no vitals filed for this visit. There is no height or weight on file to calculate BMI. Ideal Body Weight:   Blood pressure 134/70, pulse 60, respiration 18, temperature 97.7 degrees, O2 sat 100% GEN: no acute distress.  Mildly overweight, looks well HEENT: Atraumatic, Normocephalic.  Ears and Nose: No external deformity. CV: RRR, No M/G/R. No JVD. No thrill. No extra heart sounds. PULM: CTA B, no wheezes, crackles, rhonchi. No retractions. No resp. distress. No accessory muscle use. ABD:  S, ND, +BS. No rebound. No HSM. EXTR: No c/c/e PSYCH: Normally interactive. Conversant.  Epigastic tenderness is present, neg murphy sign '  EKG: SR, RSR' in VI which is stable, no change from 6/23  Assessment and Plan: Epigastric pain - Plan: EKG 12-Lead, CBC, Comprehensive metabolic panel, H. pylori breath test, sucralfate (CARAFATE) 1 g tablet, pantoprazole (PROTONIX) 40 MG tablet, Lipase  Patient seen today with concern of epigastric pain.  This seems to be most consistent  with gastritis/GERD.  EKG is reassuring.  Will have her start a PPI and Carafate.  Blood work is pending as above I have asked her to strictly limit use of NSAIDs, and let me know if she is getting worse or not improving Signed Lamar Blinks, MD  Received labs as below, message to patient Results for orders placed or performed in visit on 04/15/22  CBC  Result Value Ref Range   WBC 5.2 4.0 - 10.5 K/uL   RBC 4.15 3.87 - 5.11 Mil/uL   Platelets 287.0 150.0 - 400.0 K/uL   Hemoglobin 13.1 12.0 - 15.0 g/dL   HCT 39.1 36.0 - 46.0 %   MCV 94.2 78.0 - 100.0 fl   MCHC 33.4 30.0 - 36.0 g/dL   RDW 12.4 11.5 - 15.5 %  Comprehensive metabolic panel  Result Value Ref Range   Sodium 139 135 - 145 mEq/L   Potassium 4.4 3.5 - 5.1 mEq/L   Chloride 103 96 - 112 mEq/L   CO2 30 19 - 32 mEq/L   Glucose, Bld 76 70 - 99 mg/dL   BUN 14 6 - 23 mg/dL   Creatinine, Ser 0.66 0.40 - 1.20 mg/dL   Total Bilirubin 0.7 0.2 - 1.2 mg/dL   Alkaline Phosphatase 68 39 - 117 U/L   AST 22 0 - 37 U/L   ALT 15 0 - 35 U/L   Total Protein 6.5 6.0 - 8.3 g/dL   Albumin 4.3 3.5 - 5.2 g/dL   GFR 92.85 >60.00 mL/min   Calcium 9.7 8.4 - 10.5 mg/dL  Lipase  Result Value Ref Range   Lipase 21.0 11.0 - 59.0 U/L

## 2022-04-15 ENCOUNTER — Encounter: Payer: Self-pay | Admitting: Family Medicine

## 2022-04-15 ENCOUNTER — Ambulatory Visit (INDEPENDENT_AMBULATORY_CARE_PROVIDER_SITE_OTHER): Payer: 59 | Admitting: Family Medicine

## 2022-04-15 VITALS — BP 134/70 | HR 60 | Temp 97.7°F | Resp 18 | Ht 66.0 in | Wt 177.4 lb

## 2022-04-15 DIAGNOSIS — R1013 Epigastric pain: Secondary | ICD-10-CM | POA: Diagnosis not present

## 2022-04-15 LAB — COMPREHENSIVE METABOLIC PANEL
ALT: 15 U/L (ref 0–35)
AST: 22 U/L (ref 0–37)
Albumin: 4.3 g/dL (ref 3.5–5.2)
Alkaline Phosphatase: 68 U/L (ref 39–117)
BUN: 14 mg/dL (ref 6–23)
CO2: 30 mEq/L (ref 19–32)
Calcium: 9.7 mg/dL (ref 8.4–10.5)
Chloride: 103 mEq/L (ref 96–112)
Creatinine, Ser: 0.66 mg/dL (ref 0.40–1.20)
GFR: 92.85 mL/min (ref >60.00)
Glucose, Bld: 76 mg/dL (ref 70–99)
Potassium: 4.4 mEq/L (ref 3.5–5.1)
Sodium: 139 mEq/L (ref 135–145)
Total Bilirubin: 0.7 mg/dL (ref 0.2–1.2)
Total Protein: 6.5 g/dL (ref 6.0–8.3)

## 2022-04-15 LAB — CBC
HCT: 39.1 % (ref 36.0–46.0)
Hemoglobin: 13.1 g/dL (ref 12.0–15.0)
MCHC: 33.4 g/dL (ref 30.0–36.0)
MCV: 94.2 fl (ref 78.0–100.0)
Platelets: 287 10*3/uL (ref 150.0–400.0)
RBC: 4.15 Mil/uL (ref 3.87–5.11)
RDW: 12.4 % (ref 11.5–15.5)
WBC: 5.2 10*3/uL (ref 4.0–10.5)

## 2022-04-15 LAB — LIPASE: Lipase: 21 U/L (ref 11.0–59.0)

## 2022-04-15 MED ORDER — PANTOPRAZOLE SODIUM 40 MG PO TBEC: 40.0000 mg | DELAYED_RELEASE_TABLET | Freq: Every day | ORAL | 3 refills | Status: DC

## 2022-04-15 MED ORDER — SUCRALFATE 1 G PO TABS: 1.0000 g | ORAL_TABLET | Freq: Three times a day (TID) | ORAL | 1 refills | Status: DC

## 2022-04-15 NOTE — Patient Instructions (Addendum)
It was good to see you today- I am sorry you are not feeling great.  I will be in touch with your labs asap  Start on the pantoprazole once daily and the carafate with meals/ bedtime for about 10 days Try to stay await from NSAID medications and use tylenol for back pain as much as you can   Please let me know if anything is changing or getting worse

## 2022-04-18 ENCOUNTER — Ambulatory Visit: Payer: 59 | Admitting: Family Medicine

## 2022-04-18 LAB — H. PYLORI BREATH TEST: H. pylori Breath Test: NOT DETECTED

## 2022-04-19 ENCOUNTER — Encounter: Payer: Self-pay | Admitting: Family Medicine

## 2022-05-14 ENCOUNTER — Other Ambulatory Visit: Payer: Self-pay | Admitting: Family Medicine

## 2022-05-14 DIAGNOSIS — F428 Other obsessive-compulsive disorder: Secondary | ICD-10-CM

## 2022-06-03 ENCOUNTER — Other Ambulatory Visit: Payer: Self-pay | Admitting: Family Medicine

## 2022-06-03 ENCOUNTER — Encounter: Payer: Self-pay | Admitting: Family Medicine

## 2022-06-03 DIAGNOSIS — M48062 Spinal stenosis, lumbar region with neurogenic claudication: Secondary | ICD-10-CM

## 2022-06-03 MED ORDER — GABAPENTIN 100 MG PO CAPS: 200.0000 mg | ORAL_CAPSULE | Freq: Every day | ORAL | 1 refills | Status: DC

## 2022-06-05 ENCOUNTER — Other Ambulatory Visit: Payer: Self-pay

## 2022-06-05 ENCOUNTER — Encounter: Payer: Self-pay | Admitting: Family Medicine

## 2022-06-05 MED ORDER — PREDNISONE 20 MG PO TABS: 20.0000 mg | ORAL_TABLET | Freq: Every day | ORAL | 0 refills | Status: DC

## 2022-07-11 ENCOUNTER — Other Ambulatory Visit: Payer: Self-pay | Admitting: Family Medicine

## 2022-07-11 DIAGNOSIS — R1013 Epigastric pain: Secondary | ICD-10-CM

## 2022-08-08 ENCOUNTER — Encounter: Payer: Self-pay | Admitting: Family Medicine

## 2022-08-08 DIAGNOSIS — M48062 Spinal stenosis, lumbar region with neurogenic claudication: Secondary | ICD-10-CM

## 2022-08-08 MED ORDER — LEVOTHYROXINE SODIUM 75 MCG PO TABS: 75.0000 ug | ORAL_TABLET | Freq: Every day | ORAL | 1 refills | Status: DC

## 2022-08-08 MED ORDER — GABAPENTIN 100 MG PO CAPS
200.0000 mg | ORAL_CAPSULE | Freq: Every day | ORAL | 1 refills | Status: DC
Start: 2022-08-08 — End: 2022-12-23

## 2022-09-11 ENCOUNTER — Other Ambulatory Visit: Payer: Self-pay | Admitting: Family Medicine

## 2022-09-11 DIAGNOSIS — F428 Other obsessive-compulsive disorder: Secondary | ICD-10-CM

## 2022-09-12 NOTE — Progress Notes (Signed)
[facility] [address] [place] [address] Phone: [phone] Subjective:   Kimberly Kent, am serving as a scribe for Dr. Antoine Primas.  I'm seeing this patient by the request  of:  Copland, Gwenlyn Found, MD  CC: Left hip and knee pain  [id number]  02/07/2022 Patient has had polyarthralgia previously.  Patient is having worsening symptoms at this moment.  Do feel laboratory workup would be beneficial.  There is a possibility of underlying autoimmune but think it would be highly unlikely.  Fibromyalgia is within the differential as well.  We discussed different medications to help with more of the chronic pain and will start to titrate down off of the Prozac and see if patient would respond better to the Cymbalta.  This could help with some of the underlying arthritic changes.  Patient will follow-up with me again 6 to 8 weeks.     Update 09/17/2202 Kimberly Kent is a 67 y.o. female coming in with complaint of L hip and L knee pain. Patient states thinks it's back or hip. Pain will flare down to ankle sometimes. Pain in night wakes her sometimes. Has to walk with a limp because knee feels like it about to give way. Tried taking the prednisone, but stopped because fo side effects. Riding bike is okay, standing and walking produce the pain.      Past Medical History:  Diagnosis Date   Anemia    Anxiety    Arthritis    Blood transfusion without reported diagnosis    Depression    Fibromyalgia 12/29/2013   Hyperplastic colon polyp    Migraines    Neuromuscular disorder (HCC)    Patent foramen ovale    Use filter on all IV fluids due to PFO   Post-operative nausea and vomiting    Special circumstances    Use filter on all IV tubing due to PFO   Thyroid disease    Vitamin D deficiency    Past Surgical History:  Procedure Laterality Date   ABDOMINAL HYSTERECTOMY     COLONOSCOPY  04/06/2009   ENDOVENOUS ABLATION SAPHENOUS VEIN W/ LASER Left  2010   Approx 2010 per patient, Has LLE EVLT at [facility] in [place], had a reaction to Tumescent??? given during the procedure, could not follow through with procedure on Right leg.   KNEE ARTHROSCOPY     right knee/arthroscopic   TONSILLECTOMY     UPPER GASTROINTESTINAL ENDOSCOPY  2015   Social History   Socioeconomic History   Marital status: Married    Spouse name: Not on file   Number of children: Not on file   Years of education: Not on file   Highest education level: Not on file  Occupational History   Occupation: mortgage industry Environmental health practitioner  Tobacco Use   Smoking status: Never   Smokeless tobacco: Never  Vaping Use   Vaping status: Never Used  Substance and Sexual Activity   Alcohol use: Yes    Comment: occ   Drug use: No   Sexual activity: Not on file  Other Topics Concern   Not on file  Social History Narrative   Not on file   Social Determinants of Health   Financial Resource Strain: Not on file  Food Insecurity: Not on file  Transportation Needs: Not on file  Physical Activity: Not on file  Stress: Not on file  Social Connections: Not on file   Allergies  Allergen Reactions  Bupropion Hcl     REACTION: hives   Codeine     REACTION: nausea and vomiting   Family History  Problem Relation Age of Onset   Cancer Mother    Cancer Father    Diabetes Father    Parkinson's disease Maternal Grandmother    Cancer Maternal Grandfather    Colon cancer Neg Hx    Colon polyps Neg Hx    Esophageal cancer Neg Hx    Rectal cancer Neg Hx    Stomach cancer Neg Hx     Current Outpatient Medications (Endocrine & Metabolic):    predniSONE (DELTASONE) 20 MG tablet, Take 1 tablet (20 mg total) by mouth daily with breakfast.   levothyroxine (SYNTHROID) 75 MCG tablet, Take 1 tablet (75 mcg total) by mouth daily before breakfast.    Current Outpatient Medications (Analgesics):    celecoxib (CELEBREX) 200 MG capsule, Take 1 capsule (200 mg total) by mouth  2 (two) times daily.   meloxicam (MOBIC) 15 MG tablet, TAKE 1 TABLET (15 MG TOTAL) BY MOUTH DAILY.   Current Outpatient Medications (Other):    DULoxetine (CYMBALTA) 20 MG capsule, TAKE 1 CAPSULE BY MOUTH EVERY DAY   FLUoxetine (PROZAC) 10 MG capsule, TAKE 1 CAPSULE BY MOUTH EVERY DAY   gabapentin (NEURONTIN) 100 MG capsule, Take 2 capsules (200 mg total) by mouth at bedtime.   pantoprazole (PROTONIX) 40 MG tablet, TAKE 1 TABLET BY MOUTH EVERY DAY   sucralfate (CARAFATE) 1 g tablet, Take 1 tablet (1 g total) by mouth 4 (four) times daily -  with meals and at bedtime.   Vitamin D, Ergocalciferol, (DRISDOL) 1.25 MG (50000 UNIT) CAPS capsule, Take 1 capsule (50,000 Units total) by mouth every 7 (seven) days.   Reviewed prior external information including notes and imaging from  primary care provider As well as notes that were available from care everywhere and other healthcare systems.  Past medical history, social, surgical and family history all reviewed in electronic medical record.  No pertanent information unless stated regarding to the chief complaint.   Review of Systems:  No headache, visual changes, nausea, vomiting, diarrhea, constipation, dizziness, abdominal pain, skin rash, fevers, chills, night sweats, weight loss, swollen lymph nodes, body aches, joint swelling, chest pain, shortness of breath, mood changes. POSITIVE muscle aches  Objective  Blood pressure 138/86, pulse 74, height 5\' 6"  (1.676 m), weight 180 lb (81.6 kg), SpO2 99%.   General: No apparent distress alert and oriented x3 mood and affect normal, dressed appropriately.  HEENT: Pupils equal, extraocular movements intact  Respiratory: Patient's speak in full sentences and does not appear short of breath  Cardiovascular: No lower extremity edema, non tender, no erythema  Antalgic gait noted. Patient does have some limited range of motion of the left hip especially with internal range of motion but also with FABER  test.  Severe tenderness over the sacroiliac joint as well as over the greater trochanteric area.  More pain now in the groin area as well with internal rotation.   After verbal consent patient was prepped with alcohol swab and with a 21-gauge 2 inch needle injected into the left greater trochanteric area with 2 cc of 0.5% Marcaine and 1 cc of Kenalog 40 mg/mL.  No blood loss.  Band-Aid placed.  Postinjection instructions given  After verbal consent patient was prepped with alcohol swab and with a 21-gauge 2 inch needle injected into the left sacroiliac joint with a total of 1 cc of 0.5% Marcaine and  1 cc of Kenalog 40 mg/mL.  No blood loss.  Band-Aid placed.  Postinjection instructions given.    Impression and Recommendations:    The above documentation has been reviewed and is accurate and complete Judi Saa, DO

## 2022-09-17 ENCOUNTER — Ambulatory Visit (INDEPENDENT_AMBULATORY_CARE_PROVIDER_SITE_OTHER): Payer: 59 | Admitting: Family Medicine

## 2022-09-17 ENCOUNTER — Encounter: Payer: Self-pay | Admitting: Family Medicine

## 2022-09-17 VITALS — BP 138/86 | HR 74 | Ht 66.0 in | Wt 180.0 lb

## 2022-09-17 DIAGNOSIS — G8929 Other chronic pain: Secondary | ICD-10-CM | POA: Diagnosis not present

## 2022-09-17 DIAGNOSIS — M48062 Spinal stenosis, lumbar region with neurogenic claudication: Secondary | ICD-10-CM

## 2022-09-17 DIAGNOSIS — M7062 Trochanteric bursitis, left hip: Secondary | ICD-10-CM

## 2022-09-17 DIAGNOSIS — M533 Sacrococcygeal disorders, not elsewhere classified: Secondary | ICD-10-CM | POA: Diagnosis not present

## 2022-09-17 NOTE — Assessment & Plan Note (Signed)
Chronic problem and given an injection again.  Differential includes a lumbar radiculopathy that I think is contributing.  We discussed with patient that I do feel with some limited range of motion of the hip that I am concerned there is some underlying hip arthritis as well.  Discussed which activities to do and which ones to avoid.  Increase activity slowly.  Follow-up again in 6 to 8 weeks otherwise.  No worsening pain would be concerned about to get an MRI to further evaluate lumbar radiculopathy versus underlying arthritis.

## 2022-09-17 NOTE — Patient Instructions (Signed)
GT and SI injection today Write [place] in 2 weeks if not better we can get an MRI of back and L hip Ice after activity and before bed See you again in 6-8 weeks

## 2022-09-17 NOTE — Assessment & Plan Note (Signed)
Injection given and tolerated the procedure well, discussed icing regimen and home exercises, discussed which activities to do and which ones to avoid.  Increase activity slowly.  Follow-up with me again in 6 to 8 weeks

## 2022-09-20 ENCOUNTER — Other Ambulatory Visit: Payer: Self-pay

## 2022-09-20 ENCOUNTER — Encounter: Payer: Self-pay | Admitting: Family Medicine

## 2022-09-20 DIAGNOSIS — M25552 Pain in left hip: Secondary | ICD-10-CM

## 2022-09-20 DIAGNOSIS — M25562 Pain in left knee: Secondary | ICD-10-CM

## 2022-09-20 DIAGNOSIS — M545 Low back pain, unspecified: Secondary | ICD-10-CM

## 2022-09-23 ENCOUNTER — Telehealth: Payer: Self-pay

## 2022-09-23 NOTE — Telephone Encounter (Signed)
Patient does not have x rays of the left knee or hip in for me to get these approved. I have the low back x rays but patient will need to come in to get those x rays before I can try and approved the MRI's. Also may have a hard time getting the left knee approved because the only documentation I have is in Mychart messages.   I see these are ordered but she has not gotten them yet

## 2022-09-25 ENCOUNTER — Other Ambulatory Visit: Payer: Self-pay

## 2022-09-25 DIAGNOSIS — M545 Low back pain, unspecified: Secondary | ICD-10-CM

## 2022-09-29 ENCOUNTER — Ambulatory Visit (INDEPENDENT_AMBULATORY_CARE_PROVIDER_SITE_OTHER): Payer: 59

## 2022-09-29 DIAGNOSIS — M25552 Pain in left hip: Secondary | ICD-10-CM

## 2022-09-29 DIAGNOSIS — M25562 Pain in left knee: Secondary | ICD-10-CM

## 2022-09-29 DIAGNOSIS — M545 Low back pain, unspecified: Secondary | ICD-10-CM | POA: Diagnosis not present

## 2022-10-06 ENCOUNTER — Other Ambulatory Visit: Payer: 59

## 2022-10-09 NOTE — Progress Notes (Unsigned)
[facility] [address] [place] [address] Phone: [phone] Subjective:   Kimberly Kent, am serving as a scribe for Dr. Antoine Kent.  I'm seeing this patient by the request  of:  Kimberly Kent, Kimberly Found, Kimberly Kent  CC: Left leg pain  [id number]  09/17/2022 Injection given and tolerated the procedure well, discussed icing regimen and home exercises, discussed which activities to do and which ones to avoid.  Increase activity slowly.  Follow-up with me again in 6 to 8 weeks     Chronic problem and given an injection again.  Differential includes a lumbar radiculopathy that I think is contributing.  We discussed with patient that I do feel with some limited range of motion of the hip that I am concerned there is some underlying hip arthritis as well.  Discussed which activities to do and which ones to avoid.  Increase activity slowly.  Follow-up again in 6 to 8 weeks otherwise.  No worsening pain would be concerned about to get an MRI to further evaluate lumbar radiculopathy versus underlying arthritis.      Update 10/10/2022 Kimberly Kent is a 67 y.o. female coming in with complaint of L hip and L knee pain. Unsure if pain is coming from back or hip but knee is most painful recently. Patient states  that her L knee is swollen and painful over anterior aspect. Pain is constant and at night she has pain rolling over in the bed. Biking does not produce pain but walking does.   MRI of patient's left knee on August 13 showed large radial tear of the posterior horn of the medial meniscus and advanced tricompartmental osteoarthritic changes with an OCD on the lateral femoral condyle     Past Medical History:  Diagnosis Date   Anemia    Anxiety    Arthritis    Blood transfusion without reported diagnosis    Depression    Fibromyalgia 12/29/2013   Hyperplastic colon polyp    Migraines    Neuromuscular disorder (HCC)    Patent foramen ovale    Use filter on  all IV fluids due to PFO   Post-operative nausea and vomiting    Special circumstances    Use filter on all IV tubing due to PFO   Thyroid disease    Vitamin D deficiency    Past Surgical History:  Procedure Laterality Date   ABDOMINAL HYSTERECTOMY     COLONOSCOPY  04/06/2009   ENDOVENOUS ABLATION SAPHENOUS VEIN W/ LASER Left 2010   Approx 2010 per patient, Has LLE EVLT at [facility] in [place], had a reaction to Tumescent??? given during the procedure, could not follow through with procedure on Right leg.   KNEE ARTHROSCOPY     right knee/arthroscopic   TONSILLECTOMY     UPPER GASTROINTESTINAL ENDOSCOPY  2015   Social History   Socioeconomic History   Marital status: Married    Spouse name: Not on file   Number of children: Not on file   Years of education: Not on file   Highest education level: Not on file  Occupational History   Occupation: mortgage industry Environmental health practitioner  Tobacco Use   Smoking status: Never   Smokeless tobacco: Never  Vaping Use   Vaping status: Never Used  Substance and Sexual Activity   Alcohol use: Yes    Comment: occ   Drug use: No   Sexual activity: Not on file  Other Topics Concern   Not  on file  Social History Narrative   Not on file   Social Determinants of Health   Financial Resource Strain: Not on file  Food Insecurity: Not on file  Transportation Needs: Not on file  Physical Activity: Not on file  Stress: Not on file  Social Connections: Not on file   Allergies  Allergen Reactions   Bupropion Hcl     REACTION: hives   Codeine     REACTION: nausea and vomiting   Family History  Problem Relation Age of Onset   Cancer Mother    Cancer Father    Diabetes Father    Parkinson's disease Maternal Grandmother    Cancer Maternal Grandfather    Colon cancer Neg Hx    Colon polyps Neg Hx    Esophageal cancer Neg Hx    Rectal cancer Neg Hx    Stomach cancer Neg Hx     Current Outpatient Medications (Endocrine &  Metabolic):    levothyroxine (SYNTHROID) 75 MCG tablet, Take 1 tablet (75 mcg total) by mouth daily before breakfast.    Current Outpatient Medications (Analgesics):    meloxicam (MOBIC) 15 MG tablet, TAKE 1 TABLET (15 MG TOTAL) BY MOUTH DAILY.   naproxen (NAPROSYN) 500 MG tablet, Take 1 tablet (500 mg total) by mouth 2 (two) times daily as needed.   Current Outpatient Medications (Other):    FLUoxetine (PROZAC) 10 MG capsule, TAKE 1 CAPSULE BY MOUTH EVERY DAY   gabapentin (NEURONTIN) 100 MG capsule, Take 2 capsules (200 mg total) by mouth at bedtime.   pantoprazole (PROTONIX) 40 MG tablet, TAKE 1 TABLET BY MOUTH EVERY DAY   Reviewed prior external information including notes and imaging from  primary care provider As well as notes that were available from care everywhere and other healthcare systems.  Past medical history, social, surgical and family history all reviewed in electronic medical record.  No pertanent information unless stated regarding to the chief complaint.   Review of Systems:  No headache, visual changes, nausea, vomiting, diarrhea, constipation, dizziness, abdominal pain, skin rash, fevers, chills, night sweats, weight loss, swollen lymph nodes, body aches, joint swelling, chest pain, shortness of breath, mood changes. POSITIVE muscle aches  Objective  Blood pressure 116/74, pulse 63, height 5\' 6"  (1.676 m), weight 178 lb (80.7 kg), SpO2 98%.   General: No apparent distress alert and oriented x3 mood and affect normal, dressed appropriately.  HEENT: Pupils equal, extraocular movements intact  Respiratory: Patient's speak in full sentences and does not appear short of breath  Cardiovascular: No lower extremity edema, non tender, no erythema  Left leg does have some instability noted of the knee.  Crepitus noted.  Trace effusion noted.  After informed written and verbal consent, patient was seated on exam table. Left knee was prepped with alcohol swab and utilizing  anterolateral approach, patient's left knee space was injected with 4:1  marcaine 0.5%: Kenalog 40mg /dL. Patient tolerated the procedure well without immediate complications.    Impression and Recommendations:     The above documentation has been reviewed and is accurate and complete Judi Saa, DO

## 2022-10-10 ENCOUNTER — Encounter: Payer: Self-pay | Admitting: Family Medicine

## 2022-10-10 ENCOUNTER — Ambulatory Visit (INDEPENDENT_AMBULATORY_CARE_PROVIDER_SITE_OTHER): Payer: 59 | Admitting: Family Medicine

## 2022-10-10 VITALS — BP 116/74 | HR 63 | Ht 66.0 in | Wt 178.0 lb

## 2022-10-10 DIAGNOSIS — M1712 Unilateral primary osteoarthritis, left knee: Secondary | ICD-10-CM

## 2022-10-10 MED ORDER — NAPROXEN 500 MG PO TABS: 500.0000 mg | ORAL_TABLET | Freq: Two times a day (BID) | ORAL | 0 refills | Status: DC | PRN

## 2022-10-10 NOTE — Patient Instructions (Signed)
Send message about new insurance in September Will get gel approved Injected L knee today See me in 6 weeks

## 2022-10-10 NOTE — Assessment & Plan Note (Addendum)
Patient given injection today.  We did discuss with patient also having the MRI showing that there is some meniscal injury we could consider the possibility of surgical intervention.  Patient declined that at the moment.  Will see if she is a candidate for viscosupplementation as a possibility as well.  Discussed with patient about bracing and given a hinged brace to help with stability.  Patient will be doing a lot of hiking and continues to want to do racquet sports.  Follow-up with me again in 6 to 8 weeks.  Naproxen also prescribed today.  Will use this instead of the meloxicam patient has been prescribed previously

## 2022-10-31 ENCOUNTER — Ambulatory Visit: Payer: 59 | Admitting: Family Medicine

## 2022-11-11 ENCOUNTER — Encounter: Payer: Self-pay | Admitting: Family Medicine

## 2022-11-11 DIAGNOSIS — C4490 Unspecified malignant neoplasm of skin, unspecified: Secondary | ICD-10-CM | POA: Insufficient documentation

## 2022-11-21 ENCOUNTER — Ambulatory Visit: Payer: 59 | Admitting: Family Medicine

## 2022-12-22 ENCOUNTER — Other Ambulatory Visit: Payer: Self-pay | Admitting: Family Medicine

## 2022-12-22 DIAGNOSIS — M48062 Spinal stenosis, lumbar region with neurogenic claudication: Secondary | ICD-10-CM

## 2023-02-03 ENCOUNTER — Other Ambulatory Visit: Payer: Self-pay | Admitting: Family Medicine

## 2023-03-01 IMAGING — DX DG FOOT COMPLETE 3+V*L*
3 series · 3 of 3 positions shown · non-contrast
Comparison: None.

CLINICAL DATA: Left foot trauma five days ago.

EXAM:
LEFT FOOT - COMPLETE 3+ VIEW

[dg foot complete left (1 of 3)]
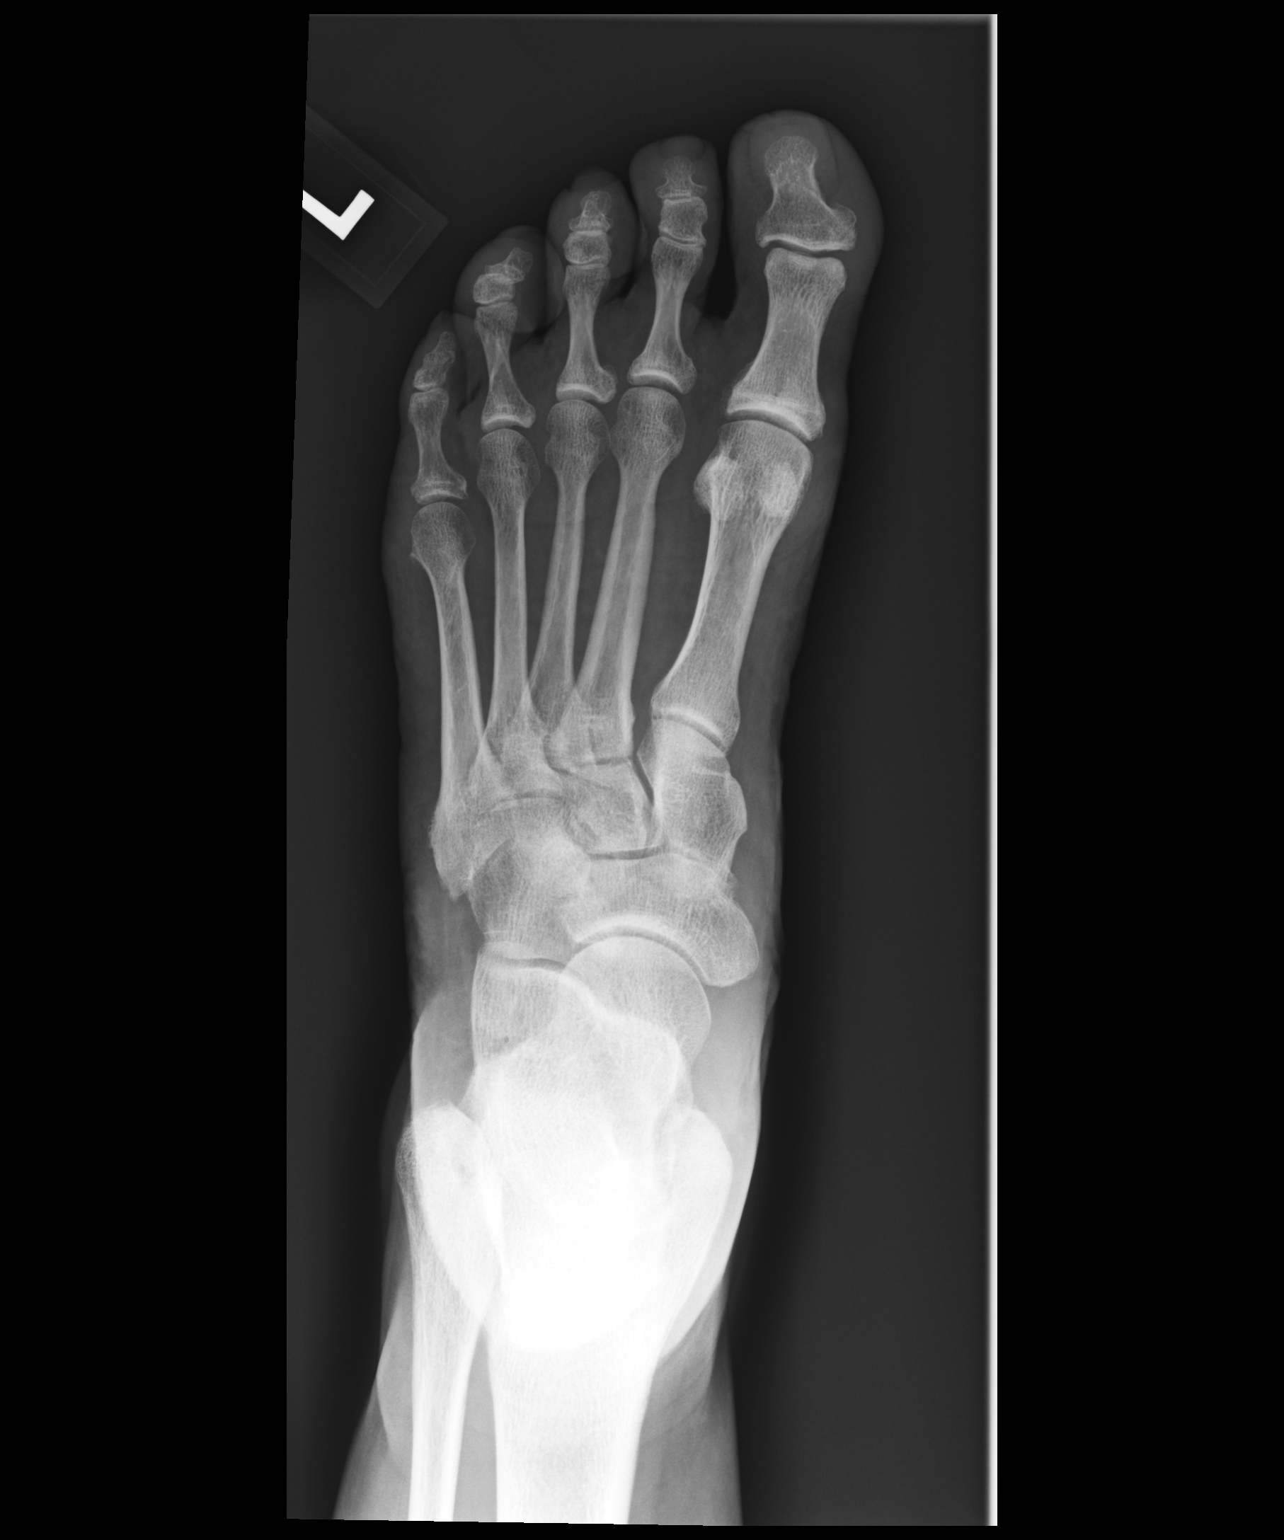

[dg foot complete left (2 of 3)]
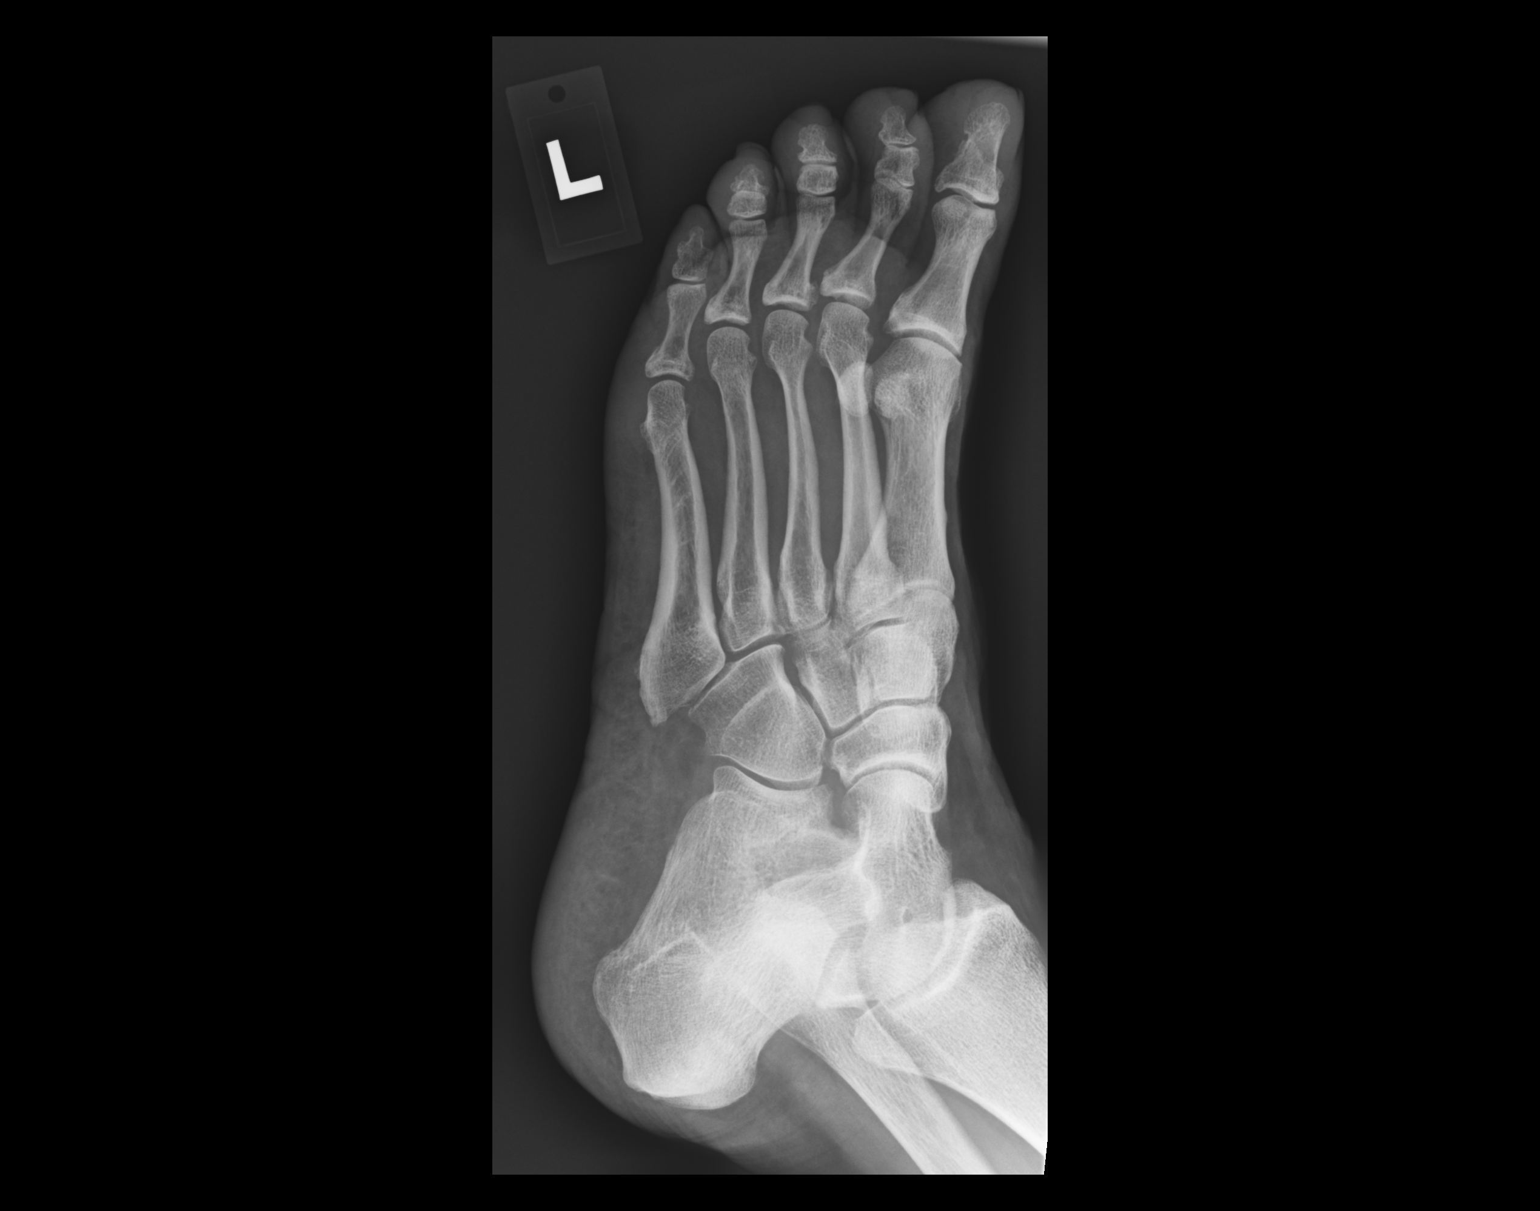

[dg foot complete left (3 of 3)]
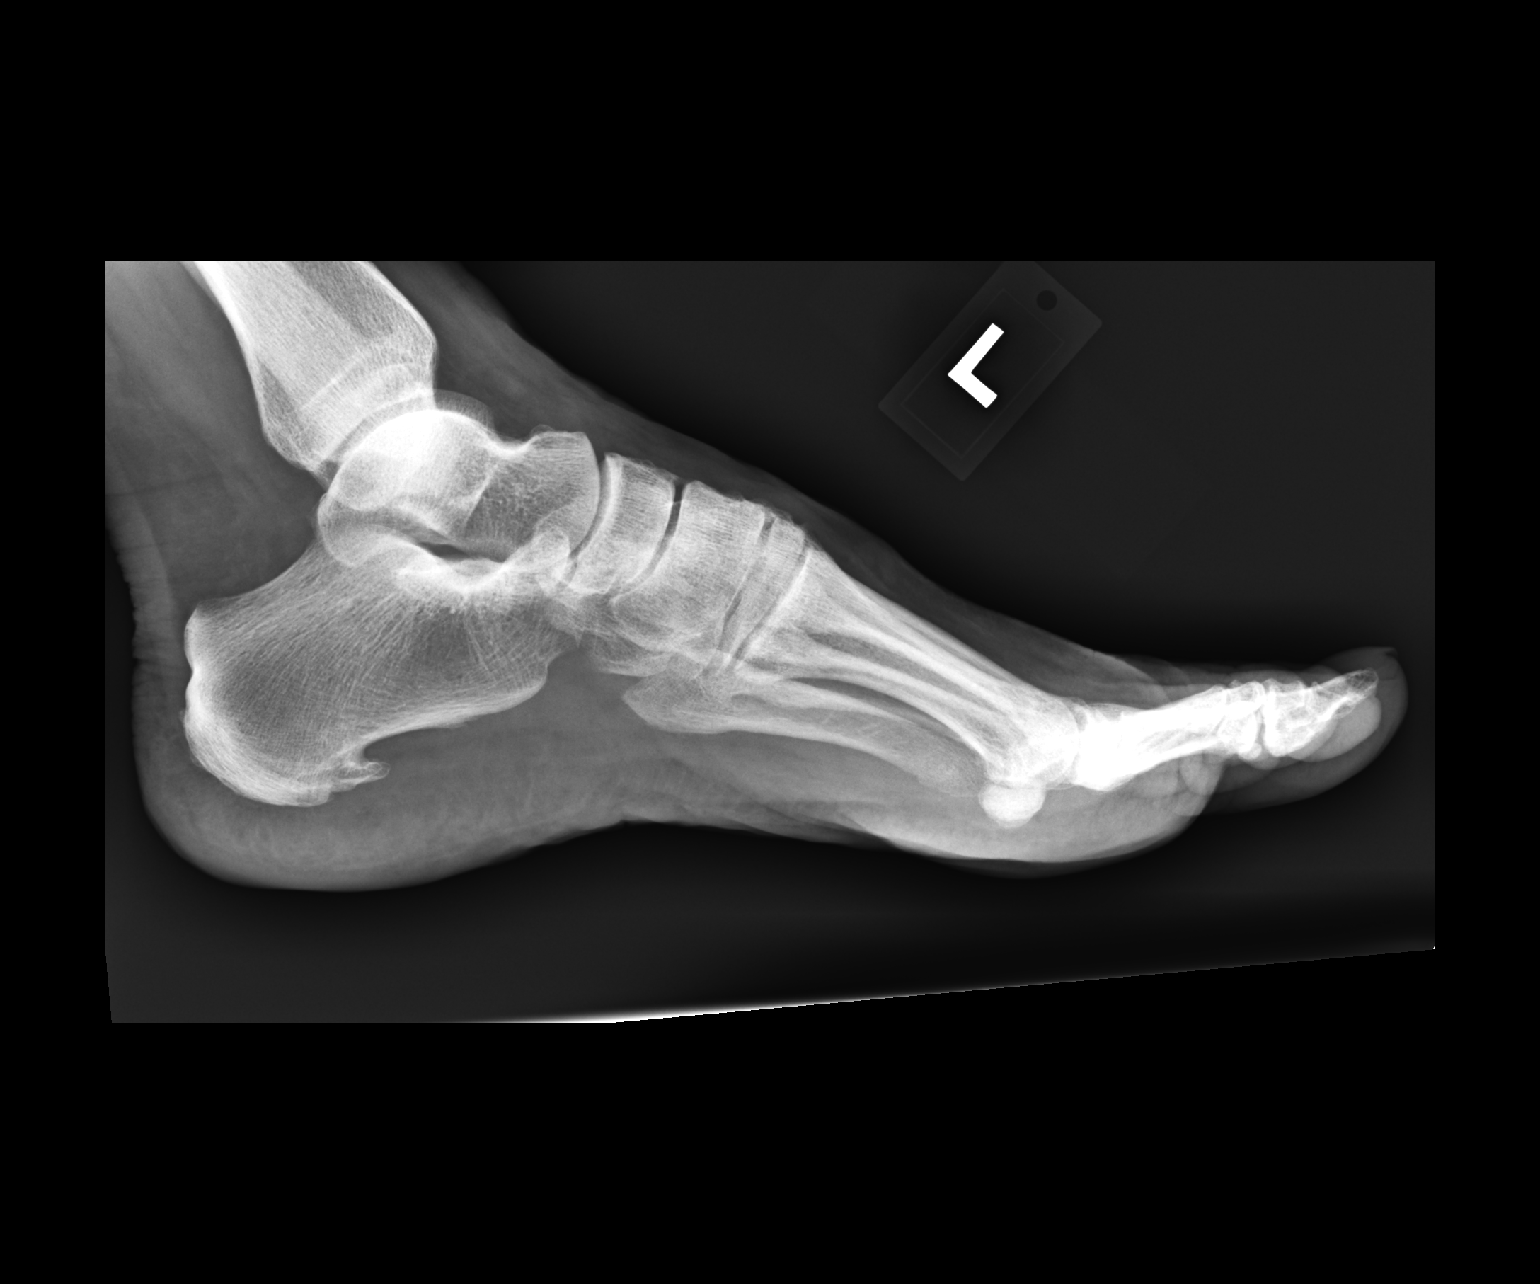

[3 of 3 positions shown; findings below may reference images not displayed]

FINDINGS: The joint spaces are maintained. Minimal degenerative changes. No
acute fracture is identified. Mild Yasoda Robert is noted. Moderate size
calcaneal heel spur.
IMPRESSION: No acute bony findings.

## 2023-03-09 ENCOUNTER — Other Ambulatory Visit: Payer: Self-pay | Admitting: Family Medicine

## 2023-03-09 DIAGNOSIS — M48062 Spinal stenosis, lumbar region with neurogenic claudication: Secondary | ICD-10-CM

## 2023-03-09 DIAGNOSIS — F428 Other obsessive-compulsive disorder: Secondary | ICD-10-CM

## 2023-04-08 ENCOUNTER — Encounter: Payer: Self-pay | Admitting: Family Medicine

## 2023-04-08 ENCOUNTER — Other Ambulatory Visit: Payer: Self-pay | Admitting: Family Medicine

## 2023-04-08 DIAGNOSIS — F428 Other obsessive-compulsive disorder: Secondary | ICD-10-CM

## 2023-04-08 NOTE — Progress Notes (Deleted)
 [facility] [address], [address] [place], [place] [address] [phone]  Date:  04/09/2023   Name:  Kimberly Kent   DOB:  November 27, 1955   MRN:  [id number]  PCP:  Pearline Cables, MD    Chief Complaint: No chief complaint on file.   History of Present Illness:  Kimberly Kent is a 68 y.o. very pleasant female patient who presents with the following:  Patient seen today for follow-up and medication monitoring Most recent visit with myself was about 1 year ago History of persistent cough, hypothyroidism, migraine headache, PFO   She has gynecology care with Dr. 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   Subacute cough 05/30/2021   Arthritis of midfoot 04/17/2021   Left ankle pain 01/16/2021   Rib fracture 12/05/2020   Piriformis syndrome of right side 10/02/2020   Right hamstring muscle strain 06/15/2020   Greater trochanteric bursitis of right hip 04/30/2018   Polyarthralgia 04/03/2018   Degenerative arthritis of right knee 04/03/2018   Lumbar spinal stenosis 04/03/2018   PFO (patent foramen ovale) 01/13/2018   Vitamin D deficiency 03/06/2014   Migraine with aura 05/20/2011   Hypothyroid 05/20/2011   SHORTNESS OF BREATH 12/22/2009   TRANSIENT VISUAL LOSS 07/13/2008    Past Medical History:  Diagnosis Date   Anemia    Anxiety    Arthritis    Blood transfusion without reported diagnosis    Depression    Fibromyalgia 12/29/2013   Hyperplastic colon polyp    Migraines    Neuromuscular disorder (HCC)    Patent foramen ovale    Use filter on all IV fluids  due to PFO   Post-operative nausea and vomiting    Special circumstances    Use filter on all IV tubing due to PFO   Thyroid disease    Vitamin D deficiency     Past Surgical History:  Procedure Laterality Date   ABDOMINAL HYSTERECTOMY     COLONOSCOPY  04/06/2009   ENDOVENOUS ABLATION SAPHENOUS VEIN W/ LASER Left 2010   Approx 2010 per patient, Has LLE EVLT at [facility] in [place], had a reaction to Tumescent??? given during the procedure, could not follow through with procedure on Right leg.   KNEE ARTHROSCOPY     right knee/arthroscopic   TONSILLECTOMY     UPPER GASTROINTESTINAL ENDOSCOPY  2015    Social History   Tobacco Use   Smoking status: Never   Smokeless tobacco: Never  Vaping Use   Vaping status: Never Used  Substance Use Topics   Alcohol use: Yes    Comment: occ   Drug use: No    Family History  Problem Relation Age of Onset   Cancer Mother    Cancer Father    Diabetes Father    Parkinson's disease Maternal Grandmother    Cancer Maternal Grandfather    Colon cancer Neg Hx    Colon polyps Neg Hx    Esophageal cancer Neg Hx    Rectal cancer Neg Hx    Stomach cancer Neg Hx  Allergies  Allergen Reactions   Bupropion Hcl     REACTION: hives   Codeine     REACTION: nausea and vomiting    Medication list has been reviewed and updated.  Current Outpatient Medications on File Prior to Visit  Medication Sig Dispense Refill   FLUoxetine (PROZAC) 10 MG capsule Take 1 capsule (10 mg total) by mouth daily. 30 capsule 0   gabapentin (NEURONTIN) 100 MG capsule Take 2 capsules (200 mg total) by mouth at bedtime. 60 capsule 0   levothyroxine (SYNTHROID) 75 MCG tablet Take 1 tablet (75 mcg total) by mouth daily before breakfast. 30 tablet 0   meloxicam (MOBIC) 15 MG tablet TAKE 1 TABLET (15 MG TOTAL) BY MOUTH DAILY. 90 tablet 1   naproxen (NAPROSYN) 500 MG tablet Take 1 tablet (500 mg total) by mouth 2 (two) times daily as needed. 60 tablet 0    pantoprazole (PROTONIX) 40 MG tablet TAKE 1 TABLET BY MOUTH EVERY DAY 90 tablet 1   No current facility-administered medications on file prior to visit.    Review of Systems:  As per HPI- otherwise negative.   Physical Examination: There were no vitals filed for this visit. There were no vitals filed for this visit. There is no height or weight on file to calculate BMI. Ideal Body Weight:    GEN: no acute distress. HEENT: Atraumatic, Normocephalic.  Ears and Nose: No external deformity. CV: RRR, No M/G/R. No JVD. No thrill. No extra heart sounds. PULM: CTA B, no wheezes, crackles, rhonchi. No retractions. No resp. distress. No accessory muscle use. ABD: S, NT, ND, +BS. No rebound. No HSM. EXTR: No c/c/e PSYCH: Normally interactive. Conversant.    Assessment and Plan: ***  Signed Abbe Amsterdam, MD

## 2023-04-09 ENCOUNTER — Ambulatory Visit: Payer: Medicare Other | Admitting: Family Medicine

## 2023-04-09 DIAGNOSIS — F428 Other obsessive-compulsive disorder: Secondary | ICD-10-CM

## 2023-04-11 ENCOUNTER — Telehealth: Payer: Medicare Other | Admitting: Physician Assistant

## 2023-04-11 DIAGNOSIS — R6889 Other general symptoms and signs: Secondary | ICD-10-CM | POA: Diagnosis not present

## 2023-04-11 MED ORDER — FLUTICASONE PROPIONATE 50 MCG/ACT NA SUSP: 2.0000 | Freq: Every day | NASAL | 0 refills | Status: DC

## 2023-04-11 MED ORDER — PROMETHAZINE-DM 6.25-15 MG/5ML PO SYRP: 5.0000 mL | ORAL_SOLUTION | Freq: Four times a day (QID) | ORAL | 0 refills | Status: DC | PRN

## 2023-04-11 NOTE — Progress Notes (Signed)
E visit for Flu like symptoms   We are sorry that you are not feeling well.  Here is how we plan to help! Based on what you have shared with me it looks like you may have flu-like symptoms that should be watched but do not seem to indicate anti-viral treatment.  Influenza or "the flu" is   an infection caused by a respiratory virus. The flu virus is highly contagious and persons who did not receive their yearly flu vaccination may "catch" the flu from close contact.  We have anti-viral medications to treat the viruses that cause this infection. They are not a "cure" and only shorten the course of the infection. These prescriptions are most effective when they are given within the first 2 days of "flu" symptoms.   Based upon your symptoms and potential risk factors I recommend that you follow the flu symptoms recommendation that I have listed below.  This is an infection that is most likely caused by a virus. There are no specific treatments other than to help you with the symptoms until the infection runs its course.  We are sorry you are not feeling well.  Here is how we plan to help!  For nasal congestion, you may use an oral decongestants such as Mucinex D or if you have glaucoma or high blood pressure use plain Mucinex.  Saline nasal spray or nasal drops can help and can safely be used as often as needed for congestion.  For your congestion, I have prescribed Fluticasone nasal spray one spray in each nostril twice a day  If you do not have a history of heart disease, hypertension, diabetes or thyroid disease, prostate/bladder issues or glaucoma, you may also use Sudafed to treat nasal congestion.  It is highly recommended that you consult with a pharmacist or your primary care physician to ensure this medication is safe for you to take.     If you have a cough, you may use cough suppressants such as Delsym and Robitussin.  If you have glaucoma or high blood pressure, you can also use Coricidin  HBP.   For cough I have prescribed for you Promethazine DM cough syrup Take 5mL every 6 hours as needed for cough.  If you have a sore or scratchy throat, use a saltwater gargle-  to  teaspoon of salt dissolved in a 4-ounce to 8-ounce glass of warm water.  Gargle the solution for approximately 15-30 seconds and then spit.  It is important not to swallow the solution.  You can also use throat lozenges/cough drops and Chloraseptic spray to help with throat pain or discomfort.  Warm or cold liquids can also be helpful in relieving throat pain.  For headache, pain or general discomfort, you can use Ibuprofen or Tylenol as directed.   Some authorities believe that zinc sprays or the use of Echinacea may shorten the course of your symptoms.  HOME CARE Only take medications as instructed by your medical team. Be sure to drink plenty of fluids. Water is fine as well as fruit juices, sodas and electrolyte beverages. You may want to stay away from caffeine or alcohol. If you are nauseated, try taking small sips of liquids. How do you know if you are getting enough fluid? Your urine should be a pale yellow or almost colorless. Get rest. Taking a steamy shower or using a humidifier may help nasal congestion and ease sore throat pain. You can place a towel over your head and breathe in the  steam from hot water coming from a faucet. Using a saline nasal spray works much the same way. Cough drops, hard candies and sore throat lozenges may ease your cough. Avoid close contacts especially the very young and the elderly Cover your mouth if you cough or sneeze Always remember to wash your hands.    ANYONE WHO HAS FLU SYMPTOMS SHOULD: Stay home. The flu is highly contagious and going out or to work exposes others! Be sure to drink plenty of fluids. Water is fine as well as fruit juices, sodas and electrolyte beverages. You may want to stay away from caffeine or alcohol. If you are nauseated, try taking small  sips of liquids. How do you know if you are getting enough fluid? Your urine should be a pale yellow or almost colorless. Get rest. Taking a steamy shower or using a humidifier may help nasal congestion and ease sore throat pain. Using a saline nasal spray works much the same way. Cough drops, hard candies and sore throat lozenges may ease your cough. Line up a caregiver. Have someone check on you regularly.   GET HELP RIGHT AWAY IF: You cannot keep down liquids or your medications. You become short of breath Your fell like you are going to pass out or loose consciousness. Your symptoms persist after you have completed your treatment plan MAKE SURE YOU  Understand these instructions. Will watch your condition. Will get help right away if you are not doing well or get worse.  Your e-visit answers were reviewed by a board certified advanced clinical practitioner to complete your personal care plan.  Depending on the condition, your plan could have included both over the counter or prescription medications.  If there is a problem please reply  once you have received a response from your provider.  Your safety is important to [place].  If you have drug allergies check your prescription carefully.    You can use MyChart to ask questions about today's visit, request a non-urgent call back, or ask for a work or school excuse for 24 hours related to this e-Visit. If it has been greater than 24 hours you will need to follow up with your provider, or enter a new e-Visit to address those concerns.  You will get an e-mail in the next two days asking about your experience.  I hope that your e-visit has been valuable and will speed your recovery. Thank you for using e-visits.   I have spent 5 minutes in review of e-visit questionnaire, review and updating patient chart, medical decision making and response to patient.   Margaretann Loveless, PA-C

## 2023-04-12 ENCOUNTER — Telehealth: Payer: Medicare Other | Admitting: Physician Assistant

## 2023-04-12 ENCOUNTER — Other Ambulatory Visit: Payer: Self-pay | Admitting: Family Medicine

## 2023-04-12 DIAGNOSIS — F428 Other obsessive-compulsive disorder: Secondary | ICD-10-CM

## 2023-04-12 DIAGNOSIS — R6889 Other general symptoms and signs: Secondary | ICD-10-CM

## 2023-04-12 NOTE — Progress Notes (Signed)
  Because you were recently seen for the same symptoms and feel additional treatment is warranted, I feel your condition warrants further evaluation and I recommend that you be seen in a face-to-face visit.     If you are having a true medical emergency, please call 911.     For an urgent face to face visit, [facility] has multiple urgent care centers for your convenience.  Click the link below for the full list of locations and hours, walk-in wait times, appointment scheduling options and driving directions:  Urgent Care - [place], [place], [place], [place], [place], [place]  Kimberly Kent     Your MyChart E-visit questionnaire answers were reviewed by a board certified advanced clinical practitioner to complete your personal care plan based on your specific symptoms.    Thank you for using e-Visits.

## 2023-04-13 NOTE — Progress Notes (Unsigned)
 [facility] [address] [place], [place] [address] [phone]  Date:  04/14/2023   Name:  Kimberly Kent   DOB:  [birth year]   MRN:  [id number]  PCP:  Pearline Cables, MD    Chief Complaint: No chief complaint on file.   History of Present Illness:  Kimberly Kent is a 68 y.o. 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   Subacute cough 05/30/2021   Arthritis of midfoot 04/17/2021   Left ankle pain 01/16/2021   Rib fracture 12/05/2020   Piriformis syndrome of right side 10/02/2020   Right hamstring muscle strain 06/15/2020   Greater trochanteric bursitis of right hip 04/30/2018   Polyarthralgia 04/03/2018   Degenerative arthritis of right knee 04/03/2018   Lumbar spinal stenosis 04/03/2018   PFO (patent foramen ovale) 01/13/2018   Vitamin D deficiency 03/06/2014   Migraine with aura 05/20/2011   Hypothyroid 05/20/2011   SHORTNESS OF BREATH 12/22/2009   TRANSIENT VISUAL LOSS 07/13/2008    Past Medical History:  Diagnosis Date   Anemia    Anxiety    Arthritis    Blood transfusion without reported diagnosis    Depression    Fibromyalgia 12/29/2013   Hyperplastic colon polyp    Migraines    Neuromuscular disorder (HCC)    Patent foramen ovale    Use filter on all IV fluids due to PFO   Post-operative nausea and vomiting    Special circumstances    Use  filter on all IV tubing due to PFO   Thyroid disease    Vitamin D deficiency     Past Surgical History:  Procedure Laterality Date   ABDOMINAL HYSTERECTOMY     COLONOSCOPY  04/06/2009   ENDOVENOUS ABLATION SAPHENOUS VEIN W/ LASER Left 2010   Approx 2010 per patient, Has LLE EVLT at [facility] in [place], had a reaction to Tumescent??? given during the procedure, could not follow through with procedure on Right leg.   KNEE ARTHROSCOPY     right knee/arthroscopic   TONSILLECTOMY     UPPER GASTROINTESTINAL ENDOSCOPY  2015    Social History   Tobacco Use   Smoking status: Never   Smokeless tobacco: Never  Vaping Use   Vaping status: Never Used  Substance Use Topics   Alcohol use: Yes    Comment: occ   Drug use: No    Family History  Problem Relation Age of Onset   Cancer Mother    Cancer Father    Diabetes Father    Parkinson's disease Maternal Grandmother    Cancer Maternal Grandfather    Colon cancer Neg Hx    Colon polyps Neg Hx    Esophageal cancer Neg Hx    Rectal cancer Neg Hx    Stomach cancer Neg Hx     Allergies  Allergen Reactions   Bupropion Hcl  REACTION: hives   Codeine     REACTION: nausea and vomiting    Medication list has been reviewed and updated.  Current Outpatient Medications on File Prior to Visit  Medication Sig Dispense Refill   FLUoxetine (PROZAC) 10 MG capsule Take 1 capsule (10 mg total) by mouth daily. 30 capsule 0   fluticasone (FLONASE) 50 MCG/ACT nasal spray Place 2 sprays into both nostrils daily. 16 g 0   gabapentin (NEURONTIN) 100 MG capsule Take 2 capsules (200 mg total) by mouth at bedtime. 60 capsule 0   levothyroxine (SYNTHROID) 75 MCG tablet Take 1 tablet (75 mcg total) by mouth daily before breakfast. 30 tablet 0   meloxicam (MOBIC) 15 MG tablet TAKE 1 TABLET (15 MG TOTAL) BY MOUTH DAILY. 90 tablet 1   naproxen (NAPROSYN) 500 MG tablet Take 1 tablet (500 mg total) by mouth 2 (two) times daily as needed. 60  tablet 0   pantoprazole (PROTONIX) 40 MG tablet TAKE 1 TABLET BY MOUTH EVERY DAY 90 tablet 1   promethazine-dextromethorphan (PROMETHAZINE-DM) 6.25-15 MG/5ML syrup Take 5 mLs by mouth 4 (four) times daily as needed. 118 mL 0   No current facility-administered medications on file prior to visit.    Review of Systems:  As per HPI- otherwise negative.   Physical Examination: There were no vitals filed for this visit. There were no vitals filed for this visit. There is no height or weight on file to calculate BMI. Ideal Body Weight:    ***  Assessment and Plan: ***  Signed Abbe Amsterdam, MD

## 2023-04-14 ENCOUNTER — Telehealth (INDEPENDENT_AMBULATORY_CARE_PROVIDER_SITE_OTHER): Payer: Medicare Other | Admitting: Family Medicine

## 2023-04-14 DIAGNOSIS — J209 Acute bronchitis, unspecified: Secondary | ICD-10-CM | POA: Diagnosis not present

## 2023-04-14 DIAGNOSIS — F428 Other obsessive-compulsive disorder: Secondary | ICD-10-CM | POA: Diagnosis not present

## 2023-04-14 DIAGNOSIS — E039 Hypothyroidism, unspecified: Secondary | ICD-10-CM | POA: Diagnosis not present

## 2023-04-14 DIAGNOSIS — M48062 Spinal stenosis, lumbar region with neurogenic claudication: Secondary | ICD-10-CM | POA: Diagnosis not present

## 2023-04-14 MED ORDER — DOXYCYCLINE HYCLATE 100 MG PO CAPS
100.0000 mg | ORAL_CAPSULE | Freq: Two times a day (BID) | ORAL | 0 refills | Status: DC
Start: 2023-04-14 — End: 2023-10-01

## 2023-04-14 MED ORDER — FLUOXETINE HCL 10 MG PO CAPS
10.0000 mg | ORAL_CAPSULE | Freq: Every day | ORAL | 3 refills | Status: AC
Start: 2023-04-14 — End: ?

## 2023-04-14 MED ORDER — MELOXICAM 15 MG PO TABS: 15.0000 mg | ORAL_TABLET | Freq: Every day | ORAL | 3 refills | Status: DC

## 2023-04-14 MED ORDER — GABAPENTIN 100 MG PO CAPS: 200.0000 mg | ORAL_CAPSULE | Freq: Every day | ORAL | 1 refills | Status: DC

## 2023-04-14 MED ORDER — LEVOTHYROXINE SODIUM 75 MCG PO TABS
75.0000 ug | ORAL_TABLET | Freq: Every day | ORAL | 3 refills | Status: DC
Start: 2023-04-14 — End: 2023-07-15

## 2023-05-02 LAB — HM MAMMOGRAPHY

## 2023-05-18 ENCOUNTER — Encounter: Payer: Self-pay | Admitting: Family Medicine

## 2023-05-18 MED ORDER — VALACYCLOVIR HCL 1 G PO TABS: ORAL_TABLET | ORAL | 0 refills | Status: DC

## 2023-07-14 ENCOUNTER — Other Ambulatory Visit: Payer: Self-pay | Admitting: Family Medicine

## 2023-07-14 DIAGNOSIS — E039 Hypothyroidism, unspecified: Secondary | ICD-10-CM

## 2023-09-03 ENCOUNTER — Encounter: Payer: Self-pay | Admitting: Family Medicine

## 2023-09-13 ENCOUNTER — Other Ambulatory Visit: Payer: Self-pay | Admitting: Family Medicine

## 2023-09-13 DIAGNOSIS — M48062 Spinal stenosis, lumbar region with neurogenic claudication: Secondary | ICD-10-CM

## 2023-09-28 NOTE — Progress Notes (Unsigned)
 [facility] [address], [address] [place], [place] [address] [phone]  Date:  10/01/2023   Name:  Kimberly Kent   DOB:  [birth year]   MRN:  [id number]  PCP:  Watt Harlene BROCKS, MD    Chief Complaint: No chief complaint on file.   History of Present Illness:  Kimberly Kent is a 68 y.o. 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   Subacute cough 05/30/2021   Arthritis of midfoot 04/17/2021   Left ankle pain 01/16/2021   Rib fracture 12/05/2020   Piriformis syndrome of right side 10/02/2020   Right hamstring muscle strain 06/15/2020   Greater trochanteric bursitis of right hip 04/30/2018   Polyarthralgia 04/03/2018   Degenerative arthritis of right knee 04/03/2018   Lumbar spinal stenosis 04/03/2018   PFO (patent foramen ovale) 01/13/2018   Vitamin D  deficiency 03/06/2014   Migraine with aura 05/20/2011   Hypothyroid 05/20/2011   SHORTNESS OF BREATH 12/22/2009   TRANSIENT VISUAL LOSS 07/13/2008    Past Medical History:  Diagnosis Date   Anemia    Anxiety    Arthritis    Blood transfusion without reported diagnosis    Depression    Fibromyalgia 12/29/2013   Hyperplastic colon polyp    Migraines    Neuromuscular disorder (HCC)    Patent foramen ovale    Use filter on all IV fluids due to PFO   Post-operative nausea and vomiting    Special circumstances    Use filter on all IV tubing due to PFO   Thyroid   disease    Vitamin D  deficiency     Past Surgical History:  Procedure Laterality Date   ABDOMINAL HYSTERECTOMY     COLONOSCOPY  04/06/2009   ENDOVENOUS ABLATION SAPHENOUS VEIN W/ LASER Left 2010   Approx 2010 per patient, Has LLE EVLT at [facility] in [place], had a reaction to Tumescent??? given during the procedure, could not follow through with procedure on Right leg.   KNEE ARTHROSCOPY     right knee/arthroscopic   TONSILLECTOMY     UPPER GASTROINTESTINAL ENDOSCOPY  2015    Social History   Tobacco Use   Smoking status: Never   Smokeless tobacco: Never  Vaping Use   Vaping status: Never Used  Substance Use Topics   Alcohol use: Yes    Comment: occ   Drug use: No    Family History  Problem Relation Age of Onset   Cancer Mother    Cancer Father    Diabetes Father    Parkinson's disease Maternal Grandmother    Cancer Maternal Grandfather    Colon cancer Neg Hx    Colon polyps Neg Hx    Esophageal cancer Neg Hx    Rectal cancer Neg Hx    Stomach cancer Neg Hx     Allergies  Allergen Reactions   Bupropion Hcl     REACTION: hives   Codeine  REACTION: nausea and vomiting    Medication list has been reviewed and updated.  Current Outpatient Medications on File Prior to Visit  Medication Sig Dispense Refill   doxycycline  (VIBRAMYCIN ) 100 MG capsule Take 1 capsule (100 mg total) by mouth 2 (two) times daily. 20 capsule 0   FLUoxetine  (PROZAC ) 10 MG capsule Take 1 capsule (10 mg total) by mouth daily. 90 capsule 3   fluticasone  (FLONASE ) 50 MCG/ACT nasal spray Place 2 sprays into both nostrils daily. 16 g 0   gabapentin  (NEURONTIN ) 100 MG capsule Take 2 capsules (200 mg total) by mouth at bedtime. 180 capsule 1   levothyroxine  (SYNTHROID ) 75 MCG tablet Take 1 tablet (75 mcg total) by mouth daily before breakfast. 30 tablet 0   meloxicam  (MOBIC ) 15 MG tablet Take 1 tablet (15 mg total) by mouth daily. 30 tablet 3   naproxen  (NAPROSYN ) 500 MG tablet Take 1  tablet (500 mg total) by mouth 2 (two) times daily as needed. 60 tablet 0   pantoprazole  (PROTONIX ) 40 MG tablet TAKE 1 TABLET BY MOUTH EVERY DAY 90 tablet 1   promethazine -dextromethorphan (PROMETHAZINE -DM) 6.25-15 MG/5ML syrup Take 5 mLs by mouth 4 (four) times daily as needed. 118 mL 0   valACYclovir  (VALTREX ) 1000 MG tablet Take 2 gm once, repeat 12 hours later. Use as needed for cold sore 20 tablet 0   No current facility-administered medications on file prior to visit.    Review of Systems:  As per HPI- otherwise negative.   Physical Examination: There were no vitals filed for this visit. There were no vitals filed for this visit. There is no height or weight on file to calculate BMI. Ideal Body Weight:    GEN: no acute distress. HEENT: Atraumatic, Normocephalic.  Ears and Nose: No external deformity. CV: RRR, No M/G/R. No JVD. No thrill. No extra heart sounds. PULM: CTA B, no wheezes, crackles, rhonchi. No retractions. No resp. distress. No accessory muscle use. ABD: S, NT, ND, +BS. No rebound. No HSM. EXTR: No c/c/e PSYCH: Normally interactive. Conversant.    Assessment and Plan: ***  Signed Harlene Schroeder, MD

## 2023-09-28 NOTE — Patient Instructions (Incomplete)
It was great to see you again today!  

## 2023-10-01 ENCOUNTER — Encounter: Payer: Self-pay | Admitting: Family Medicine

## 2023-10-01 ENCOUNTER — Ambulatory Visit (INDEPENDENT_AMBULATORY_CARE_PROVIDER_SITE_OTHER): Admitting: Family Medicine

## 2023-10-01 VITALS — BP 128/82 | HR 62 | Ht 66.0 in | Wt 180.0 lb

## 2023-10-01 DIAGNOSIS — Z131 Encounter for screening for diabetes mellitus: Secondary | ICD-10-CM | POA: Diagnosis not present

## 2023-10-01 DIAGNOSIS — Z1329 Encounter for screening for other suspected endocrine disorder: Secondary | ICD-10-CM | POA: Diagnosis not present

## 2023-10-01 DIAGNOSIS — Z13220 Encounter for screening for lipoid disorders: Secondary | ICD-10-CM

## 2023-10-01 DIAGNOSIS — E559 Vitamin D deficiency, unspecified: Secondary | ICD-10-CM | POA: Diagnosis not present

## 2023-10-01 DIAGNOSIS — E2839 Other primary ovarian failure: Secondary | ICD-10-CM

## 2023-10-01 DIAGNOSIS — M791 Myalgia, unspecified site: Secondary | ICD-10-CM | POA: Diagnosis not present

## 2023-10-01 DIAGNOSIS — M48062 Spinal stenosis, lumbar region with neurogenic claudication: Secondary | ICD-10-CM | POA: Diagnosis not present

## 2023-10-01 MED ORDER — MELOXICAM 15 MG PO TABS: 15.0000 mg | ORAL_TABLET | Freq: Every day | ORAL | 3 refills | Status: AC

## 2023-10-01 MED ORDER — GABAPENTIN 100 MG PO CAPS: 200.0000 mg | ORAL_CAPSULE | Freq: Every day | ORAL | 1 refills | Status: AC

## 2023-10-02 ENCOUNTER — Encounter: Payer: Self-pay | Admitting: Family Medicine

## 2023-10-02 DIAGNOSIS — R300 Dysuria: Secondary | ICD-10-CM

## 2023-10-02 LAB — LIPID PANEL
Cholesterol: 258 mg/dL — ABNORMAL HIGH (ref 0–200)
HDL: 60 mg/dL (ref >39.00)
LDL Cholesterol: 158 mg/dL — ABNORMAL HIGH (ref 0–99)
NonHDL: 198.08
Total CHOL/HDL Ratio: 4
Triglycerides: 198 mg/dL — ABNORMAL HIGH (ref 0.0–149.0)
VLDL: 39.6 mg/dL (ref 0.0–40.0)

## 2023-10-02 LAB — VITAMIN D 25 HYDROXY (VIT D DEFICIENCY, FRACTURES): VITD: 20.98 ng/mL — ABNORMAL LOW (ref 30.00–100.00)

## 2023-10-02 LAB — COMPREHENSIVE METABOLIC PANEL WITH GFR
ALT: 13 U/L (ref 0–35)
AST: 18 U/L (ref 0–37)
Albumin: 4.2 g/dL (ref 3.5–5.2)
Alkaline Phosphatase: 68 U/L (ref 39–117)
BUN: 18 mg/dL (ref 6–23)
CO2: 28 meq/L (ref 19–32)
Calcium: 9.2 mg/dL (ref 8.4–10.5)
Chloride: 102 meq/L (ref 96–112)
Creatinine, Ser: 0.69 mg/dL (ref 0.40–1.20)
GFR: 90.92 mL/min (ref >60.00)
Glucose, Bld: 86 mg/dL (ref 70–99)
Potassium: 4 meq/L (ref 3.5–5.1)
Sodium: 142 meq/L (ref 135–145)
Total Bilirubin: 0.5 mg/dL (ref 0.2–1.2)
Total Protein: 6.2 g/dL (ref 6.0–8.3)

## 2023-10-02 LAB — CK: Total CK: 69 U/L (ref 17–177)

## 2023-10-02 LAB — CBC
HCT: 37.7 % (ref 36.0–46.0)
Hemoglobin: 12.7 g/dL (ref 12.0–15.0)
MCHC: 33.7 g/dL (ref 30.0–36.0)
MCV: 93.2 fl (ref 78.0–100.0)
Platelets: 243 K/uL (ref 150.0–400.0)
RBC: 4.05 Mil/uL (ref 3.87–5.11)
RDW: 12.2 % (ref 11.5–15.5)
WBC: 5.6 K/uL (ref 4.0–10.5)

## 2023-10-02 LAB — TSH: TSH: 1.23 u[IU]/mL (ref 0.35–5.50)

## 2023-10-02 LAB — SEDIMENTATION RATE: Sed Rate: 2 mm/h (ref 0–30)

## 2023-10-02 LAB — HEMOGLOBIN A1C: Hgb A1c MFr Bld: 5.5 % (ref 4.6–6.5)

## 2023-10-14 MED ORDER — CEPHALEXIN 500 MG PO CAPS: 500.0000 mg | ORAL_CAPSULE | Freq: Two times a day (BID) | ORAL | 0 refills | Status: AC

## 2023-10-14 NOTE — Addendum Note (Signed)
 Addended by: WATT RAISIN C on: 10/14/2023 07:26 PM   Modules accepted: Orders

## 2023-10-14 NOTE — Addendum Note (Signed)
 Addended by: WATT RAISIN C on: 10/14/2023 07:17 PM   Modules accepted: Orders

## 2023-10-15 ENCOUNTER — Other Ambulatory Visit

## 2023-10-15 DIAGNOSIS — R300 Dysuria: Secondary | ICD-10-CM

## 2023-10-16 LAB — URINE CULTURE
MICRO NUMBER:: 16858440
Result:: NO GROWTH
SPECIMEN QUALITY:: ADEQUATE

## 2023-10-17 ENCOUNTER — Encounter: Payer: Self-pay | Admitting: Family Medicine

## 2023-11-03 ENCOUNTER — Encounter: Payer: Self-pay | Admitting: Family Medicine

## 2023-11-30 ENCOUNTER — Encounter: Payer: Self-pay | Admitting: Family Medicine

## 2023-12-05 NOTE — Progress Notes (Signed)
 Harlene Schroeder, MD [facility] [address] [place], [place] [address] [phone]  Date:  12/10/2023   Name:  Kimberly Kent   DOB:  [birth year]   MRN:  [id number]  PCP:  Schroeder Harlene BROCKS, MD    Chief Complaint: Ear Fullness (3 weeks ago, I was mowing the grass and a bug flew in my ear. I'm not sure if it came out or not/My ear is popping like fluid is in my ear /)   History of Present Illness:  Kimberly Kent is a 68 y.o. very pleasant female patient who presents with the following:  Pt seen today with concern of ear popping Last visit with me was in August  History of persistent cough, hypothyroidism, migraine headache, PFO    Discussed the use of AI scribe software for clinical note transcription with the patient, who gave verbal consent to proceed.  History of Present Illness Kimberly Kent is a 68 year old female who presents with persistent ear popping and sensation of a foreign body in the ear.  Approximately three weeks ago, she experienced an incident where something flew into her ear while she was outside in the yard. Since then, she has had a persistent sensation of ear popping, similar to the feeling of descending from a mountain, and a sensation that something is still in her ear. Despite flushing her ear out a couple of times, the symptoms have persisted. No pain or hearing loss is associated with this sensation.  During the time she was experiencing these symptoms, she contracted COVID-19, which caused her to be out for five days. She felt 'really sick' during this period but did not experience any gastrointestinal symptoms. The ear popping sensation continued throughout her illness and has persisted since then.  She has not used any nasal sprays or other medications specifically for her ear symptoms, but she did take Alka-Seltzer Cold and Flu during her COVID-19 illness. She denies using any nasal  sprays currently.  In terms of her social history, she recently retired from [employer] in May and has since been working two days a week at [employer] [place]. She mentions feeling tired from her work schedule.    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   Subacute cough 05/30/2021   Arthritis of midfoot 04/17/2021   Left ankle pain 01/16/2021   Rib fracture 12/05/2020   Piriformis syndrome of right side 10/02/2020   Right hamstring muscle strain 06/15/2020   Greater trochanteric bursitis of right hip 04/30/2018   Polyarthralgia 04/03/2018   Degenerative arthritis of right knee 04/03/2018   Lumbar spinal stenosis 04/03/2018   PFO (patent foramen ovale) 01/13/2018   Vitamin D  deficiency 03/06/2014   Migraine with aura 05/20/2011   Hypothyroid 05/20/2011   SHORTNESS OF BREATH 12/22/2009   TRANSIENT VISUAL LOSS 07/13/2008    Past Medical History:  Diagnosis Date   Allergy    Many yrs ago   Anemia    Anxiety    Arthritis    Blood transfusion without reported diagnosis    Depression    Fibromyalgia 12/29/2013   Hyperplastic colon polyp  Migraines    Neuromuscular disorder (HCC)    Patent foramen ovale    Use filter on all IV fluids due to PFO   Post-operative nausea and vomiting    Special circumstances    Use filter on all IV tubing due to PFO   Thyroid  disease    Vitamin D  deficiency     Past Surgical History:  Procedure Laterality Date   ABDOMINAL HYSTERECTOMY     COLONOSCOPY  04/06/2009   ENDOVENOUS ABLATION SAPHENOUS VEIN W/ LASER Left 2010   Approx 2010 per patient, Has LLE EVLT at [facility] in [place], had a reaction to Tumescent??? given during the procedure, could not follow  through with procedure on Right leg.   KNEE ARTHROSCOPY     right knee/arthroscopic   TONSILLECTOMY     UPPER GASTROINTESTINAL ENDOSCOPY  2015    Social History   Tobacco Use   Smoking status: Never   Smokeless tobacco: Never  Vaping Use   Vaping status: Never Used  Substance Use Topics   Alcohol use: Yes    Comment: Maybe a social drink once a month at most.   Drug use: No    Family History  Problem Relation Age of Onset   Cancer Mother    Early death Mother    Cancer Father    Diabetes Father    Alcohol abuse Father    Varicose Veins Father    Parkinson's disease Maternal Grandmother    Arthritis Maternal Grandmother    Cancer Maternal Grandfather    Colon cancer Neg Hx    Colon polyps Neg Hx    Esophageal cancer Neg Hx    Rectal cancer Neg Hx    Stomach cancer Neg Hx     Allergies  Allergen Reactions   Bupropion Hcl     REACTION: hives   Codeine     REACTION: nausea and vomiting    Medication list has been reviewed and updated.  Current Outpatient Medications on File Prior to Visit  Medication Sig Dispense Refill   FLUoxetine  (PROZAC ) 10 MG capsule Take 1 capsule (10 mg total) by mouth daily. 90 capsule 3   gabapentin  (NEURONTIN ) 100 MG capsule Take 2 capsules (200 mg total) by mouth at bedtime. 180 capsule 1   levothyroxine  (SYNTHROID ) 75 MCG tablet Take 1 tablet (75 mcg total) by mouth daily before breakfast. 30 tablet 0   meloxicam  (MOBIC ) 15 MG tablet Take 1 tablet (15 mg total) by mouth daily. 90 tablet 3   valACYclovir  (VALTREX ) 1000 MG tablet Take 2 gm once, repeat 12 hours later. Use as needed for cold sore 20 tablet 0   cephALEXin  (KEFLEX ) 500 MG capsule Take 1 capsule (500 mg total) by mouth 2 (two) times daily. (Patient not taking: Reported on 12/10/2023) 14 capsule 0   No current facility-administered medications on file prior to visit.    Review of Systems:  As per HPI- otherwise negative.   Physical Examination: Vitals:   12/10/23  1409  BP: 122/70  Pulse: 60  Temp: 97.9 F (36.6 C)  SpO2: 99%   Vitals:   12/10/23 1409  Weight: 183 lb 3.2 oz (83.1 kg)  Height: 5' 6 (1.676 m)   Body mass index is 29.57 kg/m. Ideal Body Weight: Weight in (lb) to have BMI = 25: 154.6  GEN: no acute distress.  Overweight, looks well  HEENT: Atraumatic, Normocephalic.  Bilateral TM wnl, oropharynx normal.  PEERL,EOMI. both ear canals normal, no foreign body or insect  is detected Ears and Nose: No external deformity. CV: RRR, No M/G/R. No JVD. No thrill. No extra heart sounds. PULM: CTA B, no wheezes, crackles, rhonchi. No retractions. No resp. distress. No accessory muscle use. EXTR: No c/c/e PSYCH: Normally interactive. Conversant.    Assessment and Plan: Dysfunction of left eustachian tube - Plan: predniSONE  (DELTASONE ) 20 MG tablet  Assessment & Plan Eustachian tube dysfunction Eustachian tube dysfunction likely exacerbated by recent COVID-19 infection. Symptoms include popping and cracking sensation in the ear. No evidence of foreign body or infection. - Prescribed nasal steroid spray (Flonase ) for symptom improvement. - Provided prescription for prednisone  if nasal spray is ineffective, noting potential joint pain relief.  Recent COVID-19 infection Eustachian tube dysfunction symptoms may be related to recent infection.  General Health Maintenance Declined flu shot today due to recent COVID-19 infection but plans to receive it later.  Signed Harlene Schroeder, MD

## 2023-12-08 ENCOUNTER — Ambulatory Visit: Admitting: Family Medicine

## 2023-12-10 ENCOUNTER — Ambulatory Visit (INDEPENDENT_AMBULATORY_CARE_PROVIDER_SITE_OTHER): Admitting: Family Medicine

## 2023-12-10 ENCOUNTER — Encounter: Payer: Self-pay | Admitting: Family Medicine

## 2023-12-10 VITALS — BP 122/70 | HR 60 | Temp 97.9°F | Ht 66.0 in | Wt 183.2 lb

## 2023-12-10 DIAGNOSIS — H6992 Unspecified Eustachian tube disorder, left ear: Secondary | ICD-10-CM | POA: Diagnosis not present

## 2023-12-10 MED ORDER — PREDNISONE 20 MG PO TABS: ORAL_TABLET | ORAL | 0 refills | Status: AC

## 2023-12-10 NOTE — Patient Instructions (Addendum)
 Try a nasal steroid spray such as flonase  or nasacort - however if not helpful in a few days take the prednisone  by mouth for 3-5 days Please let me know if this does not help!   Recommend a flu shot AND pneumonia vaccine this fall- you can get both at your pharmacy

## 2024-01-18 ENCOUNTER — Encounter: Payer: Self-pay | Admitting: Family Medicine

## 2024-01-24 ENCOUNTER — Encounter: Payer: Self-pay | Admitting: Family Medicine

## 2024-01-24 DIAGNOSIS — H6992 Unspecified Eustachian tube disorder, left ear: Secondary | ICD-10-CM

## 2024-01-26 ENCOUNTER — Telehealth: Payer: Self-pay | Admitting: Family Medicine

## 2024-01-26 NOTE — Telephone Encounter (Signed)
 Copied from CRM #[id number]. Topic: Medicare AWV >> Jan 26, 2024  9:35 AM Nathanel DEL wrote: Called LVM 01/26/2024 to sched AWV. Please schedule in office or virtual visit.   Nathanel Paschal; Care Guide Ambulatory Clinical Support [place] [facility] Direct Dial: [phone]

## 2024-02-13 ENCOUNTER — Institutional Professional Consult (permissible substitution) (INDEPENDENT_AMBULATORY_CARE_PROVIDER_SITE_OTHER): Admitting: Otolaryngology

## 2024-02-23 ENCOUNTER — Encounter: Payer: Self-pay | Admitting: Family Medicine

## 2024-02-23 MED ORDER — VALACYCLOVIR HCL 1 G PO TABS: ORAL_TABLET | ORAL | 0 refills | Status: AC

## 2024-03-09 ENCOUNTER — Ambulatory Visit (INDEPENDENT_AMBULATORY_CARE_PROVIDER_SITE_OTHER): Admitting: Otolaryngology

## 2024-03-09 ENCOUNTER — Encounter (INDEPENDENT_AMBULATORY_CARE_PROVIDER_SITE_OTHER): Payer: Self-pay | Admitting: Otolaryngology

## 2024-03-09 VITALS — BP 137/82 | HR 60 | Ht 66.0 in | Wt 180.0 lb

## 2024-03-09 DIAGNOSIS — H60542 Acute eczematoid otitis externa, left ear: Secondary | ICD-10-CM

## 2024-03-09 NOTE — Progress Notes (Signed)
 Reason for Consult: Ear issues Referring Physician: Dr. Watt Orie Kimberly Kent is an 69 y.o. female.  HPI: Here for a evaluation of her left ear.  She was mowing the lawn and felt something go into her ear about a month ago.  It was irritating but she was not sure anything ever came out.  She now has a feeling like something is in her ear fairly constantly.  She does not have any hearing loss.  There is no pain.  There is no drainage.  She has not had this previously.  Nothing specific seems to exacerbate it.  Past Medical History:  Diagnosis Date   Allergy    Many yrs ago   Anemia    Anxiety    Arthritis    Blood transfusion without reported diagnosis    Depression    Fibromyalgia 12/29/2013   Hyperplastic colon polyp    Migraines    Neuromuscular disorder (HCC)    Patent foramen ovale    Use filter on all IV fluids due to PFO   Post-operative nausea and vomiting    Special circumstances    Use filter on all IV tubing due to PFO   Thyroid  disease    Vitamin D  deficiency     Past Surgical History:  Procedure Laterality Date   ABDOMINAL HYSTERECTOMY     COLONOSCOPY  04/06/2009   ENDOVENOUS ABLATION SAPHENOUS VEIN W/ LASER Left 2010   Approx 2010 per patient, Has LLE EVLT at [facility] in [place], had a reaction to Tumescent??? given during the procedure, could not follow through with procedure on Right leg.   KNEE ARTHROSCOPY     right knee/arthroscopic   TONSILLECTOMY     UPPER GASTROINTESTINAL ENDOSCOPY  2015    Family History  Problem Relation Age of Onset   Cancer Mother    Early death Mother    Cancer Father    Diabetes Father    Alcohol abuse Father    Varicose Veins Father    Parkinson's disease Maternal Grandmother    Arthritis Maternal Grandmother    Cancer Maternal Grandfather    Colon cancer Neg Hx    Colon polyps Neg Hx    Esophageal cancer Neg Hx    Rectal cancer Neg Hx    Stomach cancer Neg Hx     Social History:  reports that she has never  smoked. She has never used smokeless tobacco. She reports current alcohol use. She reports that she does not use drugs.  Allergies: Allergies[1]   No results found for this or any previous visit (from the past 48 hours).  No results found.  ROS Blood pressure 137/82, pulse 60, height 5' 6 (1.676 m), weight 180 lb (81.6 kg), SpO2 98%. Physical Exam Constitutional:      Appearance: Normal appearance.  HENT:     Head: Normocephalic and atraumatic.     Right Ear: Tympanic membrane is without lesions and middle ear aerated, ear canal and external ear normal.     Left Ear: Tympanic membrane is without lesions and middle ear aerated, see below    Nose: Nose without deviation of septum.  Turbinates with mild hypertrophy, No significant swelling or masses.     Oral cavity/oropharynx: Mucous membranes are moist. No lesions or masses    Larynx: normal voice. Mirror attempted without success    Eyes:     Extraocular Movements: Extraocular movements intact.     Conjunctiva/sclera: Conjunctivae normal.     Pupils: Pupils  are equal, round, and reactive to light.  Cardiovascular:     Rate and Rhythm: Normal rate.  Pulmonary:     Effort: Pulmonary effort is normal.  Musculoskeletal:     Cervical back: Normal range of motion and neck supple. No rigidity.  Lymphadenopathy:     Cervical: No cervical adenopathy or masses.salivary glands without lesions. .     Salivary glands- no mass or swelling Neurological:     Mental Status: He is alert. CN 2-12 intact. No nystagmus   Otomicroscope cleaning  The microscope was used to examine the left ear and there was definite crusting in the superior and posterior aspect of the lateral canal.  This was dried and flaky with a little piece that was banding across the opening.  This was all removed.  She tolerated this well.  There was no lesions.  Tympanic membrane normal.   Assessment/Plan: Crusted debris in left ear canal-she has debris that likely was  causing the sensation she was describing.  I removed it.  There was no lesions or specific pathology at this time.  If she does not resolve her symptoms then she will follow-up.  Norleen Notice 03/09/2024, 9:43 AM        [1]  Allergies Allergen Reactions   Bupropion Hcl     REACTION: hives   Codeine     REACTION: nausea and vomiting

## 2024-03-16 ENCOUNTER — Encounter: Payer: Self-pay | Admitting: Family Medicine

## 2024-03-16 DIAGNOSIS — E039 Hypothyroidism, unspecified: Secondary | ICD-10-CM

## 2024-03-16 MED ORDER — LEVOTHYROXINE SODIUM 75 MCG PO TABS: 75.0000 ug | ORAL_TABLET | Freq: Every day | ORAL | 1 refills | Status: AC
